# Patient Record
Sex: Male | Born: 1955 | Race: White | Hispanic: No | Marital: Married | Smoking: Never smoker
Health system: Southern US, Academic
[De-identification: ages and names within clinical notes are randomized; demographics above are authoritative.]

## PROBLEM LIST (undated history)

## (undated) DIAGNOSIS — I10 Essential (primary) hypertension: Secondary | ICD-10-CM

## (undated) DIAGNOSIS — E785 Hyperlipidemia, unspecified: Secondary | ICD-10-CM

## (undated) DIAGNOSIS — Z8709 Personal history of other diseases of the respiratory system: Secondary | ICD-10-CM

## (undated) DIAGNOSIS — M1990 Unspecified osteoarthritis, unspecified site: Secondary | ICD-10-CM

## (undated) DIAGNOSIS — K5792 Diverticulitis of intestine, part unspecified, without perforation or abscess without bleeding: Secondary | ICD-10-CM

## (undated) DIAGNOSIS — J45909 Unspecified asthma, uncomplicated: Secondary | ICD-10-CM

## (undated) DIAGNOSIS — G4730 Sleep apnea, unspecified: Secondary | ICD-10-CM

## (undated) DIAGNOSIS — A630 Anogenital (venereal) warts: Secondary | ICD-10-CM

## (undated) DIAGNOSIS — T7840XA Allergy, unspecified, initial encounter: Secondary | ICD-10-CM

## (undated) DIAGNOSIS — Z8619 Personal history of other infectious and parasitic diseases: Secondary | ICD-10-CM

## (undated) DIAGNOSIS — C801 Malignant (primary) neoplasm, unspecified: Secondary | ICD-10-CM

## (undated) HISTORY — DX: Essential (primary) hypertension: I10

## (undated) HISTORY — DX: Hyperlipidemia, unspecified: E78.5

## (undated) HISTORY — DX: Unspecified osteoarthritis, unspecified site: M19.90

## (undated) HISTORY — DX: Diverticulitis of intestine, part unspecified, without perforation or abscess without bleeding: K57.92

## (undated) HISTORY — DX: Malignant (primary) neoplasm, unspecified: C80.1

## (undated) HISTORY — DX: Anogenital (venereal) warts: A63.0

## (undated) HISTORY — DX: Unspecified asthma, uncomplicated: J45.909

## (undated) HISTORY — DX: Personal history of other diseases of the respiratory system: Z87.09

## (undated) HISTORY — PX: CARPAL TUNNEL RELEASE: SHX101

## (undated) HISTORY — DX: Sleep apnea, unspecified: G47.30

## (undated) HISTORY — DX: Allergy, unspecified, initial encounter: T78.40XA

## (undated) HISTORY — DX: Personal history of other infectious and parasitic diseases: Z86.19

---

## 2000-12-25 ENCOUNTER — Ambulatory Visit (HOSPITAL_COMMUNITY): Payer: Self-pay

## 2012-09-08 ENCOUNTER — Other Ambulatory Visit (HOSPITAL_COMMUNITY): Payer: Self-pay | Admitting: FAMILY PRACTICE

## 2012-09-17 ENCOUNTER — Ambulatory Visit: Payer: BC Managed Care – PPO | Attending: FAMILY PRACTICE

## 2012-09-17 DIAGNOSIS — R0681 Apnea, not elsewhere classified: Secondary | ICD-10-CM | POA: Insufficient documentation

## 2012-09-17 DIAGNOSIS — G4710 Hypersomnia, unspecified: Secondary | ICD-10-CM | POA: Insufficient documentation

## 2012-09-17 DIAGNOSIS — R0609 Other forms of dyspnea: Secondary | ICD-10-CM | POA: Insufficient documentation

## 2012-09-18 NOTE — Progress Notes (Signed)
PSG Charges Filed

## 2012-09-26 NOTE — Procedures (Signed)
 [place]  [facility]                                         Sleep Laboratory                                           [address]                                    [place], [place] [address]                                          [phone]                                       PATIENT NAME:    Christopher Mcknight, Christopher SR.                   HOSPITAL NUMBER: [id number]  DATE OF BIRTH:   07/05/1956    DATE OF SERVICE: 09/17/2012      September 25, 2012    Alm Lease MD  [address]  [place], [place]  [address]    Dear Dr. Lease:    On the evening of September 17, 2012, Christopher Mcknight reported to the [facility] to undergo an all-night polysomnographic study.  He is a 56 year old, 67-inch, 218-pound male who is being referred in with a history of loud snoring and witnessed apneas.  He also reports symptoms of excessive daytime sleepiness and falling asleep at inappropriate times.  His symptoms have been going on for a number of years.  He has a past medical history of carpal tunnel syndrome, hypertension and allergies.  His medications at the time of the sleep study included lisinopril/HCTZ, etodolac, simvastatin, Claritin, aspirin, vitamin D, vitamin C and a multivitamin.  He does not smoke or drink alcoholic beverages.  Caffeine intake is 3-5 cups a day each of coffee and cola.  Typical bedtime for him is 10:00 p.m. and he usually falls asleep within 10 minutes.  He usually gets out of bed at 4:20 in the morning.  He did not indicate that he takes naps during the day.  He had a score of 12 on the Epworth scale which would indicate moderate problems with sleepiness.  On a routinely administered depression questionnaire, he did not endorse a significant degree of depressive-related symptoms.    Technique:  This polysomnography consisted of the following recorded channels:  Frontal, central and occipital EEG, chin EMG, ROC and  LOC, right and left anterior tibial, EKG, snoring microphone, nasal/oral airflow, thoracic effort, abdominal effort, oxygen saturation and body position.    Study Results:  On the evening of his sleep study, Christopher Mcknight was provided with a 373-minute sleep opportunity of which he actually slept 339 minutes.  Sleep efficiency was 90.9%.  Sleep latency was 21 minutes and REM latency was 50 minutes.  Once asleep, he spent 7.2% of the time in stage N1, 68% stage N2, 0%  stage N3 and 24.7% REM.    There were 22 apneas recorded, of which 19 were obstructive and 3 were central.  There were also 76 obstructive hypopneas recorded.  His apnea-hypopnea index for the evening was moderately elevated at 17.32 events per hour of sleep.  During REM sleep, the index was severely elevated at 51.43 events per hour.  His respiratory events were associated with oxygen desaturations that went down to 78%.  He spent 7.87 minutes with oxygen desaturations that were below 90%.    There were 49 myoclonic events observed that had a periodic quality to their occurrence.  He had a periodic limb movement index of 8.7 events per hour of sleep, which is within normal limits.  In the EKG tracing, sinus rhythm was noted throughout.  Maximum heart rate while asleep was 84 beats per minute and minimum heart rate was 55 beats per minute.  There were no significant EEG abnormalities noted.  No parasomnias or abnormal behaviors were observed.    Clinical Interpretation:  The results of [name] all-night polysomnographic study confirms he does have a significant problem with sleep disordered breathing.  He has a moderately-elevated apnea-hypopnea index for the evening as a whole at 17.32 events per hour of sleep and with his respiratory events, he had significant oxygen desaturations down to 78%.  His REM index was severely elevated at 51.43 events per hour.  On the basis of these findings and in conjunction with his clinical picture, I am  going to suggest that Mr. Mckay return for a trial of positive airway pressure therapy.    Thank you for referring Christopher Mcknight to the [facility] and I will be notifying you of the results of his followup sleep study when it is completed.    Sincerely,        Norleen Salt, MD  Director, Sleep Laboratory  [facility] Department of Neurology and Psychiatry    [id number]; D: 09/25/2012 12:32:02; T: 09/26/2012 05:27:26

## 2012-10-12 ENCOUNTER — Telehealth (HOSPITAL_BASED_OUTPATIENT_CLINIC_OR_DEPARTMENT_OTHER): Payer: Self-pay | Admitting: Neurology

## 2012-10-13 ENCOUNTER — Other Ambulatory Visit (HOSPITAL_COMMUNITY): Payer: Self-pay | Admitting: FAMILY PRACTICE

## 2012-11-06 ENCOUNTER — Ambulatory Visit: Payer: BC Managed Care – PPO | Attending: FAMILY PRACTICE

## 2012-11-06 DIAGNOSIS — G4733 Obstructive sleep apnea (adult) (pediatric): Secondary | ICD-10-CM | POA: Insufficient documentation

## 2012-11-22 NOTE — Procedures (Signed)
[facility]                                         Sleep Laboratory                                           [address]                                    [place], [place] [address]                                          [id number]                                       PATIENT NAME:    Christopher Mcknight, Christopher SR.                   HOSPITAL NUMBER: [id number]  DATE OF BIRTH:   1955/12/01    DATE OF SERVICE: 11/06/2012      November 22, 2012    Blain Pais MD  [address]  [place], [place]  [address]    Dear Dr. Hessie Diener    On the evening of November 06, 2012, Melburn Hake returned to the [facility] to undergo his followup sleep study.  We originally studied him on September 17, 2012, and the results of that sleep study showed an apnea-hypopnea index of 7.32 events per hour of sleep for the evening with a more severe elevation in REM sleep at 51.43 events per hour.  Respiratory events were associated with oxygen desaturations down to 78%.  He is now returning for a trial of positive airway pressure therapy    Technique: This polysomnography consisted of the following recorded channels: Frontal, central and occipital EEG, chin EMG, ROC and LOC, right and left anterior tibial, EKG, snoring microphone, nasal/oral airflow, thoracic effort, abdominal effort, body position, and oxygen saturation.  This study was performed using the 4% oxygen desaturation criterion for the scoring of hypopneas.    STUDY RESULTS:  On the evening of his followup sleep study, Mr. Weiss was provided with a 381-minute sleep opportunity of which he actually slept 332 minutes.  Sleep efficiency was 89.4%.  Sleep latency was 4 minutes and REM latency was 51 minutes.  For the evening, he spent 6.3% of the time in stage N1, 65.7% stage N2, 0% stage N3 and 19.1% REM; 8.9% of time was spent awake.     For the evening, there was a total of 1 central apnea recorded and 6 obstructive hypopneas recorded.  The apnea-hypopnea index for the evening was within normal limits at 1.27 events per hour of sleep.  REM AHI was also normal at 3.45 events per hour.  Lowest oxygen saturation with a respiratory event was 88%.  He was started on CPAP and ended up with a final CPAP pressure setting of 8 cm.  We studied him for 143 minutes on CPAP at 8 cm;  34.2% of the time spent while on CPAP at 8 cm was spent in REM sleep.  The apnea-hypopnea index for the period of time he was on CPAP at 8 cm was 0.47 events per hour of sleep.  No significant oxygen desaturations were noted while on CPAP at 8 cm.    There were no myoclonic events observed that had a periodic quality to their occurrence.  He had a periodic limb movement index of 0.  In the EKG tracing, maximum heart rate while asleep was 79 beats per minute and minimum heart rate was 54 beats per minute.  There were no significant EEG abnormalities noted.  No parasomnias or abnormal behaviors were observed.    CLINICAL INTERPRETATION:  The results of Braedin Dungan's followup sleep study indicate that CPAP at a pressure setting of 8 cm will be effective in normalizing his apnea-hypopnea index.  I am going to prescribe him a CPAP machine with a pressure setting of 8 cm and suggest he have heated humidity and that he use a large Quattro full-face mask.  A followup appointment will be arranged for him in the sleep disorders clinic roughly 1-2 months after he has received his machine so that we can check on his progress.    Thank you for referring Tennis Mckinnon to the [facility] and please do not hesitate to contact me if you have any questions or comments regarding his evaluation.    Sincerely,        Leonard Downing, MD  Director, Sleep Laboratory  [facility] Department of Neurology and Psychiatry     [id number]; D: 11/22/2012 14:53:53; T: 11/22/2012 20:48:16

## 2012-11-23 ENCOUNTER — Encounter (HOSPITAL_BASED_OUTPATIENT_CLINIC_OR_DEPARTMENT_OTHER): Payer: Self-pay | Admitting: Neurology

## 2012-11-24 NOTE — Progress Notes (Signed)
Called pt and read the results of the study done on 11/06/12.  He understood the results and has no questions at this time,  He has no DME of choice but would like to the cpap to use at home.  L Caitland Porchia, RPSGT

## 2012-11-30 ENCOUNTER — Encounter (HOSPITAL_COMMUNITY): Payer: Self-pay | Admitting: FAMILY PRACTICE

## 2012-11-30 NOTE — Progress Notes (Signed)
Pt's CPAP machine was ordered on 11/30/2012 from:        [facility]

## 2013-02-16 ENCOUNTER — Ambulatory Visit (HOSPITAL_BASED_OUTPATIENT_CLINIC_OR_DEPARTMENT_OTHER): Payer: BC Managed Care – PPO | Admitting: Neurology

## 2013-02-16 ENCOUNTER — Encounter (HOSPITAL_BASED_OUTPATIENT_CLINIC_OR_DEPARTMENT_OTHER): Payer: Self-pay | Admitting: Neurology

## 2013-02-16 VITALS — BP 133/91 | HR 64 | Temp 98.3°F | Wt 205.9 lb

## 2013-03-22 ENCOUNTER — Encounter (HOSPITAL_BASED_OUTPATIENT_CLINIC_OR_DEPARTMENT_OTHER): Payer: Self-pay | Admitting: Neurology

## 2013-03-22 NOTE — Progress Notes (Signed)
Christopher Mcknight called [place] today to say that he likes the [employer] FFM  in medium size that he was given to try and wants [place] to order it from his DME [facility]  Shawn Stall

## 2013-03-23 ENCOUNTER — Encounter (HOSPITAL_BASED_OUTPATIENT_CLINIC_OR_DEPARTMENT_OTHER): Payer: BC Managed Care – PPO | Admitting: Neurology

## 2013-03-30 ENCOUNTER — Encounter (HOSPITAL_BASED_OUTPATIENT_CLINIC_OR_DEPARTMENT_OTHER): Payer: BC Managed Care – PPO | Admitting: Neurology

## 2013-04-30 ENCOUNTER — Encounter (HOSPITAL_BASED_OUTPATIENT_CLINIC_OR_DEPARTMENT_OTHER): Payer: BC Managed Care – PPO | Admitting: Neurology

## 2013-05-25 ENCOUNTER — Encounter (HOSPITAL_BASED_OUTPATIENT_CLINIC_OR_DEPARTMENT_OTHER): Payer: Self-pay

## 2013-08-17 ENCOUNTER — Ambulatory Visit (HOSPITAL_BASED_OUTPATIENT_CLINIC_OR_DEPARTMENT_OTHER): Payer: No Typology Code available for payment source | Admitting: Internal Medicine

## 2013-08-17 VITALS — BP 140/90 | HR 83 | Temp 98.2°F | Ht 69.0 in | Wt 210.0 lb

## 2013-08-17 NOTE — Progress Notes (Signed)
[facility]                              DEPARTMENT OF MEDICINE                                [place], [place] [address]                                PATIENT NAME: Christopher Mcknight, Christopher Mcknight.  HOSPITAL UEAVWU:[id number]  DATE OF SERVICE:08/17/2013  DATE OF BIRTH: June 20, 1956    PROGRESS NOTE    August 17, 2013    Blain Pais MD  [address]  [place], [place] [address]    Dear Dr. Hessie Diener:    Mr. Stclair was seen again in the Sleep Disorders Clinic at [facility].  As you recall, he had a sleep study about 18 months ago, which showed an all-night apnea-hypopnea index of 17 events per hour of sleep and the use of CPAP at 8 cm was effective in normalizing his apnea-hypopnea index.  He is seen in followup for this disorder and doing well.      On exam, his blood pressure is 140/90; his height is 5 feet 9 inches tall.  He weighs 210 pounds.  His BMI is 31.  He has no inspiratory crackles or expiratory wheeze.  His cardiac exam is without murmurs, gallop or rub and he has no lower extremity edema.      He works in Event organiser at [employer] and the use of CPAP at 8 cm has improved his nighttime snoring and daytime sleepiness.  We will continue this therapy and see him back in clinic in about a year.      I hope this information is valuable in his management and care.      Sincerely,      Drexel Iha, MD  Professor & Chief, Section of Pulmonary and Critical Care  [place] Department of Medicine    [id number]; D: 08/17/2013 09:35:38; T: 08/17/2013 13:07:00    cc: [facility]       [address] [place], [place] [address]            Blain Pais MD      [address]       [place], [place] [address]

## 2013-08-17 NOTE — Progress Notes (Signed)
 See dictated note.      osa      ICD-9-CM   1. OSA on CPAP 327.23         BP 140/90  Pulse 83  Temp(Src) 36.8 C (98.2 F)  Ht 1.753 m (5' 9)  Wt 95.255 kg (210 lb)  BMI 31 kg/m2  SpO2 98%  Chest clear cor reg        ICD-9-CM   1. OSA on CPAP 327.23       rtc one year    Juelz Claar

## 2013-08-18 ENCOUNTER — Encounter (HOSPITAL_BASED_OUTPATIENT_CLINIC_OR_DEPARTMENT_OTHER): Payer: BC Managed Care – PPO | Admitting: Internal Medicine

## 2013-08-19 ENCOUNTER — Encounter (HOSPITAL_BASED_OUTPATIENT_CLINIC_OR_DEPARTMENT_OTHER): Payer: BC Managed Care – PPO | Admitting: Neurology

## 2013-09-12 ENCOUNTER — Other Ambulatory Visit: Payer: Self-pay

## 2013-10-01 ENCOUNTER — Other Ambulatory Visit (HOSPITAL_COMMUNITY): Payer: Self-pay | Admitting: FAMILY PRACTICE

## 2013-10-20 ENCOUNTER — Other Ambulatory Visit (INDEPENDENT_AMBULATORY_CARE_PROVIDER_SITE_OTHER): Payer: Self-pay

## 2013-10-28 ENCOUNTER — Encounter (INDEPENDENT_AMBULATORY_CARE_PROVIDER_SITE_OTHER): Payer: Self-pay | Admitting: Anesthesiology

## 2013-10-28 ENCOUNTER — Ambulatory Visit: Payer: No Typology Code available for payment source | Attending: Anesthesiology | Admitting: Anesthesiology

## 2013-10-28 VITALS — BP 153/94 | HR 97 | Temp 97.9°F | Resp 20 | Ht 68.35 in | Wt 215.4 lb

## 2013-10-28 DIAGNOSIS — M542 Cervicalgia: Secondary | ICD-10-CM | POA: Insufficient documentation

## 2013-10-28 DIAGNOSIS — M79609 Pain in unspecified limb: Secondary | ICD-10-CM | POA: Insufficient documentation

## 2013-10-28 DIAGNOSIS — M5412 Radiculopathy, cervical region: Secondary | ICD-10-CM | POA: Insufficient documentation

## 2013-10-28 NOTE — Progress Notes (Addendum)
Subjective:     Patient ID:  Christopher Mess Sr. is an 57 y.o. male   Chief Complaint:    Chief Complaint   Patient presents with    Neck Pain     6-7 weeks- no incident- no surgery    Left Arm Pain     numbness and tingling     Patient reporting radicular pain consistent with C6-C7 nerve symptoms. Today he reports the pain is 2/10. 1 week ago he completed an oral prednisone taper and reports significant improvement since that time. Intra-muscular steroids did not work in the past. He reports that he has mild tingling in his 1st 3 fingers at this time, and the aching pain in his arm is almost completely gone.      Patient is a 57 y.o. male presenting with neck pain. The history is provided by the patient.   Neck Pain   This is a new problem. The current episode started more than 1 month ago. The problem occurs constantly. The problem has been gradually improving. The pain is associated with lifting a heavy object. The pain is present in the left side. The quality of the pain is described as aching. The pain is at a severity of 2/10. The pain is mild. Nothing aggravates the symptoms. The pain is same all the time. Associated symptoms include numbness and tingling. He has tried acetaminophen, NSAIDs, chiropractic manipulation and home exercises (intramuscular and oral steroids) for the symptoms. The treatment provided significant relief.       Review of Systems   Musculoskeletal: Positive for neck pain.   Neurological: Positive for tingling and numbness.   All other systems reviewed and are negative.      BP 153/94   Pulse 97   Temp(Src) 36.6 C (97.9 F)   Resp 20   Ht 1.736 m (5' 8.35")   Wt 97.7 kg (215 lb 6.2 oz)   BMI 32.42 kg/m2   SpO2 100%  Past Medical History  Current Outpatient Prescriptions   Medication Sig    ascorbic acid (VITAMIN C) 500 mg Oral Tablet Take 500 mg by mouth Once a day    aspirin (ECOTRIN) 81 mg Oral Tablet, Delayed Release (E.C.) Take 81 mg by mouth Once a day    cholecalciferol,  vitamin D3, 1,000 unit Oral Tablet Take 1,000 Units by mouth Once a day    gabapentin (NEURONTIN) 300 mg Oral Capsule Take 300 mg by mouth    Lisinopril-Hydrochlorothiazide (ZESTORETIC) 10-12.5 mg Oral Tablet Take 1 Tab by mouth Once a day    loratadine (CLARITIN) 10 mg Oral Tablet Take 10 mg by mouth Once a day    multivitamin Oral Tablet Take 1 Tab by mouth Once a day    niacin (NIASPAN) 500 mg Oral Tablet Sustained Release Take 1,000 mg by mouth Once a day    Omega-3 Fatty Acids-Vitamin E (FISH OIL) 1,000 mg Oral Capsule Take 1,000 mg by mouth Twice daily     Allergies   Allergen Reactions    Penicillins      Past Medical History   Diagnosis Date    HTN (hypertension)     Hyperlipidemia      Social History  Works at Research scientist (medical) for [employer]      Objective:   Physical Exam   Constitutional: He is oriented to person, place, and time. He appears well-developed and well-nourished.   HENT:   Head: Normocephalic and atraumatic.   Eyes: Pupils are equal, round,  and reactive to light.   Neck: Normal range of motion.   Pulm:  Effort normal.    Ortho/Musculoskeletal:           Back Exam   Tenderness   The patient is experiencing tenderness in the pain in left arm with palpation of cervical spine area(s).  Range of Motion   Flexion:                Normal  Extension:                Normal  Lateral Bend Left:     Lateral Bend Right:   Rotation Right:          Rotation Left:            Reflexes   Triceps:  4/4  Comments:  Grip strength and DTR's in Bilat UE normal. Paresthesia in left arm produced with palpation of cervical spine at level of C6. No point tenderness  Neurological: He is alert and oriented to person, place, and time.   Skin: Skin is warm and dry.   Psychiatric: He has a normal mood and affect.     .    Assessment & Plan:       ICD-9-CM   1. Neck pain 723.1   2. Cervical radiculopathy at C6 723.4       Pleasant 57yo male with left sided cervical radiculopthy    MRI c-spine reviewed and discussed with  patient  Patient showed significant improvement of radiculopathy with oral steroid taper   He does not need a cervical epidural steroid injection at this particular time  Risks and benefits of continued oral steroid therapy reviewed  Instructions given to patient that if left arm pain should return, he should call Dr. Joylene John clinic directly to be scheduled for cervical epidural steroid injection  Given instructions that he may return to work with no restrictions    Maylon Peppers, MD 10/28/2013, 9:39 AM    See resident/midlevel note for details. I reviewed care and evaluated the patient's management and agree with the resident's/midlevel's findings and plan as written except as noted.      Christopher Comp MD 10/28/2013, 9:39 AM

## 2014-08-23 ENCOUNTER — Encounter (HOSPITAL_BASED_OUTPATIENT_CLINIC_OR_DEPARTMENT_OTHER): Payer: No Typology Code available for payment source | Admitting: Internal Medicine

## 2014-08-24 ENCOUNTER — Encounter (HOSPITAL_BASED_OUTPATIENT_CLINIC_OR_DEPARTMENT_OTHER): Payer: No Typology Code available for payment source | Admitting: Internal Medicine

## 2014-08-25 ENCOUNTER — Other Ambulatory Visit: Payer: Self-pay

## 2014-08-29 ENCOUNTER — Encounter (HOSPITAL_BASED_OUTPATIENT_CLINIC_OR_DEPARTMENT_OTHER): Payer: No Typology Code available for payment source | Admitting: DERMATOLOGY

## 2014-11-08 ENCOUNTER — Encounter (HOSPITAL_BASED_OUTPATIENT_CLINIC_OR_DEPARTMENT_OTHER): Payer: Self-pay | Admitting: DERMATOLOGY

## 2014-11-08 ENCOUNTER — Ambulatory Visit: Payer: No Typology Code available for payment source | Attending: DERMATOLOGY | Admitting: DERMATOLOGY

## 2014-11-08 VITALS — BP 121/71 | Ht 67.68 in | Wt 217.6 lb

## 2014-11-08 DIAGNOSIS — D1800 Hemangioma unspecified site: Secondary | ICD-10-CM | POA: Insufficient documentation

## 2014-11-08 DIAGNOSIS — L570 Actinic keratosis: Secondary | ICD-10-CM | POA: Insufficient documentation

## 2014-11-08 DIAGNOSIS — D229 Melanocytic nevi, unspecified: Secondary | ICD-10-CM | POA: Insufficient documentation

## 2014-11-08 DIAGNOSIS — Z7982 Long term (current) use of aspirin: Secondary | ICD-10-CM | POA: Insufficient documentation

## 2014-11-08 DIAGNOSIS — I10 Essential (primary) hypertension: Secondary | ICD-10-CM | POA: Insufficient documentation

## 2014-11-08 NOTE — Procedures (Signed)
See progress note.   Christopher Bost [name], MD

## 2014-11-08 NOTE — Progress Notes (Signed)
Subjective:       Patient ID: Christopher Jarred Sr. is a 58 y.o. male     Chief Complaint:     Chief Complaint   Patient presents with    Skin Check     spots on face        HPI  Here to have lesions on face checked.  No bleeding or painful lesions.  No personal or family hx skin cancer.  Careful in the sun    Current Outpatient Prescriptions   Medication Sig    ascorbic acid (VITAMIN C) 500 mg Oral Tablet Take 500 mg by mouth Once a day    aspirin (ECOTRIN) 81 mg Oral Tablet, Delayed Release (E.C.) Take 81 mg by mouth Once a day    atorvastatin (LIPITOR) 20 mg Oral Tablet Take 20 mg by mouth Once a day    cholecalciferol, vitamin D3, 1,000 unit Oral Tablet Take 1,000 Units by mouth Once a day    gabapentin (NEURONTIN) 300 mg Oral Capsule Take 300 mg by mouth    Lisinopril-Hydrochlorothiazide (ZESTORETIC) 10-12.5 mg Oral Tablet Take 1 Tab by mouth Once a day    Lisinopril-Hydrochlorothiazide (ZESTORETIC) 20-12.5 mg Oral Tablet Take 1 Tab by mouth Twice daily    loratadine (CLARITIN) 10 mg Oral Tablet Take 10 mg by mouth Once a day    multivitamin Oral Tablet Take 1 Tab by mouth Once a day    niacin (NIASPAN) 500 mg Oral Tablet Sustained Release Take 1,000 mg by mouth Once a day    Omega-3 Fatty Acids-Vitamin E (FISH OIL) 1,000 mg Oral Capsule Take 1,000 mg by mouth Twice daily       Review of Systems   Constitutional: Negative for fever.   Cardiovascular: Negative for chest pain.   Gastrointestinal: Negative for diarrhea.       Objective:   Marland Kitchen BP 121/71 mmHg   Ht 1.719 m (5' 7.68")   Wt 98.7 kg (217 lb 9.5 oz)   BMI 33.40 kg/m2    Physical Exam   Constitutional: He is oriented to person, place, and time. He appears well-developed and well-nourished. No distress.   HENT:   Head: Normocephalic and atraumatic.   Neurological: He is alert and oriented to person, place, and time.   Skin: Skin is warm and dry. He is not diaphoretic.        Psychiatric: He has a normal mood and affect.   General skin exam was  performed including head, neck, anterior/posterior trunk, bilateral upper & lower extremities and revealed no areas of concern other than those documented.      Assessment & Plan:       ICD-10-CM    1. Actinic keratosis L57.0 DESTRUCTION OF LESIONS (CRYO/EXC) PRE-MALIG (AMB ONLY)   2. Angioma D18.00    3. Nevus D22.9    4. Essential hypertension I10            Plan  Procedure:  Freeze thaw freeze AK right cheekLesion(s) treated with freeze/thaw/refreeze with liquid nitrogen.  Patient was informed of the risks of the procedure including pain, irritation, blister, hypopigmentation, hyperpigmentation, and recurrence. Patient understood the risks and agrees with the procedure.    Follow  The patient was educated on the importance of avoiding excessive sun exposure and wearing sunscreen daily.  Advised patient to re-apply sunscreen every 2-3 hours.  Advised the patient to avoid going to the tanning beds.  Advised to check skin routinely for any changes, especially any new moles  or changes in existing moles.  Mauri Reading, MD

## 2015-02-01 ENCOUNTER — Ambulatory Visit: Payer: No Typology Code available for payment source | Attending: DERMATOLOGY | Admitting: DERMATOLOGY

## 2015-02-01 ENCOUNTER — Encounter (HOSPITAL_BASED_OUTPATIENT_CLINIC_OR_DEPARTMENT_OTHER): Payer: Self-pay | Admitting: DERMATOLOGY

## 2015-02-01 VITALS — BP 128/78 | Ht 68.15 in | Wt 215.2 lb

## 2015-02-01 DIAGNOSIS — Z7982 Long term (current) use of aspirin: Secondary | ICD-10-CM | POA: Insufficient documentation

## 2015-02-01 DIAGNOSIS — L570 Actinic keratosis: Secondary | ICD-10-CM | POA: Insufficient documentation

## 2015-02-01 DIAGNOSIS — A630 Anogenital (venereal) warts: Secondary | ICD-10-CM | POA: Insufficient documentation

## 2015-02-01 DIAGNOSIS — D229 Melanocytic nevi, unspecified: Secondary | ICD-10-CM | POA: Insufficient documentation

## 2015-02-01 DIAGNOSIS — Z6832 Body mass index (BMI) 32.0-32.9, adult: Secondary | ICD-10-CM | POA: Insufficient documentation

## 2015-02-01 MED ORDER — IMIQUIMOD 5 % TOPICAL CREAM PACKET
1.00 | TOPICAL_CREAM | CUTANEOUS | Status: AC
Start: 2015-02-01 — End: ?

## 2015-02-01 NOTE — Procedures (Signed)
See progress note.   Christopher Mcknight L [name], MD

## 2015-02-01 NOTE — Progress Notes (Signed)
Subjective:       Patient ID: Christopher Jarred Sr. is a 59 y.o. male     Chief Complaint:     Chief Complaint   Patient presents with    Skin Check     genital warts        HPI    Here to have genital warts checked.  First appeared about 5 months ago.  PCP has frozen and treated with podophylin.  Now on imiquamod for about 2 months.  Some have resolved.  No new bleeding or painful lesions.Hx AKs.  No personal or family hx skin cancer.  Careful in the sun    Current Outpatient Prescriptions   Medication Sig    ascorbic acid (VITAMIN C) 500 mg Oral Tablet Take 500 mg by mouth Once a day    aspirin (ECOTRIN) 81 mg Oral Tablet, Delayed Release (E.C.) Take 81 mg by mouth Once a day    atorvastatin (LIPITOR) 20 mg Oral Tablet Take 20 mg by mouth Once a day    cholecalciferol, vitamin D3, 1,000 unit Oral Tablet Take 1,000 Units by mouth Once a day    gabapentin (NEURONTIN) 300 mg Oral Capsule Take 300 mg by mouth    Imiquimod 5 % Cream in Packet Apply 1 Applicator topically Every Monday, Wednesday and Friday apply a thin layer as directed    Lisinopril-Hydrochlorothiazide (ZESTORETIC) 10-12.5 mg Oral Tablet Take 1 Tab by mouth Once a day    Lisinopril-Hydrochlorothiazide (ZESTORETIC) 20-12.5 mg Oral Tablet Take 1 Tab by mouth Twice daily    loratadine (CLARITIN) 10 mg Oral Tablet Take 10 mg by mouth Once a day    multivitamin Oral Tablet Take 1 Tab by mouth Once a day    niacin (NIASPAN) 500 mg Oral Tablet Sustained Release Take 1,000 mg by mouth Once a day    Omega-3 Fatty Acids-Vitamin E (FISH OIL) 1,000 mg Oral Capsule Take 1,000 mg by mouth Twice daily       Review of Systems   Constitutional: Negative for fever.   Cardiovascular: Negative for chest pain.   Gastrointestinal: Negative for diarrhea.       Objective:   Marland Kitchen   BP 128/78 mmHg   Ht 1.731 m (5' 8.15")   Wt 97.6 kg (215 lb 2.7 oz)   BMI 32.57 kg/m2    Physical Exam   Constitutional: He is oriented to person, place, and time. He appears  well-developed and well-nourished. No distress.   HENT:   Head: Normocephalic and atraumatic.   Neurological: He is alert and oriented to person, place, and time.   Skin: Skin is warm and dry. He is not diaphoretic.        Psychiatric: He has a normal mood and affect.   General skin exam was performed including head, neck, anterior/posterior trunk, bilateral upper & lower extremities and revealed no areas of concern other than those documented.      Assessment & Plan:       ICD-10-CM    1. Genital warts A63.0 GENITAL WARTS (CRYO) ANAL-SIMPLE (AMB ONLY)   2. Actinic keratosis L57.0    3. Nevus D22.9            Plan    Procedure:  Freeze thaw freeze 6 warts Lesion(s) treated with freeze/thaw/refreeze with liquid nitrogen.  Patient was informed of the risks of the procedure including pain, irritation, blister, hypopigmentation, hyperpigmentation, and recurrence. Patient understood the risks and agrees with the procedure.  Continue imiquamod  Follow  The patient was educated on the importance of avoiding excessive sun exposure and wearing sunscreen daily.  Advised patient to re-apply sunscreen every 2-3 hours.  Advised the patient to avoid going to the tanning beds.  Advised to check skin routinely for any changes, especially any new moles or changes in existing moles.  Mauri Reading, MD

## 2015-05-16 ENCOUNTER — Encounter (HOSPITAL_BASED_OUTPATIENT_CLINIC_OR_DEPARTMENT_OTHER): Payer: No Typology Code available for payment source | Admitting: DERMATOLOGY

## 2015-10-22 ENCOUNTER — Other Ambulatory Visit: Payer: Self-pay

## 2016-09-09 ENCOUNTER — Ambulatory Visit: Payer: Self-pay | Admitting: Family Medicine

## 2016-09-18 DIAGNOSIS — K5792 Diverticulitis of intestine, part unspecified, without perforation or abscess without bleeding: Secondary | ICD-10-CM

## 2016-09-18 HISTORY — DX: Diverticulitis of intestine, part unspecified, without perforation or abscess without bleeding: K57.92

## 2016-09-20 DIAGNOSIS — K5792 Diverticulitis of intestine, part unspecified, without perforation or abscess without bleeding: Secondary | ICD-10-CM | POA: Diagnosis not present

## 2016-09-20 DIAGNOSIS — R1032 Left lower quadrant pain: Secondary | ICD-10-CM | POA: Diagnosis not present

## 2016-09-26 ENCOUNTER — Telehealth: Payer: Self-pay | Admitting: *Deleted

## 2016-09-26 ENCOUNTER — Encounter: Payer: Self-pay | Admitting: *Deleted

## 2016-09-26 NOTE — Telephone Encounter (Signed)
Pre-Visit Call completed with patient and chart updated.   Pre-Visit Info documented in Specialty Comments under [employer].    

## 2016-09-27 ENCOUNTER — Encounter: Payer: Self-pay | Admitting: Family Medicine

## 2016-09-27 ENCOUNTER — Ambulatory Visit (INDEPENDENT_AMBULATORY_CARE_PROVIDER_SITE_OTHER): Payer: BLUE CROSS/BLUE SHIELD | Admitting: Family Medicine

## 2016-09-27 ENCOUNTER — Ambulatory Visit: Payer: Self-pay | Admitting: Family Medicine

## 2016-09-27 VITALS — BP 122/76 | HR 62 | Temp 98.1°F | Ht 68.0 in | Wt 221.4 lb

## 2016-09-27 DIAGNOSIS — Z1211 Encounter for screening for malignant neoplasm of colon: Secondary | ICD-10-CM | POA: Diagnosis not present

## 2016-09-27 DIAGNOSIS — K5732 Diverticulitis of large intestine without perforation or abscess without bleeding: Secondary | ICD-10-CM

## 2016-09-27 DIAGNOSIS — Z125 Encounter for screening for malignant neoplasm of prostate: Secondary | ICD-10-CM | POA: Diagnosis not present

## 2016-09-27 DIAGNOSIS — Z131 Encounter for screening for diabetes mellitus: Secondary | ICD-10-CM

## 2016-09-27 DIAGNOSIS — I10 Essential (primary) hypertension: Secondary | ICD-10-CM | POA: Diagnosis not present

## 2016-09-27 DIAGNOSIS — E785 Hyperlipidemia, unspecified: Secondary | ICD-10-CM

## 2016-09-27 HISTORY — DX: Hyperlipidemia, unspecified: E78.5

## 2016-09-27 LAB — LIPID PANEL
CHOLESTEROL: 151 mg/dL (ref 0–200)
HDL: 39.5 mg/dL (ref >39.00)
LDL Cholesterol: 88 mg/dL (ref 0–99)
NonHDL: 111.91
TRIGLYCERIDES: 122 mg/dL (ref 0.0–149.0)
Total CHOL/HDL Ratio: 4
VLDL: 24.4 mg/dL (ref 0.0–40.0)

## 2016-09-27 LAB — MICROALBUMIN / CREATININE URINE RATIO
Creatinine,U: 166.2 mg/dL
Microalb Creat Ratio: 0.4 mg/g (ref 0.0–30.0)

## 2016-09-27 LAB — COMPREHENSIVE METABOLIC PANEL
ALBUMIN: 4.3 g/dL (ref 3.5–5.2)
ALK PHOS: 37 U/L — AB (ref 39–117)
ALT: 31 U/L (ref 0–53)
AST: 31 U/L (ref 0–37)
BUN: 14 mg/dL (ref 6–23)
CALCIUM: 9.8 mg/dL (ref 8.4–10.5)
CO2: 31 mEq/L (ref 19–32)
Chloride: 104 mEq/L (ref 96–112)
Creatinine, Ser: 0.95 mg/dL (ref 0.40–1.50)
GFR: 86.11 mL/min (ref >60.00)
Glucose, Bld: 93 mg/dL (ref 70–99)
POTASSIUM: 4.2 meq/L (ref 3.5–5.1)
Sodium: 143 mEq/L (ref 135–145)
TOTAL PROTEIN: 6.7 g/dL (ref 6.0–8.3)
Total Bilirubin: 0.7 mg/dL (ref 0.2–1.2)

## 2016-09-27 LAB — HEMOGLOBIN A1C: Hgb A1c MFr Bld: 5.6 % (ref 4.6–6.5)

## 2016-09-27 LAB — PSA: PSA: 4.86 ng/mL — AB (ref 0.10–4.00)

## 2016-09-27 NOTE — Progress Notes (Signed)
Pre visit review using our clinic review tool, if applicable. No additional management support is needed unless otherwise documented below in the visit note. 

## 2016-09-27 NOTE — Progress Notes (Signed)
Chief Complaint  Patient presents with  . Establish Care    Pt states x1 week having discomfort in abdomen and went to urgent care and was diagnosed with diverticulitis        New Patient Visit SUBJECTIVE: HPI: Sean Bowers is an 60 y.o.male who is being seen for establishing care.  The patient was previously seen at an office in [place].   Seen at urgent care 1 week ago for LLQ abd pain. He was dx'd and treated for diverticulitis. His last day of abx, Flagyl and Cipro, is today. He is 80% better, but still has some LLQ discomfort. States his stool is a little darker than usual, he is not having any blood or tarry colored stools. He has never had a colonoscopy. No longer having fevers.  Hypertension Patient presents for hypertension follow up. He does not monitor home blood pressures. He is compliant with medications. On Prinzide 20-25 mg daily. Patient has these side effects of medication: none He specifically denies headache, visual changes, chest pain, palpitations, dyspnea, orthopnea, PND or peripheral edema. He is adhering to a low sodium and low fat diet.   Allergies  Allergen Reactions  . Penicillins Swelling    Swelling of lips    Past Medical History:  Diagnosis Date  . Arthritis   . Asthma   . Diverticulitis   . Genital warts   . History of chicken pox   . History of hay fever   . Hyperlipidemia   . Hyperlipidemia 09/27/2016  . Hypertension    Past Surgical History:  Procedure Laterality Date  . CARPAL TUNNEL RELEASE Bilateral 2006-2007   Social History   Social History  . Marital status: Married   Social History Main Topics  . Smoking status: Never Smoker  . Smokeless tobacco: Never Used  . Alcohol use No  . Drug use: No   Family History  Problem Relation Age of Onset  . Diabetes Mother   . Heart disease Mother   . Hypertension Mother   . Breast cancer Mother   . Arthritis Mother   . Cancer Mother     breast  . Hyperlipidemia Mother    . Heart disease Father   . Stroke Father   . Arthritis Father   . Cancer Father     prostate  . Hyperlipidemia Father   . Heart disease Paternal Grandmother   . Cancer Maternal Uncle     lung  . Cancer Paternal Aunt     lung  . Cancer Paternal Uncle     lung     Current Outpatient Prescriptions:  .  aspirin 81 MG tablet, Take 81 mg by mouth daily., Disp: , Rfl:  .  Chlorpheniramine Maleate (ALLERGY PO), Take by mouth as needed., Disp: , Rfl:  .  lisinopril-hydrochlorothiazide (PRINZIDE,ZESTORETIC) 20-25 MG tablet, Take 1 tablet by mouth daily., Disp: , Rfl:  .  Multiple Vitamins-Minerals (MULTIVITAMIN MEN PO), Take by mouth., Disp: , Rfl:  .  pravastatin (PRAVACHOL) 40 MG tablet, Take 40 mg by mouth daily., Disp: , Rfl:   ROS Cardiovascular: Denies chest pain  Respiratory: Denies dyspnea   OBJECTIVE: BP 122/76 (BP Location: Left Arm, Patient Position: Sitting, Cuff Size: Large)   Pulse 62   Temp 98.1 F (36.7 C) (Oral)   Ht 5\' 8"  (1.727 m)   Wt 221 lb 6.4 oz (100.4 kg)   SpO2 97%   BMI 33.66 kg/m   Constitutional: -  VS reviewed -  Well developed,  well nourished, appears stated age -  No apparent distress  Psychiatric: -  Oriented to person, place, and time -  Memory intact -  Affect and mood normal -  Fluent conversation, good eye contact -  Judgment and insight age appropriate  Eye: -  Conjunctivae clear, no discharge -  Pupils symmetric, round, reactive to light  ENMT: -  Oral mucosa without lesions, tongue and uvula midline    Tonsils not enlarged, no erythema, no exudate, trachea midline    Pharynx moist, no lesions, no erythema  Neck: -  No gross swelling, no palpable masses -  Thyroid midline, not enlarged, mobile, no palpable masses  Cardiovascular: -  RRR, no murmurs -  No LE edema  Respiratory: -  Normal respiratory effort, no accessory muscle use, no retraction -  Breath sounds equal, no wheezes, no ronchi, no crackles  Gastrointestinal: -  Bowel  sounds normal -  TTP in LLQ, no distention, no guarding, no masses  Musculoskeletal: -  No clubbing, no cyanosis -  Gait normal  Skin: -  No significant lesion on inspection -  Warm and dry to palpation   ASSESSMENT/PLAN: Essential hypertension - Plan: Comprehensive metabolic panel, Microalbumin / creatinine urine ratio  Hyperlipidemia, unspecified hyperlipidemia type - Plan: Comprehensive metabolic panel, Lipid panel  Diverticulitis of colon without hemorrhage  Special screening for malignant neoplasms, colon - Plan: Ambulatory referral to Gastroenterology  Screening for diabetes mellitus - Plan: Hemoglobin A1c  Screening for prostate cancer - Plan: PSA  Patient instructed to sign release of records form from his previous PCP. Orders as above. Progressing nicely on abx. Should see GI for CCS. Patient should return in 6 mo to recheck HTN. The patient voiced understanding and agreement to the plan.   [place], DO 09/27/16  8:27 AM

## 2016-10-02 ENCOUNTER — Telehealth: Payer: Self-pay | Admitting: Family Medicine

## 2016-10-02 ENCOUNTER — Encounter: Payer: Self-pay | Admitting: Gastroenterology

## 2016-10-02 NOTE — Telephone Encounter (Signed)
Caller name:Alyan Hanzlik Relationship to patient: Can be reached: Pharmacy:  Reason for call:Requesting lab results be released to [facility]

## 2016-10-17 NOTE — Telephone Encounter (Signed)
Outside records reviewed. Lab of note was a one time low reading of free testosterone with a low-normal total T 04/2014. Recheck on 09/2014 revealed T in normal range. Records do not show that he received treatment.

## 2016-11-03 ENCOUNTER — Other Ambulatory Visit: Payer: Self-pay

## 2016-11-26 ENCOUNTER — Ambulatory Visit (AMBULATORY_SURGERY_CENTER): Payer: Self-pay

## 2016-11-26 VITALS — Ht 68.0 in | Wt 222.6 lb

## 2016-11-26 DIAGNOSIS — Z1211 Encounter for screening for malignant neoplasm of colon: Secondary | ICD-10-CM

## 2016-11-26 MED ORDER — NA SULFATE-K SULFATE-MG SULF 17.5-3.13-1.6 GM/177ML PO SOLN: ORAL | 0 refills | Status: DC

## 2016-11-26 NOTE — Progress Notes (Signed)
Per pt, no allergies to soy or egg products.Pt not taking any weight loss meds or using  O2 at home. 

## 2016-12-02 ENCOUNTER — Encounter: Payer: Self-pay | Admitting: Gastroenterology

## 2016-12-10 ENCOUNTER — Ambulatory Visit (AMBULATORY_SURGERY_CENTER): Payer: BLUE CROSS/BLUE SHIELD | Admitting: Gastroenterology

## 2016-12-10 ENCOUNTER — Encounter: Payer: Self-pay | Admitting: Gastroenterology

## 2016-12-10 VITALS — BP 130/82 | HR 82 | Temp 98.6°F | Resp 16 | Ht 68.0 in | Wt 222.0 lb

## 2016-12-10 DIAGNOSIS — Z1212 Encounter for screening for malignant neoplasm of rectum: Secondary | ICD-10-CM | POA: Diagnosis not present

## 2016-12-10 DIAGNOSIS — K635 Polyp of colon: Secondary | ICD-10-CM

## 2016-12-10 DIAGNOSIS — Z1211 Encounter for screening for malignant neoplasm of colon: Secondary | ICD-10-CM

## 2016-12-10 DIAGNOSIS — D122 Benign neoplasm of ascending colon: Secondary | ICD-10-CM

## 2016-12-10 DIAGNOSIS — D120 Benign neoplasm of cecum: Secondary | ICD-10-CM

## 2016-12-10 MED ORDER — SODIUM CHLORIDE 0.9 % IV SOLN: 500.0000 mL | INTRAVENOUS | Status: DC

## 2016-12-10 NOTE — Progress Notes (Signed)
Called to room to assist during endoscopic procedure.  Patient ID and intended procedure confirmed with present staff. Received instructions for my participation in the procedure from the performing physician.  

## 2016-12-10 NOTE — Progress Notes (Signed)
Spontaneous respirations throughout. VSS. Resting comfortably. To PACU on room air. Report to  Celia RN. 

## 2016-12-10 NOTE — Patient Instructions (Signed)
Discharge instructions given. Handouts on polyps,diverticulosis and hemorrhoids. No ibuprofen,naproxen,or other non-steroidal anti-inflammatory drugs for 2 weeks. Resume previous medications. YOU HAD AN ENDOSCOPIC PROCEDURE TODAY AT THE [facility]:   Refer to the procedure report that was given to you for any specific questions about what was found during the examination.  If the procedure report does not answer your questions, please call your gastroenterologist to clarify.  If you requested that your care partner not be given the details of your procedure findings, then the procedure report has been included in a sealed envelope for you to review at your convenience later.  YOU SHOULD EXPECT: Some feelings of bloating in the abdomen. Passage of more gas than usual.  Walking can help get rid of the air that was put into your GI tract during the procedure and reduce the bloating. If you had a lower endoscopy (such as a colonoscopy or flexible sigmoidoscopy) you may notice spotting of blood in your stool or on the toilet paper. If you underwent a bowel prep for your procedure, you may not have a normal bowel movement for a few days.  Please Note:  You might notice some irritation and congestion in your nose or some drainage.  This is from the oxygen used during your procedure.  There is no need for concern and it should clear up in a day or so.  SYMPTOMS TO REPORT IMMEDIATELY:   Following lower endoscopy (colonoscopy or flexible sigmoidoscopy):  Excessive amounts of blood in the stool  Significant tenderness or worsening of abdominal pains  Swelling of the abdomen that is new, acute  Fever of 100F or higher   For urgent or emergent issues, a gastroenterologist can be reached at any hour by calling [phone].   DIET:  We do recommend a small meal at first, but then you may proceed to your regular diet.  Drink plenty of fluids but you should avoid alcoholic beverages for 24  hours.  ACTIVITY:  You should plan to take it easy for the rest of today and you should NOT DRIVE or use heavy machinery until tomorrow (because of the sedation medicines used during the test).    FOLLOW UP: Our staff will call the number listed on your records the next business day following your procedure to check on you and address any questions or concerns that you may have regarding the information given to you following your procedure. If we do not reach you, we will leave a message.  However, if you are feeling well and you are not experiencing any problems, there is no need to return our call.  We will assume that you have returned to your regular daily activities without incident.  If any biopsies were taken you will be contacted by phone or by letter within the next 1-3 weeks.  Please call us at [phone] if you have not heard about the biopsies in 3 weeks.    SIGNATURES/CONFIDENTIALITY: You and/or your care partner have signed paperwork which will be entered into your electronic medical record.  These signatures attest to the fact that that the information above on your After Visit Summary has been reviewed and is understood.  Full responsibility of the confidentiality of this discharge information lies with you and/or your care-partner. 

## 2016-12-10 NOTE — Op Note (Signed)
[facility] Patient Name: Sean Bowers Procedure Date: 12/10/2016 7:48 AM MRN: [id number] Endoscopist: Remo Lipps P. Elainna Eshleman MD, MD Age: 61 Referring MD:  Date of Birth: 1956/09/16 Gender: Male Account #: [id number] Procedure:                Colonoscopy Indications:              Screening for colorectal malignant neoplasm, This                            is the patient's first colonoscopy Medicines:                Monitored Anesthesia Care Procedure:                Pre-Anesthesia Assessment:                           - Prior to the procedure, a History and Physical                            was performed, and patient medications and                            allergies were reviewed. The patient's tolerance of                            previous anesthesia was also reviewed. The risks                            and benefits of the procedure and the sedation                            options and risks were discussed with the patient.                            All questions were answered, and informed consent                            was obtained. Prior Anticoagulants: The patient has                            taken aspirin, last dose was 1 day prior to                            procedure. ASA Grade Assessment: II - A patient                            with mild systemic disease. After reviewing the                            risks and benefits, the patient was deemed in                            satisfactory condition to undergo the procedure.  After obtaining informed consent, the colonoscope                            was passed under direct vision. Throughout the                            procedure, the patient's blood pressure, pulse, and                            oxygen saturations were monitored continuously. The                            CF-[id number] was introduced through the anus and                            advanced to the the  cecum, identified by                            appendiceal orifice and ileocecal valve. The                            colonoscopy was performed without difficulty. The                            patient tolerated the procedure well. The quality                            of the bowel preparation was good. The ileocecal                            valve, appendiceal orifice, and rectum were                            photographed. Scope In: 8:05:13 AM Scope Out: 8:23:38 AM Scope Withdrawal Time: 0 hours 16 minutes 29 seconds  Total Procedure Duration: 0 hours 18 minutes 25 seconds  Findings:                 The perianal and digital rectal examinations were                            normal.                           A 5 mm polyp was found in the ileocecal valve. The                            polyp was sessile. The polyp was removed with a                            cold snare. Resection and retrieval were complete.                           A 4 mm polyp was found in the distal ascending  colon. The polyp was sessile. The polyp was removed                            with a cold snare. Resection and retrieval were                            complete.                           Multiple medium-mouthed diverticula were found in                            the entire colon.                           Internal hemorrhoids were found during retroflexion.                           The exam was otherwise without abnormality. Complications:            No immediate complications. Estimated blood loss:                            Minimal. Estimated Blood Loss:     Estimated blood loss was minimal. Impression:               - One 5 mm polyp at the ileocecal valve, removed                            with a cold snare. Resected and retrieved.                           - One 4 mm polyp in the distal ascending colon,                            removed with a cold snare. Resected  and retrieved.                           - Diverticulosis in the entire examined colon.                           - Internal hemorrhoids.                           - The examination was otherwise normal. Recommendation:           - Patient has a contact number available for                            emergencies. The signs and symptoms of potential                            delayed complications were discussed with the                            patient. Return to normal activities tomorrow.  Written discharge instructions were provided to the                            patient.                           - Resume previous diet.                           - Continue present medications.                           - No ibuprofen, naproxen, or other non-steroidal                            anti-inflammatory drugs for 2 weeks after polyp                            removal.                           - Await pathology results.                           - Repeat colonoscopy is recommended for                            surveillance. The colonoscopy date will be                            determined after pathology results from today's                            exam become available for review. Remo Lipps P. Cassidey Barrales MD, MD 12/10/2016 8:27:48 AM This report has been signed electronically.

## 2016-12-11 ENCOUNTER — Telehealth: Payer: Self-pay | Admitting: *Deleted

## 2016-12-11 ENCOUNTER — Telehealth: Payer: Self-pay

## 2016-12-11 NOTE — Telephone Encounter (Signed)
  Follow up Call-  Call back number 12/10/2016  Post procedure Call Back phone  # [phone]  Permission to leave phone message Yes    Patient was called for follow up after his procedure on 12/10/2016. No answer at the number given for follow up phone call. A message was left on the answering machine.

## 2016-12-11 NOTE — Telephone Encounter (Signed)
Message left

## 2016-12-16 ENCOUNTER — Encounter: Payer: Self-pay | Admitting: Gastroenterology

## 2017-03-28 ENCOUNTER — Ambulatory Visit (INDEPENDENT_AMBULATORY_CARE_PROVIDER_SITE_OTHER): Payer: BLUE CROSS/BLUE SHIELD | Admitting: Family Medicine

## 2017-03-28 ENCOUNTER — Encounter: Payer: Self-pay | Admitting: Family Medicine

## 2017-03-28 VITALS — BP 120/68 | HR 65 | Temp 98.2°F | Ht 68.0 in | Wt 220.0 lb

## 2017-03-28 DIAGNOSIS — Z0000 Encounter for general adult medical examination without abnormal findings: Secondary | ICD-10-CM

## 2017-03-28 DIAGNOSIS — Z1159 Encounter for screening for other viral diseases: Secondary | ICD-10-CM | POA: Diagnosis not present

## 2017-03-28 DIAGNOSIS — Z114 Encounter for screening for human immunodeficiency virus [HIV]: Secondary | ICD-10-CM | POA: Diagnosis not present

## 2017-03-28 DIAGNOSIS — R972 Elevated prostate specific antigen [PSA]: Secondary | ICD-10-CM | POA: Diagnosis not present

## 2017-03-28 DIAGNOSIS — L989 Disorder of the skin and subcutaneous tissue, unspecified: Secondary | ICD-10-CM

## 2017-03-28 LAB — CBC
HEMATOCRIT: 43.8 % (ref 39.0–52.0)
HEMOGLOBIN: 15.2 g/dL (ref 13.0–17.0)
MCHC: 34.8 g/dL (ref 30.0–36.0)
MCV: 86.4 fl (ref 78.0–100.0)
Platelets: 184 10*3/uL (ref 150.0–400.0)
RBC: 5.06 Mil/uL (ref 4.22–5.81)
RDW: 13.1 % (ref 11.5–15.5)
WBC: 5.6 10*3/uL (ref 4.0–10.5)

## 2017-03-28 LAB — COMPREHENSIVE METABOLIC PANEL
ALBUMIN: 4.5 g/dL (ref 3.5–5.2)
ALT: 24 U/L (ref 0–53)
AST: 21 U/L (ref 0–37)
Alkaline Phosphatase: 36 U/L — ABNORMAL LOW (ref 39–117)
BUN: 16 mg/dL (ref 6–23)
CHLORIDE: 105 meq/L (ref 96–112)
CO2: 27 mEq/L (ref 19–32)
CREATININE: 0.94 mg/dL (ref 0.40–1.50)
Calcium: 9.7 mg/dL (ref 8.4–10.5)
GFR: 87.02 mL/min (ref >60.00)
GLUCOSE: 87 mg/dL (ref 70–99)
Potassium: 3.9 mEq/L (ref 3.5–5.1)
SODIUM: 141 meq/L (ref 135–145)
TOTAL PROTEIN: 6.8 g/dL (ref 6.0–8.3)
Total Bilirubin: 0.9 mg/dL (ref 0.2–1.2)

## 2017-03-28 LAB — LIPID PANEL
CHOLESTEROL: 228 mg/dL — AB (ref 0–200)
HDL: 44.5 mg/dL (ref >39.00)
LDL Cholesterol: 156 mg/dL — ABNORMAL HIGH (ref 0–99)
NONHDL: 183.29
Total CHOL/HDL Ratio: 5
Triglycerides: 135 mg/dL (ref 0.0–149.0)
VLDL: 27 mg/dL (ref 0.0–40.0)

## 2017-03-28 LAB — PSA: PSA: 3.58 ng/mL (ref 0.10–4.00)

## 2017-03-28 NOTE — Progress Notes (Signed)
Chief Complaint  Patient presents with  . Annual Exam    non-fasting    Well Male Sean Bowers is here for a complete physical.   His last physical was >1 year ago.  Current diet: in general, a "healthy" diet  Current exercise: Elliptical, some weight lifting; typically gets 4 days/week in Weight trend: steadily losing weight Does pt snore? No.  Daytime fatigue? No. Seat belt? Yes.    Health maintenance Shingrix- No  Colonoscopy- Yes Tetanus- Yes HIV- No Hep C- No Prostate cancer screening- Yes   Past Medical History:  Diagnosis Date  . Allergy   . Arthritis   . Asthma    no inhalers  . Diverticulitis 09/2016   seen in Urgent Care  . Genital warts   . History of chicken pox   . History of hay fever   . Hyperlipidemia   . Hyperlipidemia 09/27/2016  . Hypertension     Past Surgical History:  Procedure Laterality Date  . CARPAL TUNNEL RELEASE Bilateral 2006-2007   Medications  Current Outpatient Prescriptions on File Prior to Visit  Medication Sig Dispense Refill  . aspirin 81 MG tablet Take 81 mg by mouth daily.    . Chlorpheniramine Maleate (ALLERGY PO) Take by mouth as needed.    Marland Kitchen lisinopril-hydrochlorothiazide (PRINZIDE,ZESTORETIC) 20-25 MG tablet Take 1 tablet by mouth daily.    . Melatonin 10 MG TABS Take by mouth daily.    . Multiple Vitamins-Minerals (MULTIVITAMIN MEN PO) Take by mouth.    . pravastatin (PRAVACHOL) 40 MG tablet Take 40 mg by mouth daily.     Allergies Allergies  Allergen Reactions  . Penicillins Swelling    Swelling of lips   Family History Family History  Problem Relation Age of Onset  . Diabetes Mother   . Heart disease Mother   . Hypertension Mother   . Breast cancer Mother   . Arthritis Mother   . Cancer Mother        breast  . Hyperlipidemia Mother   . Heart disease Father   . Stroke Father   . Arthritis Father   . Cancer Father        prostate  . Hyperlipidemia Father   . Heart disease Paternal Grandmother    . Cancer Maternal Uncle        lung  . Cancer Paternal Aunt        lung  . Cancer Paternal Uncle        lung    Review of Systems: Constitutional:  no unexpected change in weight, no fevers or chills Eye:  no recent significant change in vision Ear/Nose/Mouth/Throat:  Ears:  no tinnitus or hearing loss Nose/Mouth/Throat:  no complaints of nasal congestion or bleeding, no sore throat and oral sores Cardiovascular:  no chest pain, no palpitations Respiratory:  no cough and no shortness of breath Gastrointestinal:  no abdominal pain, no change in bowel habits, no nausea, vomiting, diarrhea, or constipation and no black or bloody stool GU:  Male: negative for dysuria, frequency, and incontinence and negative for prostate symptoms Musculoskeletal/Extremities:  no pain, redness, or swelling of the joints Integumentary (Skin/Breast):  no abnormal skin lesions reported Neurologic:  no headaches, no numbness, tingling Endocrine: No weight changes, masses in the neck, heat/cold intolerance, bowel or skin changes, or cardiovascular system symptoms Hematologic/Lymphatic:  no abnormal bleeding, no HIV risk factors, no night sweats, no swollen nodes, no weight loss  Exam BP 120/68 (BP Location: Left Arm, Patient Position: Sitting, Cuff  Size: Normal)   Pulse 65   Temp 98.2 F (36.8 C) (Oral)   Ht 5\' 8"  (1.727 m)   Wt 220 lb (99.8 kg)   SpO2 98%   BMI 33.45 kg/m  General:  well developed, well nourished, in no apparent distress Skin:  1 cm x 0.8 cm hyperpigmented patch on R trap area, There is also a circular and freely moveable nodule on the R lateral wrist, no TTP, fluctuance, or erythema; otherwise no significant moles, warts, or growths Head:  no masses, lesions, or tenderness Eyes:  pupils equal and round, sclera anicteric without injection Ears:  canals without lesions, TMs shiny without retraction, no obvious effusion, no erythema Nose:  nares patent, septum midline, mucosa  normal Throat/Pharynx:  lips and gingiva without lesion; tongue and uvula midline; non-inflamed pharynx; no exudates or postnasal drainage Neck: neck supple without adenopathy, thyromegaly, or masses Lungs:  clear to auscultation, breath sounds equal bilaterally, no respiratory distress Cardio:  regular rate and rhythm, no bruits, no LE edema Abdomen:  abdomen soft, nontender; bowel sounds normal; no masses or organomegaly Genital (male): circumcised penis, no lesions or discharge; testes present bilaterally without masses or tenderness Rectal: Deferred Musculoskeletal:  symmetrical muscle groups noted without atrophy or deformity Extremities:  no clubbing, cyanosis, or edema, no deformities, no skin discoloration Neuro:  gait normal; deep tendon reflexes normal and symmetric Psych: well oriented with normal range of affect and appropriate judgment/insight  Assessment and Plan  Well adult exam - Plan: Comprehensive metabolic panel, Lipid panel, CBC  Skin lesion  Elevated PSA - Plan: PSA  Screening for HIV (human immunodeficiency virus) - Plan: HIV antibody  Need for hepatitis C screening test - Plan: Hepatitis C antibody   Well 61 y.o. male. Will keep an eye on hyperpigmented patch. Pt states it has been there for years and it is not changing, he just wants someone to keep an eye on it.  Counseled on diet and exercise. Doing well. Healthy diet handout given. Immunizations, labs, and further orders as above. If PSA is >4 again, will refer to urology as I have not received his records. Will also calculate 10 yr CVD and let him know if he needs to be on a statin. Follow up in 6 mo pending above. The patient voiced understanding and agreement to the plan.  [place], DO 03/28/17 8:19 AM

## 2017-03-28 NOTE — Patient Instructions (Addendum)
Healthy Eating Plan Many factors influence your heart health, including eating and exercise habits. Heart (coronary) risk increases with abnormal blood fat (lipid) levels. Heart-healthy meal planning includes limiting unhealthy fats, increasing healthy fats, and making other small dietary changes. This includes maintaining a healthy body weight to help keep lipid levels within a normal range.  WHAT IS MY PLAN?  Your health care provider recommends that you:  Drink a glass of water before meals to help with satiety.  Eat slowly.  An alternative to the water is to add Metamucil. This will help with satiety as well. It does contain calories, unlike water.  WHAT TYPES OF FAT SHOULD I CHOOSE?  Choose healthy fats more often. Choose monounsaturated and polyunsaturated fats, such as olive oil and canola oil, flaxseeds, walnuts, almonds, and seeds.  Eat more omega-3 fats. Good choices include salmon, mackerel, sardines, tuna, flaxseed oil, and ground flaxseeds. Aim to eat fish at least two times each week.  Avoid foods with partially hydrogenated oils in them. These contain trans fats. Examples of foods that contain trans fats are stick margarine, some tub margarines, cookies, crackers, and other baked goods. If you are going to avoid a fat, this is the one to avoid!  WHAT GENERAL GUIDELINES DO I NEED TO FOLLOW?  Check food labels carefully to identify foods with trans fats. Avoid these types of options when possible.  Fill one half of your plate with vegetables and green salads. Eat 4-5 servings of vegetables per day. A serving of vegetables equals 1 cup of raw leafy vegetables,  cup of raw or cooked cut-up vegetables, or  cup of vegetable juice.  Fill one fourth of your plate with whole grains. Look for the word "whole" as the first word in the ingredient list.  Fill one fourth of your plate with lean protein foods.  Eat 4-5 servings of fruit per day. A serving of fruit equals one medium  whole fruit,  cup of dried fruit,  cup of fresh, frozen, or canned fruit. Try to avoid fruits in cups/syrups as the sugar content can be high.  Eat more foods that contain soluble fiber. Examples of foods that contain this type of fiber are apples, broccoli, carrots, beans, peas, and barley. Aim to get 20-30 g of fiber per day.  Eat more home-cooked food and less restaurant, buffet, and fast food.  Limit or avoid alcohol.  Limit foods that are high in starch and sugar.  Avoid fried foods when able.  Cook foods by using methods other than frying. Baking, boiling, grilling, and broiling are all great options. Other fat-reducing suggestions include: ? Removing the skin from poultry. ? Removing all visible fats from meats. ? Skimming the fat off of stews, soups, and gravies before serving them. ? Steaming vegetables in water or broth.  Lose weight if you are overweight. Losing just 5-10% of your initial body weight can help your overall health and prevent diseases such as diabetes and heart disease.  Increase your consumption of nuts, legumes, and seeds to 4-5 servings per week. One serving of dried beans or legumes equals  cup after being cooked, one serving of nuts equals 1 ounces, and one serving of seeds equals  ounce or 1 tablespoon.  WHAT ARE GOOD FOODS CAN I EAT? Grains Grainy breads (try to find bread that is 3 g of fiber per slice or greater), oatmeal, light popcorn. Whole-grain cereals. Rice and pasta, including brown rice and those that are made with whole wheat.   Edamame pasta is a great alternative to grain pasta. It has a higher protein content. Try to avoid significant consumption of white bread, sugary cereals, or pastries/baked goods.  Vegetables All vegetables. Cooked white potatoes do not count as vegetables.  Fruits All fruits, but limit pineapple and bananas as these fruits have a higher sugar content.  Meats and Other Protein Sources Lean, well-trimmed beef,  veal, pork, and lamb. Chicken and [place] without skin. All fish and shellfish. Wild duck, rabbit, pheasant, and venison. Egg whites or low-cholesterol egg substitutes. Dried beans, peas, lentils, and tofu.Seeds and most nuts.  Dairy Low-fat or nonfat cheeses, including ricotta, string, and mozzarella. Skim or 1% milk that is liquid, powdered, or evaporated. Buttermilk that is made with low-fat milk. Nonfat or low-fat yogurt. Soy/Almond milk are good alternatives if you cannot handle dairy.  Beverages Water is the best for you. Sports drinks with less sugar are more desirable unless you are a highly active athlete.  Sweets and Desserts Sherbets and fruit ices. Honey, jam, marmalade, jelly, and syrups. Dark chocolate.  Eat all sweets and desserts in moderation.  Fats and Oils Nonhydrogenated (trans-free) margarines. Vegetable oils, including soybean, sesame, sunflower, olive, peanut, safflower, corn, canola, and cottonseed. Salad dressings or mayonnaise that are made with a vegetable oil. Limit added fats and oils that you use for cooking, baking, salads, and as spreads.  Other Cocoa powder. Coffee and tea. Most condiments.  The items listed above may not be a complete list of recommended foods or beverages. Contact your dietitian for more options.  If your PSA is elevated, we will be referring you to a Urologist for further evaluation.  Give [place] 2-3 business days to get the results of your labs back.

## 2017-03-29 LAB — HEPATITIS C ANTIBODY: HCV Ab: NEGATIVE

## 2017-03-29 LAB — HIV ANTIBODY (ROUTINE TESTING W REFLEX): HIV 1&2 Ab, 4th Generation: NONREACTIVE

## 2017-03-31 ENCOUNTER — Encounter: Payer: Self-pay | Admitting: Family Medicine

## 2017-04-10 ENCOUNTER — Encounter: Payer: Self-pay | Admitting: Medical

## 2017-04-10 ENCOUNTER — Ambulatory Visit: Payer: Self-pay | Admitting: Medical

## 2017-04-10 VITALS — BP 116/82 | HR 70 | Temp 98.5°F | Resp 16

## 2017-04-10 DIAGNOSIS — L03011 Cellulitis of right finger: Secondary | ICD-10-CM

## 2017-04-10 DIAGNOSIS — L089 Local infection of the skin and subcutaneous tissue, unspecified: Secondary | ICD-10-CM

## 2017-04-10 MED ORDER — SULFAMETHOXAZOLE-TRIMETHOPRIM 800-160 MG PO TABS: 1.0000 | ORAL_TABLET | Freq: Two times a day (BID) | ORAL | 0 refills | Status: DC

## 2017-04-10 NOTE — Progress Notes (Signed)
Patient complains of index finger on right hand with red, hot, swollen area around the nail.  Pocket of infection, but he is unsure of the cause.  Has been bothering him for 3 days.  Subjective:    Patient ID: Sean Bowers, male    DOB: [birth year], 61 y.o.   MRN: [id number]  HPI    Review of Systems Continued on another note page    Objective:   Physical Exam  Continued on another note page      Assessment & Plan:  Continued on another note page.

## 2017-04-10 NOTE — Patient Instructions (Addendum)
Clean twice daily with dilute soap and water, neosporin to the site and a bandage.  If pocket of yellow appears over weekend seek out medical care at an urgent care clinic .  Return to the clinic if not improving in 3-5 days.  A Tdap was given today.

## 2017-04-10 NOTE — Progress Notes (Signed)
   Subjective:    Patient ID: Sean Bowers, male    DOB: [birth year], 61 y.o.   MRN: [id number]  HPI   61 yo male comes in for finger infections right index for 3 days. 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 Unsure of last Tetanus vaccination, has a new grandchild  40 month old. Note Probation officer in epic disconnected, had to start a new note, could not delete the old note.   Review of Systems  Constitutional: Negative.   HENT: Negative.   Eyes: Negative.   Respiratory: Negative.   Cardiovascular: Negative.   Gastrointestinal: Negative.   Endocrine: Negative.   Genitourinary: Negative.   Musculoskeletal: Negative.   Allergic/Immunologic: Negative.   Neurological: Negative.   Hematological: Negative.   Psychiatric/Behavioral: Negative.        Objective:   Physical Exam  Constitutional: He is oriented to person, place, and time. He appears well-developed and well-nourished.  HENT:  Head: Normocephalic and atraumatic.  Eyes: EOM are normal. Pupils are equal, round, and reactive to light.  Neck: Normal range of motion.  Musculoskeletal: Normal range of motion.  Neurological: He is alert and oriented to person, place, and time.  Skin: Skin is warm and dry. There is erythema.  Psychiatric: He has a normal mood and affect. His behavior is normal.  Nursing note and vitals reviewed.   Paronychia right index finger on the medial side of the nail surronding area with erythema.  Tdap updated today.    Assessment & Plan:  Skin infection, Paronychia. E-prescribed Bactrim DS one by mouth twice daily x 7 days.  Betadine cleaned and #11 blade used to cut open, little amount of pus came out. Triple antibiotic ointment to the site and a bandage applied.Reviewed wound care with patient. Reviewed with patient when to seek medical care over the weekend if a pus pocket occurs or worsening infection. Given  Ibuprofen 800 mg by mouth in clinic. Lot [id number] ep 5/20 Return to the clinic as needed.

## 2017-08-18 ENCOUNTER — Encounter: Payer: Self-pay | Admitting: Family Medicine

## 2017-08-19 MED ORDER — LISINOPRIL-HYDROCHLOROTHIAZIDE 20-25 MG PO TABS: 1.0000 | ORAL_TABLET | Freq: Every day | ORAL | 0 refills | Status: DC

## 2017-09-24 ENCOUNTER — Encounter: Payer: Self-pay | Admitting: Family Medicine

## 2017-09-24 ENCOUNTER — Ambulatory Visit: Payer: BLUE CROSS/BLUE SHIELD | Admitting: Family Medicine

## 2017-09-24 VITALS — BP 120/80 | HR 75 | Temp 98.3°F | Ht 68.0 in | Wt 215.0 lb

## 2017-09-24 DIAGNOSIS — M7711 Lateral epicondylitis, right elbow: Secondary | ICD-10-CM

## 2017-09-24 DIAGNOSIS — I10 Essential (primary) hypertension: Secondary | ICD-10-CM

## 2017-09-24 DIAGNOSIS — E7800 Pure hypercholesterolemia, unspecified: Secondary | ICD-10-CM

## 2017-09-24 MED ORDER — METHYLPREDNISOLONE ACETATE 40 MG/ML IJ SUSP
20.0000 mg | Freq: Once | INTRAMUSCULAR | Status: AC
  Administered 2017-09-24: 20 mg

## 2017-09-24 NOTE — Progress Notes (Signed)
Pre visit review using our clinic review tool, if applicable. No additional management support is needed unless otherwise documented below in the visit note. 

## 2017-09-24 NOTE — Patient Instructions (Addendum)
Ice/cold pack over area for 10-15 min every 2-3 hours while awake.  The Band-It is a strap that goes on the forearm that can be helpful with recovery for tennis elbow.   Ibuprofen 400-600 mg (2-3 over the counter strength tabs) every 6 hours as needed for pain.  OK to take Tylenol 1000 mg (2 extra strength tabs) or 975 mg (3 regular strength tabs) every 6 hours as needed.  Keep up the great work with your weight loss.  Let us know if you need anything.

## 2017-09-24 NOTE — Progress Notes (Signed)
Musculoskeletal Exam  Patient: Sean Bowers DOB: 1956/05/14  DOS: 09/24/2017  SUBJECTIVE:  Chief Complaint:   Chief Complaint  Patient presents with  . Follow-up    right elbow pain    Foxx Klarich is a 61 y.o.  male for evaluation and treatment of R elbow pain.   Onset:  3 weeks ago, got worse over 1 week ago. Location: out elbow Character:  sharp  Progression of issue:  has worsened slightly Associated symptoms: none Treatment: to date has been forearm strap.   Neurovascular symptoms: no  Hypertension Patient presents for hypertension follow up. He does monitor home blood pressures. Blood pressures ranging on average from 120's/70's. He is compliant with medications. Prinzide 20-25 mg daily Patient has these side effects of medication: none He is adhering to a healthy diet overall. Exercise: walking He has lost 5 lbs  Hyperlipidemia Patient presents for hyperlipidemia follow up. Compliance with treatment thus far has been good on pravastatin 40 mg daily. He denies myalgias. He is adhering to a low sodium and low fat diet. The patient is not known to have coexisting coronary artery disease.  ROS: Musculoskeletal/Extremities: +elbow pain Neurologic: no numbness, tingling no weakness   Past Medical History:  Diagnosis Date  . Allergy   . Arthritis   . Asthma    no inhalers  . Diverticulitis 09/2016   seen in Urgent Care  . Genital warts   . History of chicken pox   . History of hay fever   . Hyperlipidemia 09/27/2016  . Hypertension    Past Surgical History:  Procedure Laterality Date  . CARPAL TUNNEL RELEASE Bilateral 2006-2007   Family History  Problem Relation Age of Onset  . Diabetes Mother   . Heart disease Mother   . Hypertension Mother   . Breast cancer Mother   . Arthritis Mother   . Cancer Mother        breast  . Hyperlipidemia Mother   . Heart disease Father   . Stroke Father   . Arthritis Father   . Cancer Father    prostate  . Hyperlipidemia Father   . Heart disease Paternal Grandmother   . Cancer Maternal Uncle        lung  . Cancer Paternal Aunt        lung  . Cancer Paternal Uncle        lung   Current Outpatient Medications  Medication Sig Dispense Refill  . aspirin 81 MG tablet Take 81 mg by mouth daily.    . Chlorpheniramine Maleate (ALLERGY PO) Take by mouth as needed.    Marland Kitchen lisinopril-hydrochlorothiazide (PRINZIDE,ZESTORETIC) 20-25 MG tablet Take 1 tablet by mouth daily. 90 tablet 0  . Multiple Vitamins-Minerals (MULTIVITAMIN MEN PO) Take by mouth.    . sulfamethoxazole-trimethoprim (BACTRIM DS,SEPTRA DS) 800-160 MG tablet Take 1 tablet by mouth 2 (two) times daily. 14 tablet 0  . pravastatin (PRAVACHOL) 40 MG tablet Take 40 mg by mouth daily.     Allergies  Allergen Reactions  . Penicillins Swelling    Swelling of lips   Social History   Socioeconomic History  . Marital status: Married  Tobacco Use  . Smoking status: Never Smoker  . Smokeless tobacco: Never Used  Substance and Sexual Activity  . Alcohol use: No  . Drug use: No   Objective: VITAL SIGNS: BP 120/80 (BP Location: Left Arm, Patient Position: Sitting, Cuff Size: Normal)   Pulse 75   Temp 98.3 F (36.8 C) (Oral)  Ht 5\' 8"  (1.727 m)   Wt 215 lb (97.5 kg)   SpO2 96%   BMI 32.69 kg/m  Constitutional: Well formed, well developed. No acute distress. Cardiovascular: Brisk cap refill Thorax & Lungs: No accessory muscle use Extremities: No clubbing. No cyanosis. No edema.  Skin: Warm. Dry. No erythema. No rash.  Musculoskeletal: R elbow.   Normal active range of motion: yes.   Normal passive range of motion: yes Tenderness to palpation: yes, over lat epicondyle Deformity: no Ecchymosis: no +TTP w resisted wrist extension Neurologic: Normal sensory function.  Psychiatric: Normal mood. Age appropriate judgment and insight. Alert & oriented x 3.    Procedure Note; Lat epicondyle injection Verbal consent  obtained. R lat epicondyle palpated and marked.  Cleaned w alcohol and freeze spray used.  A 27-gauge needle was used to enter and inject 20 mg of Depomedrol with 1 mL of 1% lidocaine w.o epi was injected. A bandaid was placed.  The patient tolerated the procedure well. There were no complications noted.   Assessment:  Lateral epicondylitis of right elbow - Plan: PR INJECT TENDON SHEATH/LIGAMENT  Essential hypertension  Pure hypercholesterolemia - Plan: Lipid panel  Plan: Orders as above. Band-IT brace. Ice NSAIDs. Tylenol.  Counseled on diet and exercise. F/u in 6 mo for CPE. Prn for elbow issue. The patient voiced understanding and agreement to the plan.   [place], DO 09/24/17  3:06 PM

## 2017-09-24 NOTE — Addendum Note (Signed)
Addended by: Sharon Seller B on: 09/24/2017 03:22 PM   Modules accepted: Orders

## 2017-09-29 ENCOUNTER — Other Ambulatory Visit: Payer: Self-pay

## 2017-09-29 DIAGNOSIS — E7800 Pure hypercholesterolemia, unspecified: Secondary | ICD-10-CM

## 2017-09-30 LAB — LIPID PANEL
CHOLESTEROL TOTAL: 206 mg/dL — AB (ref 100–199)
Chol/HDL Ratio: 4.5 ratio (ref 0.0–5.0)
HDL: 46 mg/dL (ref >39)
LDL CALC: 131 mg/dL — AB (ref 0–99)
Triglycerides: 143 mg/dL (ref 0–149)
VLDL Cholesterol Cal: 29 mg/dL (ref 5–40)

## 2017-10-01 ENCOUNTER — Ambulatory Visit: Payer: BLUE CROSS/BLUE SHIELD | Admitting: Family Medicine

## 2017-10-08 ENCOUNTER — Encounter: Payer: Self-pay | Admitting: Family Medicine

## 2017-10-08 ENCOUNTER — Ambulatory Visit: Payer: BLUE CROSS/BLUE SHIELD | Admitting: Family Medicine

## 2017-10-08 VITALS — BP 122/86 | HR 69 | Temp 98.2°F | Ht 68.0 in | Wt 214.1 lb

## 2017-10-08 DIAGNOSIS — M545 Low back pain, unspecified: Secondary | ICD-10-CM

## 2017-10-08 MED ORDER — CYCLOBENZAPRINE HCL 10 MG PO TABS: 10.0000 mg | ORAL_TABLET | Freq: Three times a day (TID) | ORAL | 0 refills | Status: DC | PRN

## 2017-10-08 MED ORDER — NAPROXEN 500 MG PO TABS: 500.0000 mg | ORAL_TABLET | Freq: Two times a day (BID) | ORAL | 0 refills | Status: DC

## 2017-10-08 NOTE — Progress Notes (Signed)
Pre visit review using our clinic review tool, if applicable. No additional management support is needed unless otherwise documented below in the visit note. 

## 2017-10-08 NOTE — Patient Instructions (Addendum)
OK to take Tylenol 1000 mg (2 extra strength tabs) or 975 mg (3 regular strength tabs) every 6 hours as needed.  Heat (pad or rice pillow in microwave) over affected area, 10-15 minutes every 2-3 hours while awake.   Take Flexeril (cyclobenzaprine) 1-2 hours before planned bedtime. If it makes you drowsy, do not take during the day. You can try half a tab the following night.  EXERCISES  RANGE OF MOTION (ROM) AND STRETCHING EXERCISES - Low Back Pain Most people with lower back pain will find that their symptoms get worse with excessive bending forward (flexion) or arching at the lower back (extension). The exercises that will help resolve your symptoms will focus on the opposite motion.  Your physician, physical therapist or athletic trainer will help you determine which exercises will be most helpful to resolve your lower back pain. Do not complete any exercises without first consulting with your caregiver. Discontinue any exercises which make your symptoms worse, until you speak to your caregiver. If you have pain, numbness or tingling which travels down into your buttocks, leg or foot, the goal of the therapy is for these symptoms to move closer to your back and eventually resolve. Sometimes, these leg symptoms will get better, but your lower back pain may worsen. This is often an indication of progress in your rehabilitation. Be very alert to any changes in your symptoms and the activities in which you participated in the 24 hours prior to the change. Sharing this information with your caregiver will allow him or her to most efficiently treat your condition. These exercises may help you when beginning to rehabilitate your injury. Your symptoms may resolve with or without further involvement from your physician, physical therapist or athletic trainer. While completing these exercises, remember:   Restoring tissue flexibility helps normal motion to return to the joints. This allows healthier, less  painful movement and activity.  An effective stretch should be held for at least 30 seconds.  A stretch should never be painful. You should only feel a gentle lengthening or release in the stretched tissue. FLEXION RANGE OF MOTION AND STRETCHING EXERCISES:  STRETCH - Flexion, Single Knee to Chest   Lie on a firm bed or floor with both legs extended in front of you.  Keeping one leg in contact with the floor, bring your opposite knee to your chest. Hold your leg in place by either grabbing behind your thigh or at your knee.  Pull until you feel a gentle stretch in your low back. Hold 15-20 seconds.  Slowly release your grasp and repeat the exercise with the opposite side. Repeat 2 times. Complete this exercise 1-2 times per day.   STRETCH - Flexion, Double Knee to Chest  Lie on a firm bed or floor with both legs extended in front of you.  Keeping one leg in contact with the floor, bring your opposite knee to your chest.  Tense your stomach muscles to support your back and then lift your other knee to your chest. Hold your legs in place by either grabbing behind your thighs or at your knees.  Pull both knees toward your chest until you feel a gentle stretch in your low back. Hold 15-20 seconds.  Tense your stomach muscles and slowly return one leg at a time to the floor. Repeat 2 times. Complete this exercise 1-2 times per day.   STRETCH - Low Trunk Rotation  Lie on a firm bed or floor. Keeping your legs in front of  you, bend your knees so they are both pointed toward the ceiling and your feet are flat on the floor.  Extend your arms out to the side. This will stabilize your upper body by keeping your shoulders in contact with the floor.  Gently and slowly drop both knees together to one side until you feel a gentle stretch in your low back. Hold for 15-20 seconds.  Tense your stomach muscles to support your lower back as you bring your knees back to the starting position. Repeat  the exercise to the other side. Repeat 2 times. Complete this exercise 1-2 times per day  EXTENSION RANGE OF MOTION AND FLEXIBILITY EXERCISES:  STRETCH - Extension, Prone on Elbows   Lie on your stomach on the floor, a bed will be too soft. Place your palms about shoulder width apart and at the height of your head.  Place your elbows under your shoulders. If this is too painful, stack pillows under your chest.  Allow your body to relax so that your hips drop lower and make contact more completely with the floor.  Hold this position for 15-20 seconds.  Slowly return to lying flat on the floor. Repeat 2 times. Complete this exercise 1-2 times per day.   RANGE OF MOTION - Extension, Prone Press Ups  Lie on your stomach on the floor, a bed will be too soft. Place your palms about shoulder width apart and at the height of your head.  Keeping your back as relaxed as possible, slowly straighten your elbows while keeping your hips on the floor. You may adjust the placement of your hands to maximize your comfort. As you gain motion, your hands will come more underneath your shoulders.  Hold this position 15-20 seconds.  Slowly return to lying flat on the floor. Repeat 2 times. Complete this exercise 1-2 times per day.   RANGE OF MOTION- Quadruped, Neutral Spine   Assume a hands and knees position on a firm surface. Keep your hands under your shoulders and your knees under your hips. You may place padding under your knees for comfort.  Drop your head and point your tailbone toward the ground below you. This will round out your lower back like an angry cat. Hold this position for 15-20 seconds.  Slowly lift your head and release your tail bone so that your back sags into a large arch, like an old horse.  Hold this position for 15-20 seconds.  Repeat this until you feel limber in your low back.  Now, find your "sweet spot." This will be the most comfortable position somewhere between the  two previous positions. This is your neutral spine. Once you have found this position, tense your stomach muscles to support your low back.  Hold this position for 15-20 seconds. Repeat 2 times. Complete this exercise 1-2 times per day.  STRENGTHENING EXERCISES - Low Back Sprain These exercises may help you when beginning to rehabilitate your injury. These exercises should be done near your "sweet spot." This is the neutral, low-back arch, somewhere between fully rounded and fully arched, that is your least painful position. When performed in this safe range of motion, these exercises can be used for people who have either a flexion or extension based injury. These exercises may resolve your symptoms with or without further involvement from your physician, physical therapist or athletic trainer. While completing these exercises, remember:   Muscles can gain both the endurance and the strength needed for everyday activities through controlled exercises.  Complete these exercises as instructed by your physician, physical therapist or athletic trainer. Increase the resistance and repetitions only as guided.  You may experience muscle soreness or fatigue, but the pain or discomfort you are trying to eliminate should never worsen during these exercises. If this pain does worsen, stop and make certain you are following the directions exactly. If the pain is still present after adjustments, discontinue the exercise until you can discuss the trouble with your caregiver.  STRENGTHENING - Deep Abdominals, Pelvic Tilt   Lie on a firm bed or floor. Keeping your legs in front of you, bend your knees so they are both pointed toward the ceiling and your feet are flat on the floor.  Tense your lower abdominal muscles to press your low back into the floor. This motion will rotate your pelvis so that your tail bone is scooping upwards rather than pointing at your feet or into the floor. With a gentle tension and  even breathing, hold this position for 10-15 seconds. Repeat 2 times. Complete this exercise 1 time per day.   STRENGTHENING - Abdominals, Crunches   Lie on a firm bed or floor. Keeping your legs in front of you, bend your knees so they are both pointed toward the ceiling and your feet are flat on the floor. Cross your arms over your chest.  Slightly tip your chin down without bending your neck.  Tense your abdominals and slowly lift your trunk high enough to just clear your shoulder blades. Lifting higher can put excessive stress on the lower back and does not further strengthen your abdominal muscles.  Control your return to the starting position. Repeat 2 times. Complete this exercise once every 1-2 days.   STRENGTHENING - Quadruped, Opposite UE/LE Lift   Assume a hands and knees position on a firm surface. Keep your hands under your shoulders and your knees under your hips. You may place padding under your knees for comfort.  Find your neutral spine and gently tense your abdominal muscles so that you can maintain this position. Your shoulders and hips should form a rectangle that is parallel with the floor and is not twisted.  Keeping your trunk steady, lift your right hand no higher than your shoulder and then your left leg no higher than your hip. Make sure you are not holding your breath. Hold this position for 15-20 seconds.  Continuing to keep your abdominal muscles tense and your back steady, slowly return to your starting position. Repeat with the opposite arm and leg. Repeat 2 times. Complete this exercise once every 1-2 days.   STRENGTHENING - Abdominals and Quadriceps, Straight Leg Raise   Lie on a firm bed or floor with both legs extended in front of you.  Keeping one leg in contact with the floor, bend the other knee so that your foot can rest flat on the floor.  Find your neutral spine, and tense your abdominal muscles to maintain your spinal position throughout the  exercise.  Slowly lift your straight leg off the floor about 6 inches for a count of 15, making sure to not hold your breath.  Still keeping your neutral spine, slowly lower your leg all the way to the floor. Repeat this exercise with each leg 2 times. Complete this exercise once every 1-2 days. POSTURE AND BODY MECHANICS CONSIDERATIONS - Low Back Sprain Keeping correct posture when sitting, standing or completing your activities will reduce the stress put on different body tissues, allowing injured tissues a  chance to heal and limiting painful experiences. The following are general guidelines for improved posture. Your physician or physical therapist will provide you with any instructions specific to your needs. While reading these guidelines, remember:  The exercises prescribed by your provider will help you have the flexibility and strength to maintain correct postures.  The correct posture provides the best environment for your joints to work. All of your joints have less wear and tear when properly supported by a spine with good posture. This means you will experience a healthier, less painful body.  Correct posture must be practiced with all of your activities, especially prolonged sitting and standing. Correct posture is as important when doing repetitive low-stress activities (typing) as it is when doing a single heavy-load activity (lifting).  RESTING POSITIONS Consider which positions are most painful for you when choosing a resting position. If you have pain with flexion-based activities (sitting, bending, stooping, squatting), choose a position that allows you to rest in a less flexed posture. You would want to avoid curling into a fetal position on your side. If your pain worsens with extension-based activities (prolonged standing, working overhead), avoid resting in an extended position such as sleeping on your stomach. Most people will find more comfort when they rest with their spine  in a more neutral position, neither too rounded nor too arched. Lying on a non-sagging bed on your side with a pillow between your knees, or on your back with a pillow under your knees will often provide some relief. Keep in mind, being in any one position for a prolonged period of time, no matter how correct your posture, can still lead to stiffness. PROPER SITTING POSTURE In order to minimize stress and discomfort on your spine, you must sit with correct posture. Sitting with good posture should be effortless for a healthy body. Returning to good posture is a gradual process. Many people can work toward this most comfortably by using various supports until they have the flexibility and strength to maintain this posture on their own. When sitting with proper posture, your ears will fall over your shoulders and your shoulders will fall over your hips. You should use the back of the chair to support your upper back. Your lower back will be in a neutral position, just slightly arched. You may place a small pillow or folded towel at the base of your lower back for  support.  When working at a desk, create an environment that supports good, upright posture. Without extra support, muscles tire, which leads to excessive strain on joints and other tissues. Keep these recommendations in mind:  CHAIR:  A chair should be able to slide under your desk when your back makes contact with the back of the chair. This allows you to work closely.  The chair's height should allow your eyes to be level with the upper part of your monitor and your hands to be slightly lower than your elbows.  BODY POSITION  Your feet should make contact with the floor. If this is not possible, use a foot rest.  Keep your ears over your shoulders. This will reduce stress on your neck and low back.  INCORRECT SITTING POSTURES  If you are feeling tired and unable to assume a healthy sitting posture, do not slouch or slump. This puts  excessive strain on your back tissues, causing more damage and pain. Healthier options include:  Using more support, like a lumbar pillow.  Switching tasks to something that requires you  to be upright or walking.  Talking a brief walk.  Lying down to rest in a neutral-spine position.  PROLONGED STANDING WHILE SLIGHTLY LEANING FORWARD  When completing a task that requires you to lean forward while standing in one place for a long time, place either foot up on a stationary 2-4 inch high object to help maintain the best posture. When both feet are on the ground, the lower back tends to lose its slight inward curve. If this curve flattens (or becomes too large), then the back and your other joints will experience too much stress, tire more quickly, and can cause pain.  CORRECT STANDING POSTURES Proper standing posture should be assumed with all daily activities, even if they only take a few moments, like when brushing your teeth. As in sitting, your ears should fall over your shoulders and your shoulders should fall over your hips. You should keep a slight tension in your abdominal muscles to brace your spine. Your tailbone should point down to the ground, not behind your body, resulting in an over-extended swayback posture.   INCORRECT STANDING POSTURES  Common incorrect standing postures include a forward head, locked knees and/or an excessive swayback. WALKING Walk with an upright posture. Your ears, shoulders and hips should all line-up.  PROLONGED ACTIVITY IN A FLEXED POSITION When completing a task that requires you to bend forward at your waist or lean over a low surface, try to find a way to stabilize 3 out of 4 of your limbs. You can place a hand or elbow on your thigh or rest a knee on the surface you are reaching across. This will provide you more stability, so that your muscles do not tire as quickly. By keeping your knees relaxed, or slightly bent, you will also reduce stress across  your lower back. CORRECT LIFTING TECHNIQUES  DO :  Assume a wide stance. This will provide you more stability and the opportunity to get as close as possible to the object which you are lifting.  Tense your abdominals to brace your spine. Bend at the knees and hips. Keeping your back locked in a neutral-spine position, lift using your leg muscles. Lift with your legs, keeping your back straight.  Test the weight of unknown objects before attempting to lift them.  Try to keep your elbows locked down at your sides in order get the best strength from your shoulders when carrying an object.     Always ask for help when lifting heavy or awkward objects. INCORRECT LIFTING TECHNIQUES DO NOT:   Lock your knees when lifting, even if it is a small object.  Bend and twist. Pivot at your feet or move your feet when needing to change directions.  Assume that you can safely pick up even a paperclip without proper posture.

## 2017-10-08 NOTE — Progress Notes (Signed)
Musculoskeletal Exam  Patient: Sean Bowers DOB: 1956/01/06  DOS: 10/08/2017  SUBJECTIVE:  Chief Complaint:   Chief Complaint  Patient presents with  . Back Pain    for 10 days    Sean Bowers is a 61 y.o.  male for evaluation and treatment of his back pain.   Onset:  10 days ago.  Sudden, no injury or change in activity.  Location: R lower Character:  dull, 3/10 Progression of issue:  is unchanged Associated symptoms: None Denies bowel/bladder incontinence or weakness Treatment: to date has been Ibuprofen and some sort of muscle relaxant.   Neurovascular symptoms: no  ROS: Gastrointestinal: no bowel incontinence Genitourinary: No bladder incontinence Musculoskeletal/Extremities: +back pain Neurologic: no numbness, tingling no weakness   Past Medical History:  Diagnosis Date  . Allergy   . Arthritis   . Asthma    no inhalers  . Diverticulitis 09/2016   seen in Urgent Care  . Genital warts   . History of chicken pox   . History of hay fever   . Hyperlipidemia 09/27/2016  . Hypertension    Past Surgical History:  Procedure Laterality Date  . CARPAL TUNNEL RELEASE Bilateral 2006-2007    Current Outpatient Medications  Medication Sig Dispense Refill  . aspirin 81 MG tablet Take 81 mg by mouth daily.    . Chlorpheniramine Maleate (ALLERGY PO) Take by mouth as needed.    Marland Kitchen glucosamine-chondroitin 500-400 MG tablet Take 1 tablet by mouth 2 (two) times daily.    Marland Kitchen lisinopril-hydrochlorothiazide (PRINZIDE,ZESTORETIC) 20-25 MG tablet Take 1 tablet by mouth daily. 90 tablet 0  . Multiple Vitamins-Minerals (MULTIVITAMIN MEN PO) Take by mouth.    . pravastatin (PRAVACHOL) 40 MG tablet Take 40 mg by mouth daily.     Allergies  Allergen Reactions  . Penicillins Swelling    Swelling of lips   Social History   Socioeconomic History  . Marital status: Married    Objective:  VITAL SIGNS: BP 122/86 (BP Location: Left Arm, Patient Position: Sitting, Cuff  Size: Normal)   Pulse 69   Temp 98.2 F (36.8 C) (Oral)   Ht 5\' 8"  (1.727 m)   Wt 214 lb 2 oz (97.1 kg)   SpO2 99%   BMI 32.56 kg/m  Constitutional: Well formed, well developed. No acute distress. Extremities: No clubbing. No cyanosis. No edema.  Skin: Warm. Dry. No erythema. No rash.  Musculoskeletal: low back.   Decreased ROM of hamstrings b/l Tenderness to palpation: yes- over R  Deformity: no Ecchymosis: no Straight leg test: negative for  5/5 strength throughout in LE's Neurologic: Normal sensory function. No focal deficits noted. DTR's equal and symmetry in LE's. No clonus. Psychiatric: Normal mood. Age appropriate judgment and insight. Alert & oriented x 3.    Assessment:  Acute right-sided low back pain without sciatica  Plan: NSAID and muscle relaxant, tylenol, heat, stretches/exercises. F/u prn. The patient voiced understanding and agreement to the plan.   [place], DO 10/08/17  7:44 AM

## 2017-11-03 ENCOUNTER — Emergency Department (HOSPITAL_BASED_OUTPATIENT_CLINIC_OR_DEPARTMENT_OTHER): Payer: BLUE CROSS/BLUE SHIELD

## 2017-11-03 ENCOUNTER — Encounter: Payer: Self-pay | Admitting: Medical

## 2017-11-03 ENCOUNTER — Encounter (HOSPITAL_BASED_OUTPATIENT_CLINIC_OR_DEPARTMENT_OTHER): Payer: Self-pay | Admitting: Radiology

## 2017-11-03 ENCOUNTER — Inpatient Hospital Stay (HOSPITAL_BASED_OUTPATIENT_CLINIC_OR_DEPARTMENT_OTHER)
Admission: EM | Admit: 2017-11-03 | Discharge: 2017-11-05 | DRG: 313 | Disposition: A | Discharge status: Home / Self Care | Payer: BLUE CROSS/BLUE SHIELD | Attending: Family Medicine | Admitting: Family Medicine

## 2017-11-03 ENCOUNTER — Encounter (HOSPITAL_BASED_OUTPATIENT_CLINIC_OR_DEPARTMENT_OTHER): Payer: Self-pay | Admitting: Emergency Medicine

## 2017-11-03 ENCOUNTER — Ambulatory Visit: Payer: Self-pay

## 2017-11-03 ENCOUNTER — Other Ambulatory Visit: Payer: Self-pay

## 2017-11-03 ENCOUNTER — Ambulatory Visit: Payer: BLUE CROSS/BLUE SHIELD | Admitting: Medical

## 2017-11-03 VITALS — BP 131/81 | HR 77 | Temp 97.9°F | Resp 16 | Ht 68.0 in | Wt 216.8 lb

## 2017-11-03 DIAGNOSIS — E876 Hypokalemia: Secondary | ICD-10-CM | POA: Diagnosis present

## 2017-11-03 DIAGNOSIS — I10 Essential (primary) hypertension: Secondary | ICD-10-CM | POA: Diagnosis present

## 2017-11-03 DIAGNOSIS — I25110 Atherosclerotic heart disease of native coronary artery with unstable angina pectoris: Secondary | ICD-10-CM | POA: Diagnosis not present

## 2017-11-03 DIAGNOSIS — R0600 Dyspnea, unspecified: Secondary | ICD-10-CM

## 2017-11-03 DIAGNOSIS — R079 Chest pain, unspecified: Secondary | ICD-10-CM | POA: Diagnosis not present

## 2017-11-03 DIAGNOSIS — Z7982 Long term (current) use of aspirin: Secondary | ICD-10-CM

## 2017-11-03 DIAGNOSIS — Z791 Long term (current) use of non-steroidal anti-inflammatories (NSAID): Secondary | ICD-10-CM | POA: Diagnosis not present

## 2017-11-03 DIAGNOSIS — E785 Hyperlipidemia, unspecified: Secondary | ICD-10-CM | POA: Diagnosis not present

## 2017-11-03 DIAGNOSIS — K769 Liver disease, unspecified: Secondary | ICD-10-CM | POA: Diagnosis present

## 2017-11-03 DIAGNOSIS — R0789 Other chest pain: Secondary | ICD-10-CM

## 2017-11-03 DIAGNOSIS — Z8249 Family history of ischemic heart disease and other diseases of the circulatory system: Secondary | ICD-10-CM

## 2017-11-03 DIAGNOSIS — I361 Nonrheumatic tricuspid (valve) insufficiency: Secondary | ICD-10-CM | POA: Diagnosis not present

## 2017-11-03 DIAGNOSIS — I200 Unstable angina: Secondary | ICD-10-CM | POA: Diagnosis not present

## 2017-11-03 DIAGNOSIS — Z880 Allergy status to penicillin: Secondary | ICD-10-CM

## 2017-11-03 DIAGNOSIS — R072 Precordial pain: Secondary | ICD-10-CM | POA: Diagnosis not present

## 2017-11-03 DIAGNOSIS — Z79899 Other long term (current) drug therapy: Secondary | ICD-10-CM

## 2017-11-03 DIAGNOSIS — R7989 Other specified abnormal findings of blood chemistry: Secondary | ICD-10-CM | POA: Diagnosis not present

## 2017-11-03 LAB — COMPREHENSIVE METABOLIC PANEL
ALK PHOS: 38 U/L (ref 38–126)
ALT: 29 U/L (ref 17–63)
ANION GAP: 8 (ref 5–15)
AST: 26 U/L (ref 15–41)
Albumin: 4.3 g/dL (ref 3.5–5.0)
BUN: 13 mg/dL (ref 6–20)
CALCIUM: 9.5 mg/dL (ref 8.9–10.3)
CO2: 27 mmol/L (ref 22–32)
Chloride: 103 mmol/L (ref 101–111)
Creatinine, Ser: 0.83 mg/dL (ref 0.61–1.24)
GFR calc non Af Amer: >60 mL/min (ref >60)
Glucose, Bld: 82 mg/dL (ref 65–99)
POTASSIUM: 3.3 mmol/L — AB (ref 3.5–5.1)
SODIUM: 138 mmol/L (ref 135–145)
TOTAL PROTEIN: 7.4 g/dL (ref 6.5–8.1)
Total Bilirubin: 1.2 mg/dL (ref 0.3–1.2)

## 2017-11-03 LAB — CBC WITH DIFFERENTIAL/PLATELET
BASOS PCT: 0 %
Basophils Absolute: 0 10*3/uL (ref 0.0–0.1)
EOS ABS: 0.1 10*3/uL (ref 0.0–0.7)
EOS PCT: 1 %
HCT: 43.4 % (ref 39.0–52.0)
HEMOGLOBIN: 15.7 g/dL (ref 13.0–17.0)
LYMPHS ABS: 3.3 10*3/uL (ref 0.7–4.0)
Lymphocytes Relative: 37 %
MCH: 30.1 pg (ref 26.0–34.0)
MCHC: 36.2 g/dL — AB (ref 30.0–36.0)
MCV: 83.3 fL (ref 78.0–100.0)
MONO ABS: 0.8 10*3/uL (ref 0.1–1.0)
MONOS PCT: 9 %
NEUTROS PCT: 53 %
Neutro Abs: 4.8 10*3/uL (ref 1.7–7.7)
Platelets: 180 10*3/uL (ref 150–400)
RBC: 5.21 MIL/uL (ref 4.22–5.81)
RDW: 12.6 % (ref 11.5–15.5)
WBC: 9.1 10*3/uL (ref 4.0–10.5)

## 2017-11-03 LAB — BRAIN NATRIURETIC PEPTIDE: B NATRIURETIC PEPTIDE 5: 8.2 pg/mL (ref 0.0–100.0)

## 2017-11-03 LAB — D-DIMER, QUANTITATIVE (NOT AT ARMC): D DIMER QUANT: 0.83 ug{FEU}/mL — AB (ref 0.00–0.50)

## 2017-11-03 LAB — TROPONIN I: Troponin I: <0.03 ng/mL (ref <0.03)

## 2017-11-03 MED ORDER — IOPAMIDOL (ISOVUE-370) INJECTION 76%
100.0000 mL | Freq: Once | INTRAVENOUS | Status: AC | PRN
  Administered 2017-11-03: 83 mL via INTRAVENOUS

## 2017-11-03 MED ORDER — ASPIRIN 81 MG PO CHEW
243.0000 mg | CHEWABLE_TABLET | Freq: Once | ORAL | Status: DC
Start: 2017-11-03 — End: 2017-11-03
  Filled 2017-11-03: qty 3

## 2017-11-03 MED ORDER — MORPHINE SULFATE (PF) 4 MG/ML IV SOLN
4.0000 mg | Freq: Once | INTRAVENOUS | Status: AC
  Administered 2017-11-03: 4 mg via INTRAVENOUS
  Filled 2017-11-03: qty 1

## 2017-11-03 MED ORDER — ASPIRIN 81 MG PO CHEW
324.0000 mg | CHEWABLE_TABLET | Freq: Once | ORAL | Status: AC
  Administered 2017-11-03: 324 mg via ORAL

## 2017-11-03 MED ORDER — ASPIRIN 325 MG PO TABS: 325.0000 mg | ORAL_TABLET | Freq: Once | ORAL | Status: DC

## 2017-11-03 MED ORDER — NITROGLYCERIN 0.4 MG SL SUBL
0.4000 mg | SUBLINGUAL_TABLET | SUBLINGUAL | Status: DC | PRN
  Administered 2017-11-03 (×2): 0.4 mg via SUBLINGUAL
  Filled 2017-11-03: qty 1

## 2017-11-03 NOTE — Telephone Encounter (Signed)
   Reason for Disposition . [1] MODERATE longstanding difficulty breathing (e.g., speaks in phrases, SOB even at rest, pulse 100-120) AND [2] SAME as normal  Answer Assessment - Initial Assessment Questions 1. RESPIRATORY STATUS: "Describe your breathing?" (e.g., wheezing, shortness of breath, unable to speak, severe coughing)      Shortness of breath 2. ONSET: "When did this breathing problem begin?"      1 week ago 3. PATTERN "Does the difficult breathing come and go, or has it been constant since it started?"      Constant 4. SEVERITY: "How bad is your breathing?" (e.g., mild, moderate, severe)    - MILD: No SOB at rest, mild SOB with walking, speaks normally in sentences, can lay down, no retractions, pulse < 100.    - MODERATE: SOB at rest, SOB with minimal exertion and prefers to sit, cannot lie down flat, speaks in phrases, mild retractions, audible wheezing, pulse 100-120.    - SEVERE: Very SOB at rest, speaks in single words, struggling to breathe, sitting hunched forward, retractions, pulse > 120      Mild 5. RECURRENT SYMPTOM: "Have you had difficulty breathing before?" If so, ask: "When was the last time?" and "What happened that time?"      No 6. CARDIAC HISTORY: "Do you have any history of heart disease?" (e.g., heart attack, angina, bypass surgery, angioplasty)      No 7. LUNG HISTORY: "Do you have any history of lung disease?"  (e.g., pulmonary embolus, asthma, emphysema)     No 8. CAUSE: "What do you think is causing the breathing problem?"      Unsure 9. OTHER SYMPTOMS: "Do you have any other symptoms? (e.g., dizziness, runny nose, cough, chest pain, fever)     No 10. PREGNANCY: "Is there any chance you are pregnant?" "When was your last menstrual period?"       NoNo 11. TRAVEL: "Have you traveled out of the country in the last month?" (e.g., travel history, exposures)       No  Protocols used: BREATHING DIFFICULTY-A-AH  Pt. He sleeps well at night, does not bother  him, can lay flat in bed. He noticed this after shoveling snow last week. Continues to work and take the stairs. Appointment made for today.

## 2017-11-03 NOTE — ED Notes (Signed)
Pt states had 1 ntg pain was a 3  Came down to 0-1  And now has gone back to 2 and maybe increasing  Talked to his primary rn and she stated to give 2nd ntg

## 2017-11-03 NOTE — Patient Instructions (Addendum)
With your 1 week dyspnea,  2 days of chest pressure, history of hypertension, hyperlipidemia, and family history of MI I do think it is best to be evaluated in the emergency department.  EKG in office showed normal sinus rhythm.    Under the above scenario, you would need to have workup that would likely include chest x-ray, serial troponins studies, d-dimer and possible BNP.  The extent of workup would be determined by the emergency department.  We are unable to do the appropriate workup as outpatient.  I have called downstairs and talked with his charge nurse( since both MD in rooms) and let her know you are coming down.  Follow-up date to be determined after ED evaluation.

## 2017-11-03 NOTE — Progress Notes (Signed)
Subjective:    Patient ID: Sean Bowers, male    DOB: 01-Dec-1955, 61 y.o.   MRN: [id number]  HPI  Pt in for some shortness of breath for about for about one week(he mentioned first noticed shoveling snow). Pt not hearing any wheezing. He states short of breath all the time even at rest.   Pt states yesterday and today some heaviness to chest yesterday that lasted for a couple of hours then went away . No pain in his shoulder. No jaw pain.   Then heaviness in chest came back this afternoon about 2 hours ago.   Pt does have some high cholesterol. Pt mom and dad both had MI. Mom had MI early 98's. She had open heart surgery. Dad had MI in his 51s.   No hx of smoking.  Remote hx of asthma as child.  Some family stress.    Review of Systems  Constitutional: Positive for fatigue. Negative for chills and fever.  Respiratory: Positive for shortness of breath. Negative for cough, chest tightness and wheezing.   Cardiovascular: Positive for chest pain.       Heaviness sensation.  Gastrointestinal: Negative for abdominal distention, abdominal pain, anal bleeding, blood in stool, diarrhea, nausea and vomiting.  Musculoskeletal: Negative for back pain, gait problem, myalgias and neck stiffness.  Skin: Negative for rash.  Neurological: Negative for dizziness, syncope, speech difficulty, weakness and headaches.  Hematological: Negative for adenopathy. Does not bruise/bleed easily.  Psychiatric/Behavioral: Negative for behavioral problems, confusion, sleep disturbance and suicidal ideas. The patient is nervous/anxious.        Some recent family stress regarding her daughter with some medical problems.   Past Medical History:  Diagnosis Date  . Allergy   . Arthritis   . Asthma    no inhalers  . Diverticulitis 09/2016   seen in Urgent Care  . Genital warts   . History of chicken pox   . History of hay fever   . Hyperlipidemia 09/27/2016  . Hypertension      Social History    Socioeconomic History  . Marital status: Married    Spouse name: Not on file  . Number of children: Not on file  . Years of education: Not on file  . Highest education level: Not on file  Social Needs  . Financial resource strain: Not on file  . Food insecurity - worry: Not on file  . Food insecurity - inability: Not on file  . Transportation needs - medical: Not on file  . Transportation needs - non-medical: Not on file  Occupational History  . Not on file  Tobacco Use  . Smoking status: Never Smoker  . Smokeless tobacco: Never Used  Substance and Sexual Activity  . Alcohol use: No  . Drug use: No  . Sexual activity: Not on file  Other Topics Concern  . Not on file  Social History Narrative  . Not on file    Past Surgical History:  Procedure Laterality Date  . CARPAL TUNNEL RELEASE Bilateral 2006-2007    Family History  Problem Relation Age of Onset  . Diabetes Mother   . Heart disease Mother   . Hypertension Mother   . Breast cancer Mother   . Arthritis Mother   . Cancer Mother        breast  . Hyperlipidemia Mother   . Heart disease Father   . Stroke Father   . Arthritis Father   . Cancer Father  prostate  . Hyperlipidemia Father   . Heart disease Paternal Grandmother   . Cancer Maternal Uncle        lung  . Cancer Paternal Aunt        lung  . Cancer Paternal Uncle        lung    Allergies  Allergen Reactions  . Penicillins Swelling    Swelling of lips    Current Outpatient Medications on File Prior to Visit  Medication Sig Dispense Refill  . aspirin 81 MG tablet Take 81 mg by mouth daily.    . Chlorpheniramine Maleate (ALLERGY PO) Take by mouth as needed.    . cyclobenzaprine (FLEXERIL) 10 MG tablet Take 1 tablet (10 mg total) by mouth 3 (three) times daily as needed for muscle spasms. 20 tablet 0  . glucosamine-chondroitin 500-400 MG tablet Take 1 tablet by mouth 2 (two) times daily.    Marland Kitchen lisinopril-hydrochlorothiazide  (PRINZIDE,ZESTORETIC) 20-25 MG tablet Take 1 tablet by mouth daily. 90 tablet 0  . Multiple Vitamins-Minerals (MULTIVITAMIN MEN PO) Take by mouth.    . naproxen (NAPROSYN) 500 MG tablet Take 1 tablet (500 mg total) by mouth 2 (two) times daily with a meal. 30 tablet 0   Current Facility-Administered Medications on File Prior to Visit  Medication Dose Route Frequency Provider Last Rate Last Dose  . 0.9 %  sodium chloride infusion  500 mL Intravenous Continuous Armbruster, Carlota Raspberry, MD        BP 131/81   Pulse 77   Temp 97.9 F (36.6 C) (Oral)   Resp 16   Ht 5\' 8"  (1.727 m)   Wt 216 lb 12.8 oz (98.3 kg)   SpO2 97%   BMI 32.96 kg/m       Objective:   Physical Exam  General Mental Status- Alert. General Appearance- Not in acute distress.  Patient reports feeling short of breath even sitting.  Skin General: Color- Normal Color. Moisture- Normal Moisture.  Neck Carotid Arteries- Normal color. Moisture- Normal Moisture. No carotid bruits. No JVD.  Chest and Lung Exam Auscultation: Breath Sounds:-Normal and clear.  Cardiovascular Auscultation:Rythm- Regular. Murmurs & Other Heart Sounds:Auscultation of the heart reveals- No Murmurs.  Abdomen Inspection:-Inspeection Normal. Palpation/Percussion:Note:No mass. Palpation and Percussion of the abdomen reveal- Non Tender, Non Distended + BS, no rebound or guarding.   Neurologic Cranial Nerve exam:- CN III-XII intact(No nystagmus), symmetric smile. Strength:- 5/5 equal and symmetric strength both upper and lower extremities.  Lower extremity-1+ pedal edema obvious on the right side.  Left side edema not as obvious.  Both sides/lower extremities show negative Homans sign.     Assessment & Plan:  With your 1 week dyspnea,  2 days of chest pressure, history of hypertension, hyperlipidemia, and family history of MI I do think it is best to be evaluated in the emergency department.  EKG showed normal sinus rhythm.  Under the  above scenario, you would need to have workup that would likely include chest x-ray, serial troponins studies, d-dimer and possible BNP.  The extent of workup would be determined by the emergency department.  We are unable to do the appropriate workup as outpatient.  I have called downstairs and talked with his charge nurse( since both MD in rooms) and let her know you are coming down.  Follow-up date to be determined after ED evaluation.

## 2017-11-03 NOTE — ED Triage Notes (Signed)
Patient sent from PCP upstairs due to shortness of breath x 1 week, chest pain x 2 days.  Denies nausea, vomiting.

## 2017-11-03 NOTE — ED Provider Notes (Addendum)
[facility] EMERGENCY DEPARTMENT Provider Note   CSN: [id number] Arrival date & time: 11/03/17  1714     History   Chief Complaint Chief Complaint  Patient presents with  . Shortness of Breath  . Chest Pain    HPI Sean Bowers is a 61 y.o. male history of hypertension, hyperlipidemia, family history of MI who presented with chest pain, shortness of breath.  Patient states that he was shoveling snow about a week ago and since then has constant shortness of breath.  Shortness of breath is not worse with exertion or deep breathing.  For the last 2 days, patient has some substernal chest pain as well that is worse with exertion.  Denies any nausea or vomiting. Has chronic leg swelling that is not worsening. Both his parents had MI age 63s.   The history is provided by the patient.    Past Medical History:  Diagnosis Date  . Allergy   . Arthritis   . Asthma    no inhalers  . Diverticulitis 09/2016   seen in Urgent Care  . Genital warts   . History of chicken pox   . History of hay fever   . Hyperlipidemia 09/27/2016  . Hypertension     Patient Active Problem List   Diagnosis Date Noted  . Essential hypertension 09/27/2016  . Hyperlipidemia 09/27/2016    Past Surgical History:  Procedure Laterality Date  . CARPAL TUNNEL RELEASE Bilateral 2006-2007       Home Medications    Prior to Admission medications   Medication Sig Start Date End Date Taking? Authorizing Provider  aspirin 81 MG tablet Take 81 mg by mouth daily.    [provider]  Chlorpheniramine Maleate (ALLERGY PO) Take by mouth as needed.    [provider]  cyclobenzaprine (FLEXERIL) 10 MG tablet Take 1 tablet (10 mg total) by mouth 3 (three) times daily as needed for muscle spasms. 10/08/17   Shelda Pal, DO  glucosamine-chondroitin 500-400 MG tablet Take 1 tablet by mouth 2 (two) times daily.    [provider]  lisinopril-hydrochlorothiazide  (PRINZIDE,ZESTORETIC) 20-25 MG tablet Take 1 tablet by mouth daily. 08/19/17   Shelda Pal, DO  Multiple Vitamins-Minerals (MULTIVITAMIN MEN PO) Take by mouth.    [provider]  naproxen (NAPROSYN) 500 MG tablet Take 1 tablet (500 mg total) by mouth 2 (two) times daily with a meal. 10/08/17   Wendling, Crosby Oyster, DO    Family History Family History  Problem Relation Age of Onset  . Diabetes Mother   . Heart disease Mother   . Hypertension Mother   . Breast cancer Mother   . Arthritis Mother   . Cancer Mother        breast  . Hyperlipidemia Mother   . Heart disease Father   . Stroke Father   . Arthritis Father   . Cancer Father        prostate  . Hyperlipidemia Father   . Heart disease Paternal Grandmother   . Cancer Maternal Uncle        lung  . Cancer Paternal Aunt        lung  . Cancer Paternal Uncle        lung    Social History Social History   Tobacco Use  . Smoking status: Never Smoker  . Smokeless tobacco: Never Used  Substance Use Topics  . Alcohol use: No  . Drug use: No     Allergies  Penicillins   Review of Systems Review of Systems  Respiratory: Positive for shortness of breath.   Cardiovascular: Positive for chest pain.  All other systems reviewed and are negative.    Physical Exam Updated Vital Signs BP 121/81 (BP Location: Right Arm)   Pulse 64   Temp 97.7 F (36.5 C) (Oral)   Resp 18   Ht 5\' 8"  (1.727 m)   Wt 97.5 kg (215 lb)   SpO2 96%   BMI 32.69 kg/m   Physical Exam  Constitutional:  Slightly uncomfortable   HENT:  Head: Normocephalic.  Mouth/Throat: Oropharynx is clear and moist.  Eyes: Pupils are equal, round, and reactive to light.  Neck: Normal range of motion.  Cardiovascular: Normal rate.  Pulmonary/Chest: Effort normal and breath sounds normal.  Abdominal: Soft. Bowel sounds are normal.  Musculoskeletal: Normal range of motion.  1+ edema bilaterally. No calf tenderness   Neurological:  He is alert.  Skin: Capillary refill takes less than 2 seconds.  Psychiatric: He has a normal mood and affect.  Nursing note and vitals reviewed.    ED Treatments / Results  Labs (all labs ordered are listed, but only abnormal results are displayed) Labs Reviewed  CBC WITH DIFFERENTIAL/PLATELET - Abnormal; Notable for the following components:      Result Value   MCHC 36.2 (*)    All other components within normal limits  COMPREHENSIVE METABOLIC PANEL - Abnormal; Notable for the following components:   Potassium 3.3 (*)    All other components within normal limits  D-DIMER, QUANTITATIVE (NOT AT [facility]) - Abnormal; Notable for the following components:   D-Dimer, Quant 0.83 (*)    All other components within normal limits  TROPONIN I  BRAIN NATRIURETIC PEPTIDE    EKG  EKG Interpretation  Date/Time:  Monday November 03 2017 17:21:43 EST Ventricular Rate:  71 PR Interval:  170 QRS Duration: 100 QT Interval:  388 QTC Calculation: 421 R Axis:   43 Text Interpretation:  Normal sinus rhythm Normal ECG No previous ECGs available Confirmed by Wandra Arthurs [phone]) on 11/03/2017 6:17:09 PM       Radiology Dg Chest 2 View  Result Date: 11/03/2017 CLINICAL DATA:  Chest pain EXAM: CHEST  2 VIEW COMPARISON:  None. FINDINGS: The heart size and mediastinal contours are within normal limits. Both lungs are clear. The visualized skeletal structures are unremarkable. IMPRESSION: No active cardiopulmonary disease. Electronically Signed   By: Franchot Gallo M.D.   On: 11/03/2017 18:53   Ct Angio Chest Pe W And/or Wo Contrast  Result Date: 11/03/2017 CLINICAL DATA:  Shortness of breath for the past 5 days. Elevated D-dimer. Evaluate for pulmonary embolism. EXAM: CT ANGIOGRAPHY CHEST WITH CONTRAST TECHNIQUE: Multidetector CT imaging of the chest was performed using the standard protocol during bolus administration of intravenous contrast. Multiplanar CT image reconstructions and MIPs were  obtained to evaluate the vascular anatomy. CONTRAST:  8mL ISOVUE-370 IOPAMIDOL (ISOVUE-370) INJECTION 76% COMPARISON:  Chest x-ray from same day. FINDINGS: Cardiovascular: Satisfactory opacification of the pulmonary arteries to the segmental level. No evidence of pulmonary embolism. Normal heart size. No pericardial effusion. Normal caliber thoracic aorta. Mediastinum/Nodes: No enlarged mediastinal, hilar, or axillary lymph nodes. Thyroid gland, trachea, and esophagus demonstrate no significant findings. Lungs/Pleura: Lungs are clear. No pleural effusion or pneumothorax. Upper Abdomen: No acute abnormality. Dystrophic calcifications within the right liver are nonspecific. Musculoskeletal: Bilateral gynecomastia. No acute or significant osseous findings. Moderate degenerative changes of the lower thoracic spine. Review of the  MIP images confirms the above findings. IMPRESSION: 1. No evidence of pulmonary embolism. No acute intrathoracic process. 2. Dystrophic calcifications in the right liver, nonspecific, possibly related to prior granulomatous disease. Consider ultrasound for further evaluation to exclude underlying lesion. Electronically Signed   By: Titus Dubin M.D.   On: 11/03/2017 20:58    Procedures Procedures (including critical care time)  Medications Ordered in ED Medications  nitroGLYCERIN (NITROSTAT) SL tablet 0.4 mg (0.4 mg Sublingual Given 11/03/17 2039)  morphine 4 MG/ML injection 4 mg (not administered)  aspirin chewable tablet 324 mg (324 mg Oral Given 11/03/17 1830)  iopamidol (ISOVUE-370) 76 % injection 100 mL (83 mLs Intravenous Contrast Given 11/03/17 2021)     Initial Impression / Assessment and Plan / ED Course  I have reviewed the triage vital signs and the nursing notes.  Pertinent labs & imaging results that were available during my care of the patient were reviewed by me and considered in my medical decision making (see chart for details).     Sean Bowers is a  61 y.o. male here with chest pain, SOB. Consider ACS vs unstable angina, less likely PE. He has no personal hx of CAD but has family hx of CAD. Will get labs, trop, d-dimer.   10:04 PM D-dimer positive. CTA showed no PE. Still had 1/10 pain and pressure after 2 nitro, ASA. Concerned for unstable angina. Trop neg x 1. I called Dr. Therisa Doyne from cardiology, who reviewed case and states that patient doesn't need emergent cath tonight and can be admitted by hospitalist and he will consult. Will admit to [facility]. I talked to Dr. Shanon Brow, who request that patient be pain free and then she will put in bed request.   11:05 PM Pain free now. Dr. Shanon Brow to admit.   Final Clinical Impressions(s) / ED Diagnoses   Final diagnoses:  Unstable angina [facility])    ED Discharge Orders    None       Drenda Freeze, MD 11/03/17 2204    Drenda Freeze, MD 11/03/17 [phone]

## 2017-11-04 ENCOUNTER — Observation Stay (HOSPITAL_COMMUNITY): Payer: BLUE CROSS/BLUE SHIELD

## 2017-11-04 ENCOUNTER — Encounter (HOSPITAL_COMMUNITY): Payer: Self-pay | Admitting: Family Medicine

## 2017-11-04 DIAGNOSIS — I25110 Atherosclerotic heart disease of native coronary artery with unstable angina pectoris: Secondary | ICD-10-CM | POA: Diagnosis not present

## 2017-11-04 DIAGNOSIS — R072 Precordial pain: Secondary | ICD-10-CM | POA: Diagnosis not present

## 2017-11-04 DIAGNOSIS — E876 Hypokalemia: Secondary | ICD-10-CM | POA: Diagnosis present

## 2017-11-04 DIAGNOSIS — I10 Essential (primary) hypertension: Secondary | ICD-10-CM | POA: Diagnosis not present

## 2017-11-04 DIAGNOSIS — K769 Liver disease, unspecified: Secondary | ICD-10-CM | POA: Diagnosis present

## 2017-11-04 DIAGNOSIS — R079 Chest pain, unspecified: Principal | ICD-10-CM

## 2017-11-04 LAB — TROPONIN I

## 2017-11-04 LAB — MAGNESIUM: MAGNESIUM: 1.7 mg/dL (ref 1.7–2.4)

## 2017-11-04 MED ORDER — LISINOPRIL 20 MG PO TABS
20.0000 mg | ORAL_TABLET | Freq: Every day | ORAL | Status: DC
  Administered 2017-11-04 – 2017-11-05 (×2): 20 mg via ORAL
  Filled 2017-11-04 (×2): qty 1

## 2017-11-04 MED ORDER — NITROGLYCERIN 0.4 MG SL SUBL: 0.4000 mg | SUBLINGUAL_TABLET | SUBLINGUAL | Status: DC | PRN

## 2017-11-04 MED ORDER — POTASSIUM CHLORIDE CRYS ER 20 MEQ PO TBCR
40.0000 meq | EXTENDED_RELEASE_TABLET | Freq: Once | ORAL | Status: AC
  Administered 2017-11-04: 40 meq via ORAL
  Filled 2017-11-04: qty 2

## 2017-11-04 MED ORDER — ALPRAZOLAM 0.25 MG PO TABS: 0.2500 mg | ORAL_TABLET | Freq: Two times a day (BID) | ORAL | Status: DC | PRN

## 2017-11-04 MED ORDER — NITROGLYCERIN 0.4 MG SL SUBL
SUBLINGUAL_TABLET | SUBLINGUAL | Status: AC
  Filled 2017-11-04: qty 1

## 2017-11-04 MED ORDER — GLUCOSAMINE-CHONDROITIN 500-400 MG PO TABS: 1.0000 | ORAL_TABLET | Freq: Two times a day (BID) | ORAL | Status: DC

## 2017-11-04 MED ORDER — ASPIRIN 81 MG PO CHEW
81.0000 mg | CHEWABLE_TABLET | Freq: Every day | ORAL | Status: DC
  Administered 2017-11-04 – 2017-11-05 (×2): 81 mg via ORAL
  Filled 2017-11-04 (×2): qty 1

## 2017-11-04 MED ORDER — MORPHINE SULFATE (PF) 2 MG/ML IV SOLN: 1.0000 mg | INTRAVENOUS | Status: DC | PRN

## 2017-11-04 MED ORDER — ONDANSETRON HCL 4 MG/2ML IJ SOLN: 4.0000 mg | Freq: Four times a day (QID) | INTRAMUSCULAR | Status: DC | PRN

## 2017-11-04 MED ORDER — CYCLOBENZAPRINE HCL 10 MG PO TABS: 10.0000 mg | ORAL_TABLET | Freq: Three times a day (TID) | ORAL | Status: DC | PRN

## 2017-11-04 MED ORDER — HYDROCHLOROTHIAZIDE 25 MG PO TABS
25.0000 mg | ORAL_TABLET | Freq: Every day | ORAL | Status: DC
  Administered 2017-11-04 – 2017-11-05 (×2): 25 mg via ORAL
  Filled 2017-11-04 (×2): qty 1

## 2017-11-04 MED ORDER — IOPAMIDOL (ISOVUE-370) INJECTION 76%
INTRAVENOUS | Status: AC
  Administered 2017-11-04: 80 mL
  Filled 2017-11-04: qty 100

## 2017-11-04 MED ORDER — SODIUM CHLORIDE 0.9 % IV SOLN
INTRAVENOUS | Status: AC
Start: 2017-11-04 — End: 2017-11-05
  Administered 2017-11-04: 15:00:00 via INTRAVENOUS

## 2017-11-04 MED ORDER — ENOXAPARIN SODIUM 40 MG/0.4ML ~~LOC~~ SOLN
40.0000 mg | SUBCUTANEOUS | Status: DC
  Administered 2017-11-04 – 2017-11-05 (×2): 40 mg via SUBCUTANEOUS
  Filled 2017-11-04 (×2): qty 0.4

## 2017-11-04 MED ORDER — LISINOPRIL-HYDROCHLOROTHIAZIDE 20-25 MG PO TABS: 1.0000 | ORAL_TABLET | Freq: Every day | ORAL | Status: DC

## 2017-11-04 MED ORDER — NITROGLYCERIN 0.4 MG SL SUBL
0.4000 mg | SUBLINGUAL_TABLET | Freq: Once | SUBLINGUAL | Status: AC
  Administered 2017-11-04: 0.4 mg via SUBLINGUAL

## 2017-11-04 MED ORDER — ACETAMINOPHEN 325 MG PO TABS: 650.0000 mg | ORAL_TABLET | ORAL | Status: DC | PRN

## 2017-11-04 NOTE — Progress Notes (Signed)
Patient arrived to [facility] bed 19 from [facility].Assisted patient to bed by nursing staff.Oriented patient to nursing unit and call bell system.Patient denies chest pain or shortness of breath at present time.No acute distress noted at present time.Will continue to monitor patient.

## 2017-11-04 NOTE — H&P (Signed)
History and Physical    Sean Bowers CWC:[id number] DOB: 1956/07/12 DOA: 11/03/2017  PCP: Shelda Pal, DO   Patient coming from: Home, by way of [facility]   Chief Complaint: Chest pain, SOB  HPI: Sean Bowers is a 61 y.o. male with medical history significant for hypertension, now presenting to the emergency department for evaluation of shortness of breath and chest discomfort.  Patient reports that he developed dyspnea while shoveling snow approximately 1 week ago and this has persisted unchanged for the past week.  There is no cough and no fever associated with this.  He then developed an episode of "heaviness" in the central chest, mild in intensity, localized, and lasted approximately 2 hours prior to resolving spontaneously yesterday.  He had another episode of the chest discomfort today, prompting his presentation to the emergency department.  ED Course: Upon arrival to the ED, patient is found to be afebrile, saturating well on room air, and stable.  EKG features normal sinus rhythm and chest x-ray is negative for acute cardiopulmonary disease.  Chemistry panel is notable for potassium 3.3.  CBC is unremarkable.  D-dimer is elevated to 0.83.  Troponin is undetectable x2 5 hours apart and BNP is normal.  CTA chest was obtained and negative for PE and negative for acute cardiopulmonary disease.  Incidental notation is made of calcifications in the right liver on CTA.  The patient was treated with 324 mg of aspirin, nitroglycerin, and morphine in the ED.  Pain resolved.  He will be admitted to the telemetry unit for ongoing evaluation and management of chest discomfort with risk factors for CAD, but with initial evaluation reassuring.  Review of Systems:  All other systems reviewed and apart from HPI, are negative.  Past Medical History:  Diagnosis Date  . Allergy   . Arthritis   . Asthma    no inhalers  . Diverticulitis 09/2016   seen in Urgent Care  . Genital warts   .  History of chicken pox   . History of hay fever   . Hyperlipidemia 09/27/2016  . Hypertension     Past Surgical History:  Procedure Laterality Date  . CARPAL TUNNEL RELEASE Bilateral 2006-2007     reports that  has never smoked. he has never used smokeless tobacco. He reports that he does not drink alcohol or use drugs.  Allergies  Allergen Reactions  . Penicillins Swelling    Swelling of lips    Family History  Problem Relation Age of Onset  . Diabetes Mother   . Heart disease Mother   . Hypertension Mother   . Breast cancer Mother   . Arthritis Mother   . Cancer Mother        breast  . Hyperlipidemia Mother   . Heart disease Father   . Stroke Father   . Arthritis Father   . Cancer Father        prostate  . Hyperlipidemia Father   . Heart disease Paternal Grandmother   . Cancer Maternal Uncle        lung  . Cancer Paternal Aunt        lung  . Cancer Paternal Uncle        lung     Prior to Admission medications   Medication Sig Start Date End Date Taking? Authorizing Provider  aspirin 81 MG tablet Take 81 mg by mouth daily.    [provider]  Chlorpheniramine Maleate (ALLERGY PO) Take by mouth as needed.  [provider]  cyclobenzaprine (FLEXERIL) 10 MG tablet Take 1 tablet (10 mg total) by mouth 3 (three) times daily as needed for muscle spasms. 10/08/17   Shelda Pal, DO  glucosamine-chondroitin 500-400 MG tablet Take 1 tablet by mouth 2 (two) times daily.    [provider]  lisinopril-hydrochlorothiazide (PRINZIDE,ZESTORETIC) 20-25 MG tablet Take 1 tablet by mouth daily. 08/19/17   Shelda Pal, DO  Multiple Vitamins-Minerals (MULTIVITAMIN MEN PO) Take by mouth.    [provider]  naproxen (NAPROSYN) 500 MG tablet Take 1 tablet (500 mg total) by mouth 2 (two) times daily with a meal. 10/08/17   Shelda Pal, DO    Physical Exam: Vitals:   11/03/17 2330 11/03/17 2345 11/04/17 0045  11/04/17 0210  BP: 96/66 99/63 109/78 104/69  Pulse: 60 61 61 97  Resp: 17 16 15 18   Temp:    97.7 F (36.5 C)  TempSrc:    Oral  SpO2: 98% 98% 97% 93%  Weight:    96.8 kg (213 lb 6.5 oz)  Height:    5\' 8"  (1.727 m)      Constitutional: NAD, calm, comfortable Eyes: PERTLA, lids and conjunctivae normal ENMT: Mucous membranes are moist. Posterior pharynx clear of any exudate or lesions.   Neck: normal, supple, no masses, no thyromegaly Respiratory: clear to auscultation bilaterally, no wheezing, no crackles. Normal respiratory effort.   Cardiovascular: S1 & S2 heard, regular rate and rhythm. No extremity edema. No significant JVD. Abdomen: No distension, no tenderness, no masses palpated. Bowel sounds normal.  Musculoskeletal: no clubbing / cyanosis. No joint deformity upper and lower extremities.  Skin: no significant rashes, lesions, ulcers. Warm, dry, well-perfused. Neurologic: CN 2-12 grossly intact. Sensation intact. Strength 5/5 in all 4 limbs.  Psychiatric: Alert and oriented x 3. Calm, cooperative.     Labs on Admission: I have personally reviewed following labs and imaging studies  CBC: Recent Labs  Lab 11/03/17 1824  WBC 9.1  NEUTROABS 4.8  HGB 15.7  HCT 43.4  MCV 83.3  PLT 993   Basic Metabolic Panel: Recent Labs  Lab 11/03/17 1824  NA 138  K 3.3*  CL 103  CO2 27  GLUCOSE 82  BUN 13  CREATININE 0.83  CALCIUM 9.5   GFR: Estimated Creatinine Clearance: 106.8 mL/min (by C-G formula based on SCr of 0.83 mg/dL). Liver Function Tests: Recent Labs  Lab 11/03/17 1824  AST 26  ALT 29  ALKPHOS 38  BILITOT 1.2  PROT 7.4  ALBUMIN 4.3   No results for input(s): LIPASE, AMYLASE in the last 168 hours. No results for input(s): AMMONIA in the last 168 hours. Coagulation Profile: No results for input(s): INR, PROTIME in the last 168 hours. Cardiac Enzymes: Recent Labs  Lab 11/03/17 1825 11/03/17 2317  TROPONINI <0.03 <0.03   BNP (last 3  results) No results for input(s): PROBNP in the last 8760 hours. HbA1C: No results for input(s): HGBA1C in the last 72 hours. CBG: No results for input(s): GLUCAP in the last 168 hours. Lipid Profile: No results for input(s): CHOL, HDL, LDLCALC, TRIG, CHOLHDL, LDLDIRECT in the last 72 hours. Thyroid Function Tests: No results for input(s): TSH, T4TOTAL, FREET4, T3FREE, THYROIDAB in the last 72 hours. Anemia Panel: No results for input(s): VITAMINB12, FOLATE, FERRITIN, TIBC, IRON, RETICCTPCT in the last 72 hours. Urine analysis: No results found for: COLORURINE, APPEARANCEUR, LABSPEC, PHURINE, GLUCOSEU, HGBUR, BILIRUBINUR, KETONESUR, PROTEINUR, UROBILINOGEN, NITRITE, LEUKOCYTESUR Sepsis Labs: @LABRCNTIP (procalcitonin:4,lacticidven:4) )No results found for this  or any previous visit (from the past 240 hour(s)).   Radiological Exams on Admission: Dg Chest 2 View  Result Date: 11/03/2017 CLINICAL DATA:  Chest pain EXAM: CHEST  2 VIEW COMPARISON:  None. FINDINGS: The heart size and mediastinal contours are within normal limits. Both lungs are clear. The visualized skeletal structures are unremarkable. IMPRESSION: No active cardiopulmonary disease. Electronically Signed   By: Franchot Gallo M.D.   On: 11/03/2017 18:53   Ct Angio Chest Pe W And/or Wo Contrast  Result Date: 11/03/2017 CLINICAL DATA:  Shortness of breath for the past 5 days. Elevated D-dimer. Evaluate for pulmonary embolism. EXAM: CT ANGIOGRAPHY CHEST WITH CONTRAST TECHNIQUE: Multidetector CT imaging of the chest was performed using the standard protocol during bolus administration of intravenous contrast. Multiplanar CT image reconstructions and MIPs were obtained to evaluate the vascular anatomy. CONTRAST:  26mL ISOVUE-370 IOPAMIDOL (ISOVUE-370) INJECTION 76% COMPARISON:  Chest x-ray from same day. FINDINGS: Cardiovascular: Satisfactory opacification of the pulmonary arteries to the segmental level. No evidence of pulmonary  embolism. Normal heart size. No pericardial effusion. Normal caliber thoracic aorta. Mediastinum/Nodes: No enlarged mediastinal, hilar, or axillary lymph nodes. Thyroid gland, trachea, and esophagus demonstrate no significant findings. Lungs/Pleura: Lungs are clear. No pleural effusion or pneumothorax. Upper Abdomen: No acute abnormality. Dystrophic calcifications within the right liver are nonspecific. Musculoskeletal: Bilateral gynecomastia. No acute or significant osseous findings. Moderate degenerative changes of the lower thoracic spine. Review of the MIP images confirms the above findings. IMPRESSION: 1. No evidence of pulmonary embolism. No acute intrathoracic process. 2. Dystrophic calcifications in the right liver, nonspecific, possibly related to prior granulomatous disease. Consider ultrasound for further evaluation to exclude underlying lesion. Electronically Signed   By: Titus Dubin M.D.   On: 11/03/2017 20:58    EKG: Independently reviewed. Normal sinus rhythm.   Assessment/Plan  1. Chest pain  - Pt has PMHx of HTN and FHx of CAD, presenting with 1 wk of SOB and 2 days of intermittent chest discomfort  - Initial workup is reassuring with unremarkable EKG and CXR and undetectable troponin x2 5 hrs apart  - D-dimer was elevated and PE excluded with CTA chest  - He was treated with ASA 324 in ED  - Continue cardiac monitoring, obtain another troponin measurement, continue ACE-i and ASA, repeat EKG    2. Hypertension  - BP at goal  - Continue lisinopril and HCTZ    3. Hypokalemia  - Serum potassium is 3.3 on admission  - Treated with 40 mEq oral potassium  - Repeat chemistries in am    4. Liver lesion  - On CTA chest, incidental notation is made of dystrophic calcifications involving the right liver  - Non-emergent ultrasound recommended    DVT prophylaxis: Lovenox Code Status: Full  Family Communication: Discussed with patient Disposition Plan: Observe on  telemetry Consults called: Cardiology Admission status: Observation    Vianne Bulls, MD [facility] Pager [phone]  If 7PM-7AM, please contact night-coverage [email or web address] Password [email or web address]  11/04/2017, 2:57 AM

## 2017-11-04 NOTE — Progress Notes (Signed)
Pt educated on plan of care for the day wife at the bedside.

## 2017-11-04 NOTE — Progress Notes (Signed)
[facility]   Patient admitted after midnight see H&P for full details   61 y/o M with Hx of HTN, asthma, arthritis and diverticulitis, presented to the ED c/o SOB and chest tightness. Admitted with chest pain r/o ACS. EKG and troponin reassuring. Cardiology consulted recommending CT coronary. Patient currently asymptomatic, will continue plan per cardiology.   Sean Oman, MD

## 2017-11-04 NOTE — Progress Notes (Signed)
Dr.Opyd notified of patient arrival to unit. MD to come assess patient.

## 2017-11-04 NOTE — Consult Note (Signed)
Cardiology Consultation:   Patient ID: Elex Mainwaring; [id number]; 1955/12/08   Admit date: 11/03/2017 Date of Consult: 11/04/2017  Primary Care Provider: Shelda Pal, DO Primary Cardiologist: Dr. Stanford Breed Primary Electrophysiologist:     Patient Profile:   Zavion Sleight is a 61 y.o. male with a hx of HTN, HLD, diverticulitis, asthma, arthritis, and allergies who is being seen today for the evaluation of chest pain at the request of Dr. Patrecia Pour.  History of Present Illness:   Mr. Simonich presented to [facility] with a 1 week history of dyspnea after shoveling snow last week. Since then, he continued to have shortness of breath at rest and with exertion, stating it seems hard for him to catch his breath and to take a deep breaths.  Yesterday he developed substernal chest heaviness that was new for him. He was at rest when this occurred. Given that they are leaving town for the holidays, he wanted to be evaluated.  In the ED, he received nitro LS x 1 with resolution of his pain. His pain returned and he received a second nitro and morphine, which resolved his pain. Today, he has not had a recurrence of this chest pain. In the ED, EKG with poor R wave progression but NSR. Troponins x 3 negative. D-Dimer was elevated, CTA negative for PE.  Family history includes heart disease in both parents, both parents had CABG (Mom at 16 yo, Dad in his 17s), no siblings.  He is fairly active and climbs stairs daily at work. He has not had exertional chest pain leading up to this event.   He moved to [facility] from [facility] about 2 years ago. He states he had a treadmill stress test many years ago (10-15 years ago) that was normal. He has never had heart catheterization.    Past Medical History:  Diagnosis Date  . Allergy   . Arthritis   . Asthma    no inhalers  . Diverticulitis 09/2016   seen in Urgent Care  . Genital warts   . History of chicken pox   . History of hay fever   . Hyperlipidemia  09/27/2016  . Hypertension     Past Surgical History:  Procedure Laterality Date  . CARPAL TUNNEL RELEASE Bilateral 2006-2007     Home Medications:  Prior to Admission medications   Medication Sig Start Date End Date Taking? Authorizing Provider  aspirin 81 MG tablet Take 81 mg by mouth daily.    [provider]  Chlorpheniramine Maleate (ALLERGY PO) Take by mouth as needed.    [provider]  cyclobenzaprine (FLEXERIL) 10 MG tablet Take 1 tablet (10 mg total) by mouth 3 (three) times daily as needed for muscle spasms. 10/08/17   Shelda Pal, DO  glucosamine-chondroitin 500-400 MG tablet Take 1 tablet by mouth 2 (two) times daily.    [provider]  lisinopril-hydrochlorothiazide (PRINZIDE,ZESTORETIC) 20-25 MG tablet Take 1 tablet by mouth daily. 08/19/17   Shelda Pal, DO  Multiple Vitamins-Minerals (MULTIVITAMIN MEN PO) Take by mouth.    [provider]  naproxen (NAPROSYN) 500 MG tablet Take 1 tablet (500 mg total) by mouth 2 (two) times daily with a meal. 10/08/17   Wendling, Crosby Oyster, DO    Inpatient Medications: Scheduled Meds: . aspirin  81 mg Oral Daily  . enoxaparin (LOVENOX) injection  40 mg Subcutaneous Q24H  . lisinopril  20 mg Oral Daily   And  . hydrochlorothiazide  25 mg Oral Daily  Continuous Infusions:  PRN Meds: acetaminophen, ALPRAZolam, cyclobenzaprine, morphine injection, nitroGLYCERIN, ondansetron (ZOFRAN) IV  Allergies:    Allergies  Allergen Reactions  . Penicillins Swelling    Swelling of lips    Social History:   Social History   Socioeconomic History  . Marital status: Married    Spouse name: Not on file  . Number of children: Not on file  . Years of education: Not on file  . Highest education level: Not on file  Social Needs  . Financial resource strain: Not on file  . Food insecurity - worry: Not on file  . Food insecurity - inability: Not on file  . Transportation  needs - medical: Not on file  . Transportation needs - non-medical: Not on file  Occupational History  . Not on file  Tobacco Use  . Smoking status: Never Smoker  . Smokeless tobacco: Never Used  Substance and Sexual Activity  . Alcohol use: No  . Drug use: No  . Sexual activity: Not on file  Other Topics Concern  . Not on file  Social History Narrative  . Not on file    Family History:    Family History  Problem Relation Age of Onset  . Diabetes Mother   . Heart disease Mother   . Hypertension Mother   . Breast cancer Mother   . Arthritis Mother   . Cancer Mother        breast  . Hyperlipidemia Mother   . Heart disease Father   . Stroke Father   . Arthritis Father   . Cancer Father        prostate  . Hyperlipidemia Father   . Heart disease Paternal Grandmother   . Cancer Maternal Uncle        lung  . Cancer Paternal Aunt        lung  . Cancer Paternal Uncle        lung     ROS:  Please see the history of present illness.  ROS  All other ROS reviewed and negative.     Physical Exam/Data:   Vitals:   11/03/17 2345 11/04/17 0045 11/04/17 0210 11/04/17 0405  BP: 99/63 109/78 104/69 (!) 99/59  Pulse: 61 61 97 62  Resp: 16 15 18 18   Temp:   97.7 F (36.5 C) 98 F (36.7 C)  TempSrc:   Oral Oral  SpO2: 98% 97% 93% 95%  Weight:   213 lb 6.5 oz (96.8 kg)   Height:   5\' 8"  (1.727 m)     Intake/Output Summary (Last 24 hours) at 11/04/2017 1049 Last data filed at 11/04/2017 0700 Gross per 24 hour  Intake -  Output 350 ml  Net -350 ml   Filed Weights   11/03/17 1719 11/04/17 0210  Weight: 215 lb (97.5 kg) 213 lb 6.5 oz (96.8 kg)   Body mass index is 32.45 kg/m.  General:  Well nourished, well developed, in no acute distress HEENT: normal Neck: no JVD Vascular: No carotid bruits Cardiac:  normal S1, S2; RRR; no murmur Lungs:  clear to auscultation bilaterally, no wheezing, rhonchi or rales  Abd: soft, nontender, no hepatomegaly  Ext: no  edema Musculoskeletal:  No deformities, BUE and BLE strength normal and equal Skin: warm and dry  Neuro:  CNs 2-12 intact, no focal abnormalities noted Psych:  Normal affect   EKG:  The EKG was personally reviewed and demonstrates:  NSR Telemetry:  Telemetry was personally reviewed and demonstrates:  sinus  Relevant CV Studies:  none  Laboratory Data:  Chemistry Recent Labs  Lab 11/03/17 1824  NA 138  K 3.3*  CL 103  CO2 27  GLUCOSE 82  BUN 13  CREATININE 0.83  CALCIUM 9.5  GFRNONAA >60  GFRAA >60  ANIONGAP 8    Recent Labs  Lab 11/03/17 1824  PROT 7.4  ALBUMIN 4.3  AST 26  ALT 29  ALKPHOS 38  BILITOT 1.2   Hematology Recent Labs  Lab 11/03/17 1824  WBC 9.1  RBC 5.21  HGB 15.7  HCT 43.4  MCV 83.3  MCH 30.1  MCHC 36.2*  RDW 12.6  PLT 180   Cardiac Enzymes Recent Labs  Lab 11/03/17 1825 11/03/17 2317 11/04/17 0310 11/04/17 0811  TROPONINI <0.03 <0.03 <0.03 <0.03   No results for input(s): TROPIPOC in the last 168 hours.  BNP Recent Labs  Lab 11/03/17 1825  BNP 8.2    DDimer  Recent Labs  Lab 11/03/17 1825  DDIMER 0.83*    Radiology/Studies:  Dg Chest 2 View  Result Date: 11/03/2017 CLINICAL DATA:  Chest pain EXAM: CHEST  2 VIEW COMPARISON:  None. FINDINGS: The heart size and mediastinal contours are within normal limits. Both lungs are clear. The visualized skeletal structures are unremarkable. IMPRESSION: No active cardiopulmonary disease. Electronically Signed   By: Franchot Gallo M.D.   On: 11/03/2017 18:53   Ct Angio Chest Pe W And/or Wo Contrast  Result Date: 11/03/2017 CLINICAL DATA:  Shortness of breath for the past 5 days. Elevated D-dimer. Evaluate for pulmonary embolism. EXAM: CT ANGIOGRAPHY CHEST WITH CONTRAST TECHNIQUE: Multidetector CT imaging of the chest was performed using the standard protocol during bolus administration of intravenous contrast. Multiplanar CT image reconstructions and MIPs were obtained to evaluate  the vascular anatomy. CONTRAST:  35mL ISOVUE-370 IOPAMIDOL (ISOVUE-370) INJECTION 76% COMPARISON:  Chest x-ray from same day. FINDINGS: Cardiovascular: Satisfactory opacification of the pulmonary arteries to the segmental level. No evidence of pulmonary embolism. Normal heart size. No pericardial effusion. Normal caliber thoracic aorta. Mediastinum/Nodes: No enlarged mediastinal, hilar, or axillary lymph nodes. Thyroid gland, trachea, and esophagus demonstrate no significant findings. Lungs/Pleura: Lungs are clear. No pleural effusion or pneumothorax. Upper Abdomen: No acute abnormality. Dystrophic calcifications within the right liver are nonspecific. Musculoskeletal: Bilateral gynecomastia. No acute or significant osseous findings. Moderate degenerative changes of the lower thoracic spine. Review of the MIP images confirms the above findings. IMPRESSION: 1. No evidence of pulmonary embolism. No acute intrathoracic process. 2. Dystrophic calcifications in the right liver, nonspecific, possibly related to prior granulomatous disease. Consider ultrasound for further evaluation to exclude underlying lesion. Electronically Signed   By: Titus Dubin M.D.   On: 11/03/2017 20:58    Assessment and Plan:   1. Chest pain - troponin x 3 negative - EKG NSR with suspected lead reversal - Pt has risk factors for ACS, including HTN and HLD; he also has a family history of heart disease in both parents. This chest discomfort has typical and atypical features. Given his negative troponins with risk factors, will recommend coronary CT.  Pt is NPO and his HR is 59. Will start IV hydration given his CTA yesterday. Pt will need 18 gauge IV.   2. HTN - home meds include lisinopril-HCTZ   3. HLD - lipids 09/29/17: LDL 131, HDL 46, Triglycerides 143 - not currently on a statin, he is trying diet and exercise first   For questions or updates, please contact [place] Please consult  [email or web address] for contact  info under Cardiology/STEMI.   Signed, Tami Lin Duke, PA  11/04/2017 10:49 AM As above, patient seen and examined.  Briefly he is a 61 year old male with past medical history of hypertension hyperlipidemia, asthma, diverticulitis for evaluation of chest pain.  Patient was shoveling snow approximately 1 week ago and developed dyspnea.  He has been dyspneic both with exertion and at rest since.  Yesterday he developed chest heaviness that did not radiate.  No associated symptoms.  Not pleuritic or positional.  Improved with nitroglycerin.  Admitted and ruled out.  Cardiology asked to evaluate.  Electrocardiogram shows sinus rhythm with no ST changes.  Troponins normal.  We will arrange a cardiac CTA to exclude coronary disease.  If negative he can be discharged and follow-up with primary care.  Kirk Ruths, MD

## 2017-11-05 ENCOUNTER — Inpatient Hospital Stay (HOSPITAL_COMMUNITY): Payer: BLUE CROSS/BLUE SHIELD

## 2017-11-05 ENCOUNTER — Other Ambulatory Visit: Payer: Self-pay | Admitting: Physician Assistant

## 2017-11-05 DIAGNOSIS — K769 Liver disease, unspecified: Secondary | ICD-10-CM | POA: Diagnosis not present

## 2017-11-05 DIAGNOSIS — I361 Nonrheumatic tricuspid (valve) insufficiency: Secondary | ICD-10-CM | POA: Diagnosis not present

## 2017-11-05 DIAGNOSIS — I200 Unstable angina: Secondary | ICD-10-CM | POA: Diagnosis present

## 2017-11-05 DIAGNOSIS — E785 Hyperlipidemia, unspecified: Secondary | ICD-10-CM | POA: Diagnosis present

## 2017-11-05 DIAGNOSIS — E7800 Pure hypercholesterolemia, unspecified: Secondary | ICD-10-CM

## 2017-11-05 DIAGNOSIS — E876 Hypokalemia: Secondary | ICD-10-CM

## 2017-11-05 DIAGNOSIS — Z7982 Long term (current) use of aspirin: Secondary | ICD-10-CM | POA: Diagnosis not present

## 2017-11-05 DIAGNOSIS — Z79899 Other long term (current) drug therapy: Secondary | ICD-10-CM | POA: Diagnosis not present

## 2017-11-05 DIAGNOSIS — Z8249 Family history of ischemic heart disease and other diseases of the circulatory system: Secondary | ICD-10-CM | POA: Diagnosis not present

## 2017-11-05 DIAGNOSIS — Z791 Long term (current) use of non-steroidal anti-inflammatories (NSAID): Secondary | ICD-10-CM | POA: Diagnosis not present

## 2017-11-05 DIAGNOSIS — R072 Precordial pain: Secondary | ICD-10-CM | POA: Diagnosis not present

## 2017-11-05 DIAGNOSIS — I25110 Atherosclerotic heart disease of native coronary artery with unstable angina pectoris: Secondary | ICD-10-CM | POA: Diagnosis not present

## 2017-11-05 DIAGNOSIS — R0600 Dyspnea, unspecified: Secondary | ICD-10-CM

## 2017-11-05 DIAGNOSIS — R079 Chest pain, unspecified: Secondary | ICD-10-CM | POA: Diagnosis not present

## 2017-11-05 DIAGNOSIS — Z880 Allergy status to penicillin: Secondary | ICD-10-CM | POA: Diagnosis not present

## 2017-11-05 DIAGNOSIS — I10 Essential (primary) hypertension: Secondary | ICD-10-CM

## 2017-11-05 LAB — ECHOCARDIOGRAM COMPLETE
Height: 68 in
Weight: 3408 oz

## 2017-11-05 MED ORDER — NITROGLYCERIN 0.4 MG SL SUBL: 0.4000 mg | SUBLINGUAL_TABLET | SUBLINGUAL | 1 refills | Status: DC | PRN

## 2017-11-05 MED ORDER — ROSUVASTATIN CALCIUM 40 MG PO TABS: 40.0000 mg | ORAL_TABLET | Freq: Every day | ORAL | Status: DC

## 2017-11-05 MED ORDER — ROSUVASTATIN CALCIUM 40 MG PO TABS: 40.0000 mg | ORAL_TABLET | Freq: Every day | ORAL | 3 refills | Status: DC

## 2017-11-05 MED ORDER — ROSUVASTATIN CALCIUM 20 MG PO TABS: 20.0000 mg | ORAL_TABLET | Freq: Every day | ORAL | Status: DC

## 2017-11-05 MED ORDER — ALBUTEROL SULFATE (2.5 MG/3ML) 0.083% IN NEBU
2.5000 mg | INHALATION_SOLUTION | RESPIRATORY_TRACT | Status: DC | PRN
  Administered 2017-11-05: 2.5 mg via RESPIRATORY_TRACT
  Filled 2017-11-05: qty 3

## 2017-11-05 NOTE — Progress Notes (Signed)
Reviewed discharge instructions/medications with pt and pt's wife.  Answered their questions. Pt is stable and ready for discharge.

## 2017-11-05 NOTE — Discharge Summary (Signed)
Discharge Summary    Patient ID: Sean Bowers,  MRN: [id number], DOB/AGE: [birth year] 61 y.o.  Admit date: 11/03/2017 Discharge date: 11/05/2017   Primary Care Provider: Shelda Pal Primary Cardiologist: Dr. Stanford Breed  Discharge Diagnoses    Principal Problem:   Chest pain Active Problems:   Essential hypertension   Hypokalemia   Liver lesion, right lobe   Allergies Allergies  Allergen Reactions  . Penicillins Swelling    Swelling of lips Has patient had a PCN reaction causing immediate rash, facial/tongue/throat swelling, SOB or lightheadedness with hypotension: Yes Has patient had a PCN reaction causing severe rash involving mucus membranes or skin necrosis: Yes Has patient had a PCN reaction that required hospitalization: No Has patient had a PCN reaction occurring within the last 10 years: No If all of the above answers are "NO", then may proceed with Cephalosporin use.      History of Present Illness     Sean Bowers is a 61 y.o. male with a hx of HTN, HLD, diverticulitis, asthma, arthritis, and allergies who is being seen today for the evaluation of chest pain.  Mr. Menken presented to [facility] with a 1 week history of dyspnea after shoveling snow last week. Since then, he continued to have shortness of breath at rest and with exertion, stating it seems hard for him to catch his breath and to take a deep breaths.  Yesterday he developed substernal chest heaviness that was new for him. He was at rest when this occurred. Given that they are leaving town for the holidays, he wanted to be evaluated.  In the ED, he received nitro LS x 1 with resolution of his pain. His pain returned and he received a second nitro and morphine, which resolved his pain. Today, he has not had a recurrence of this chest pain. In the ED, EKG with poor R wave progression but NSR. Troponins x 3 negative. D-Dimer was elevated, CTA negative for PE.  Family history includes heart  disease in both parents, both parents had CABG (Mom at 20 yo, Dad in his 4s), no siblings.  He is fairly active and climbs stairs daily at work. He has not had exertional chest pain leading up to this event.   He moved to [facility] from [facility] about 2 years ago. He states he had a treadmill stress test many years ago (10-15 years ago) that was normal. He has never had heart catheterization.   Hospital Course     Consultants: none   Given the patients risk factors and features of typical and atypical chest pain, pt was sent for coronary CT with FFR. Coronary CT with Ca score of 119. Significant plaque was noted in the proximal to mid LAD, but suspect this is mild stenosis. FFR was read as negative. No further ischemic evaluation needed at this time.   Given these results, will start on high intensity statin. Will start crestor 20 mg. Continue ASA and lisinopril-HCTZ.   Pt continues to feel dyspneic. BNP was normal on admission. Echocardiogram obtained and showed normal LVEF, no wall motion abnormality and mild LVH. No further workup from a cardiac standpoint.   Patient seen and examined by Dr. Stanford Breed today and was stable for discharge. All follow up has been arranged.  _____________  Discharge Vitals Blood pressure 135/67, pulse 65, temperature 98 F (36.7 C), temperature source Oral, resp. rate 20, height 5\' 8"  (1.727 m), weight 213 lb (96.6 kg), SpO2 98 %.  Filed Weights  11/03/17 1719 11/04/17 0210 11/05/17 0630  Weight: 215 lb (97.5 kg) 213 lb 6.5 oz (96.8 kg) 213 lb (96.6 kg)    Labs & Radiologic Studies    CBC Recent Labs    11/03/17 1824  WBC 9.1  NEUTROABS 4.8  HGB 15.7  HCT 43.4  MCV 83.3  PLT 124   Basic Metabolic Panel Recent Labs    11/03/17 1824 11/04/17 0310  NA 138  --   K 3.3*  --   CL 103  --   CO2 27  --   GLUCOSE 82  --   BUN 13  --   CREATININE 0.83  --   CALCIUM 9.5  --   MG  --  1.7   Liver Function Tests Recent Labs    11/03/17 1824  AST  26  ALT 29  ALKPHOS 38  BILITOT 1.2  PROT 7.4  ALBUMIN 4.3   No results for input(s): LIPASE, AMYLASE in the last 72 hours. Cardiac Enzymes Recent Labs    11/03/17 2317 11/04/17 0310 11/04/17 0811  TROPONINI <0.03 <0.03 <0.03   BNP Invalid input(s): POCBNP D-Dimer Recent Labs    11/03/17 1825  DDIMER 0.83*   Hemoglobin A1C No results for input(s): HGBA1C in the last 72 hours. Fasting Lipid Panel No results for input(s): CHOL, HDL, LDLCALC, TRIG, CHOLHDL, LDLDIRECT in the last 72 hours. Thyroid Function Tests No results for input(s): TSH, T4TOTAL, T3FREE, THYROIDAB in the last 72 hours.  Invalid input(s): FREET3 _____________  Dg Chest 2 View  Result Date: 11/03/2017 CLINICAL DATA:  Chest pain EXAM: CHEST  2 VIEW COMPARISON:  None. FINDINGS: The heart size and mediastinal contours are within normal limits. Both lungs are clear. The visualized skeletal structures are unremarkable. IMPRESSION: No active cardiopulmonary disease. Electronically Signed   By: Franchot Gallo M.D.   On: 11/03/2017 18:53   Ct Angio Chest Pe W And/or Wo Contrast  Result Date: 11/03/2017 CLINICAL DATA:  Shortness of breath for the past 5 days. Elevated D-dimer. Evaluate for pulmonary embolism. EXAM: CT ANGIOGRAPHY CHEST WITH CONTRAST TECHNIQUE: Multidetector CT imaging of the chest was performed using the standard protocol during bolus administration of intravenous contrast. Multiplanar CT image reconstructions and MIPs were obtained to evaluate the vascular anatomy. CONTRAST:  38mL ISOVUE-370 IOPAMIDOL (ISOVUE-370) INJECTION 76% COMPARISON:  Chest x-ray from same day. FINDINGS: Cardiovascular: Satisfactory opacification of the pulmonary arteries to the segmental level. No evidence of pulmonary embolism. Normal heart size. No pericardial effusion. Normal caliber thoracic aorta. Mediastinum/Nodes: No enlarged mediastinal, hilar, or axillary lymph nodes. Thyroid gland, trachea, and esophagus demonstrate no  significant findings. Lungs/Pleura: Lungs are clear. No pleural effusion or pneumothorax. Upper Abdomen: No acute abnormality. Dystrophic calcifications within the right liver are nonspecific. Musculoskeletal: Bilateral gynecomastia. No acute or significant osseous findings. Moderate degenerative changes of the lower thoracic spine. Review of the MIP images confirms the above findings. IMPRESSION: 1. No evidence of pulmonary embolism. No acute intrathoracic process. 2. Dystrophic calcifications in the right liver, nonspecific, possibly related to prior granulomatous disease. Consider ultrasound for further evaluation to exclude underlying lesion. Electronically Signed   By: Titus Dubin M.D.   On: 11/03/2017 20:58   Ct Coronary Morph W/cta Cor W/score W/ca W/cm &/or Wo/cm  Addendum Date: 11/05/2017   ADDENDUM REPORT: 11/05/2017 10:06 EXAM: OVER-READ INTERPRETATION  CT CHEST The following report is an over-read performed by radiologist Dr. Alvino Blood [facility] on 11/05/2017. This over-read does not include interpretation of cardiac or  coronary anatomy or pathology. The coronary CTA interpretation by the cardiologist is attached. COMPARISON:  Chest CT 11/03/2017 FINDINGS: Limited view of the lung parenchyma demonstrates no suspicious nodularity. Airways are normal. Limited view of the mediastinum demonstrates no adenopathy. Esophagus normal. Limited view of the upper abdomen unremarkable. Limited view of the skeleton and chest wall is unremarkable. IMPRESSION: No significant extracardiac findings. Electronically Signed   By: Suzy Bouchard M.D.   On: 11/05/2017 10:06   Result Date: 11/05/2017 CLINICAL DATA:  Chest pain EXAM: Coronary CT angiogram TECHNIQUE: The patient was scanned on a Siemens 035 slice scanner. Gantry rotation speed was 240 msecs. Collimation was 0.4 mm. A 100 kV prospective scan was triggered in the ascending thoracic aorta at 111 35-75% of the R-R interval.  Average HR during the scan was 60 bpm. The 3D data set was interpreted on a dedicated work station using MPR, MIP and VRT modes. A total of 80cc of contrast was used. FINDINGS: Non-cardiac: See separate report from [facility]. Calcium Score:  119 Agatston units. Coronary Arteries: Right dominant with no anomalies LM:  No plaque or stenosis. LAD system: There is misregistration artifact in the mid LAD but do not think this affects ability to read vessel. Extensive mixed plaque in the proximal to mid LAD. I suspect mild stenosis. Circumflex system: Small ramus without significant disease. Mixed plaque in the proximal and distal LCx without significant stenosis. RCA system: Mixed plaque with mild stenosis in the proximal RCA. Mixed plaque with mild stenosis distal RCA. IMPRESSION: 1. Coronary artery calcium score 119 Agatston units places the patient in the 70th percentile for age and gender, this suggests intermediate risk for future cardiac events. 2. Extensive plaque in the proximal to mid LAD but suspect mild stenosis. Will send FFR to confirm. 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 On: 11/04/2017 17:22   Ct Coronary Fractional Flow Reserve Fluid Analysis  Result Date: 11/05/2017 CLINICAL DATA:  Chest pain EXAM: Cardiac CT FFR MEDICATIONS: None TECHNIQUE: See separate dictation for CT coronary report. FINDINGS: FFR 0.87 LAD FFR 0.94 LCx IMPRESSION: Coronary disease does not appear to be hemodynamically significant. Dalton Mclean Electronically Signed   By: Loralie Champagne M.D.   On: 11/05/2017 13:20     Diagnostic Studies/Procedures    Coronary CT 11/04/17: LM:  No plaque or stenosis.  LAD system: There is misregistration artifact in the mid LAD but do not think this affects ability to read vessel. Extensive mixed plaque in the proximal to mid LAD. I suspect mild stenosis.  Circumflex system: Small ramus without significant disease. Mixed plaque in the proximal  and distal LCx without significant stenosis.  RCA system: Mixed plaque with mild stenosis in the proximal RCA. Mixed plaque with mild stenosis distal RCA.  IMPRESSION: 1. Coronary artery calcium score 119 Agatston units places the patient in the 70th percentile for age and gender, this suggests intermediate risk for future cardiac events.  2. Extensive plaque in the proximal to mid LAD but suspect mild stenosis. FFR was negative.   Echo 11/05/17: Study Conclusions - Left ventricle: The cavity size was normal. Wall thickness was   increased in a pattern of mild LVH. Systolic function was normal.   The estimated ejection fraction was in the range of 55% to 60%.   Wall motion was normal; there were no regional wall motion   abnormalities. Left ventricular diastolic function parameters   were normal. - Aortic valve: Valve area (VTI): 3.8 cm^2. Valve area (  Vmax): 3.21   cm^2. Valve area (Vmean): 3.09 cm^2. - Atrial septum: A patent foramen ovale cannot be excluded.   Disposition   Pt is being discharged home today in good condition.  Follow-up Plans & Appointments   Staff message sent, office will call  Discharge Instructions    Diet - low sodium heart healthy   Complete by:  As directed    Increase activity slowly   Complete by:  As directed       Discharge Medications   Allergies as of 11/05/2017      Reactions   Penicillins Swelling   Swelling of lips Has patient had a PCN reaction causing immediate rash, facial/tongue/throat swelling, SOB or lightheadedness with hypotension: Yes Has patient had a PCN reaction causing severe rash involving mucus membranes or skin necrosis: Yes Has patient had a PCN reaction that required hospitalization: No Has patient had a PCN reaction occurring within the last 10 years: No If all of the above answers are "NO", then may proceed with Cephalosporin use.      Medication List    TAKE these medications   aspirin 81 MG  tablet Take 81 mg by mouth daily.   lisinopril-hydrochlorothiazide 20-25 MG tablet Commonly known as:  PRINZIDE,ZESTORETIC Take 1 tablet by mouth daily.   loratadine 10 MG tablet Commonly known as:  CLARITIN Take 10 mg by mouth daily.   MULTIVITAMIN MEN PO Take by mouth.   nitroGLYCERIN 0.4 MG SL tablet Commonly known as:  NITROSTAT Place 1 tablet (0.4 mg total) under the tongue every 5 (five) minutes x 3 doses as needed for chest pain.   rosuvastatin 40 MG tablet Commonly known as:  CRESTOR Take 1 tablet (40 mg total) by mouth daily at 6 PM.         Outstanding Labs/Studies   Crestor 40 mg started 11/05/17  Will need lipids and LFTs in 4 weeks - order in EPIC  Follow up with Dr. Stanford Breed in 6-8 weeks - staff message sent  Duration of Discharge Encounter   Greater than 30 minutes including physician time.  Signed, Tami Lin Duke PA-C 11/05/2017, 1:43 PM

## 2017-11-05 NOTE — Progress Notes (Signed)
  Echocardiogram 2D Echocardiogram has been performed.  Sean Bowers 11/05/2017, 12:29 PM

## 2017-11-05 NOTE — Discharge Summary (Addendum)
Physician Discharge Summary  Sean Bowers  VOH:[id number]  DOB: 1956/11/13  DOA: 11/03/2017 PCP: Shelda Pal, DO  Admit date: 11/03/2017 Discharge date: 11/05/2017  Admitted From: Home  Disposition:  Home   Recommendations for Outpatient Follow-up:  1. Follow up with PCP in 1-2 weeks 2. Please obtain BMP/CBC in one week to monitor Hgb and Cr  3. Recommend to obtain PFT 4. Obtain a Liver US to evaluate liver calcification   Discharge Condition: Stable  CODE STATUS: Full Code  Diet recommendation: Heart Healthy   Brief/Interim Summary: For full details see H&P/progress note but in brief, Sean Bowers is a 61 year old male with medical history significant for hypertension presented to the emergency department for evaluation of shortness of breath and chest discomfort.  Upon ED evaluation potassium was found to be 3.3 and an elevated d-dimer to 0.83.  CTA of the chest was negative for PE and active pulmonary disease.  Troponin was negative x3 and BMP was normal.  Patient was treated with aspirin, nitro and morphine by the ED and pain subsided.  Patient was admitted for cardiology evaluation.  Patient underwent cardiac CTA which revealed mild plaque of the LAD but cardiology recommended medical treatment, with no further interventions.  Etiology of dyspnea was unclear felt to be psychogenic and since patient vital signs and full workup was negative, patient was deemed stable to be discharged.  Patient advised to follow-up with PCP in 1 week for further evaluation.  Subjective: Patient seen and examined, he continues to have heavy sighing.  Denies chest pain and palpitation.  No dizziness or weakness  Discharge Diagnoses/Hospital Course:  Dyspnea - unclear etiology, No cardiac etiology - ACS ruled out, no PE or pneumonia. Patient with hx of asthma but nebulizer treatment does not help. Maybe psychogenic. On exam his lungs are clears and saturation are normal on RA  ECHO  cardiogram with EF 55-60% - unremarkable  Recommended pulmonary function test as outpatient  Follow up with PCP in 1 week   Chest pain - resolved  EKG nno ST segment elevation, negative troponin, Cardiac CTA mild plaque on LAD, but cardiology not planning for further cardiac evaluation   Coronary plaque  Treat with ASA 81 mg daily, Crestor 40 mg daily - monitor lipid panel in 4 weeks   HTN  BP stable  Continue home medications w/o changes   Dystrophic calcification in the R liver  Liver US as outpatient   All other chronic medical condition were stable during the hospitalization.  On the day of the discharge the patient's vitals were stable, and no other acute medical condition were reported by patient. the patient was felt safe to be discharge to home   Discharge Instructions  You were cared for by a hospitalist during your hospital stay. If you have any questions about your discharge medications or the care you received while you were in the hospital after you are discharged, you can call the unit and asked to speak with the hospitalist on call if the hospitalist that took care of you is not available. Once you are discharged, your primary care physician will handle any further medical issues. Please note that NO REFILLS for any discharge medications will be authorized once you are discharged, as it is imperative that you return to your primary care physician (or establish a relationship with a primary care physician if you do not have one) for your aftercare needs so that they can reassess your need for medications and monitor your  lab values.  Discharge Instructions    Diet - low sodium heart healthy   Complete by:  As directed    Increase activity slowly   Complete by:  As directed      Allergies as of 11/05/2017      Reactions   Penicillins Swelling   Swelling of lips Has patient had a PCN reaction causing immediate rash, facial/tongue/throat swelling, SOB or lightheadedness  with hypotension: Yes Has patient had a PCN reaction causing severe rash involving mucus membranes or skin necrosis: Yes Has patient had a PCN reaction that required hospitalization: No Has patient had a PCN reaction occurring within the last 10 years: No If all of the above answers are "NO", then may proceed with Cephalosporin use.      Medication List    TAKE these medications   aspirin 81 MG tablet Take 81 mg by mouth daily.   lisinopril-hydrochlorothiazide 20-25 MG tablet Commonly known as:  PRINZIDE,ZESTORETIC Take 1 tablet by mouth daily.   loratadine 10 MG tablet Commonly known as:  CLARITIN Take 10 mg by mouth daily.   MULTIVITAMIN MEN PO Take by mouth.   nitroGLYCERIN 0.4 MG SL tablet Commonly known as:  NITROSTAT Place 1 tablet (0.4 mg total) under the tongue every 5 (five) minutes x 3 doses as needed for chest pain.   rosuvastatin 40 MG tablet Commonly known as:  CRESTOR Take 1 tablet (40 mg total) by mouth daily at 6 PM.      Follow-up Information    Shelda Pal, DO. Go on 11/19/2017.   Specialty:  Family Medicine Why:  @11 :15am Contact information: [place] [address] [phone]          Allergies  Allergen Reactions  . Penicillins Swelling    Swelling of lips Has patient had a PCN reaction causing immediate rash, facial/tongue/throat swelling, SOB or lightheadedness with hypotension: Yes Has patient had a PCN reaction causing severe rash involving mucus membranes or skin necrosis: Yes Has patient had a PCN reaction that required hospitalization: No Has patient had a PCN reaction occurring within the last 10 years: No If all of the above answers are "NO", then may proceed with Cephalosporin use.     Consultations:  Cardiology   Procedures/Studies: Dg Chest 2 View  Result Date: 11/03/2017 CLINICAL DATA:  Chest pain EXAM: CHEST  2 VIEW COMPARISON:  None. FINDINGS: The heart size and mediastinal  contours are within normal limits. Both lungs are clear. The visualized skeletal structures are unremarkable. IMPRESSION: No active cardiopulmonary disease. Electronically Signed   By: Franchot Gallo M.D.   On: 11/03/2017 18:53   Ct Angio Chest Pe W And/or Wo Contrast  Result Date: 11/03/2017 CLINICAL DATA:  Shortness of breath for the past 5 days. Elevated D-dimer. Evaluate for pulmonary embolism. EXAM: CT ANGIOGRAPHY CHEST WITH CONTRAST TECHNIQUE: Multidetector CT imaging of the chest was performed using the standard protocol during bolus administration of intravenous contrast. Multiplanar CT image reconstructions and MIPs were obtained to evaluate the vascular anatomy. CONTRAST:  36mL ISOVUE-370 IOPAMIDOL (ISOVUE-370) INJECTION 76% COMPARISON:  Chest x-ray from same day. FINDINGS: Cardiovascular: Satisfactory opacification of the pulmonary arteries to the segmental level. No evidence of pulmonary embolism. Normal heart size. No pericardial effusion. Normal caliber thoracic aorta. Mediastinum/Nodes: No enlarged mediastinal, hilar, or axillary lymph nodes. Thyroid gland, trachea, and esophagus demonstrate no significant findings. Lungs/Pleura: Lungs are clear. No pleural effusion or pneumothorax. Upper Abdomen: No acute abnormality. Dystrophic  calcifications within the right liver are nonspecific. Musculoskeletal: Bilateral gynecomastia. No acute or significant osseous findings. Moderate degenerative changes of the lower thoracic spine. Review of the MIP images confirms the above findings. IMPRESSION: 1. No evidence of pulmonary embolism. No acute intrathoracic process. 2. Dystrophic calcifications in the right liver, nonspecific, possibly related to prior granulomatous disease. Consider ultrasound for further evaluation to exclude underlying lesion. Electronically Signed   By: Titus Dubin M.D.   On: 11/03/2017 20:58   Ct Coronary Morph W/cta Cor W/score W/ca W/cm &/or Wo/cm  Addendum Date: 11/05/2017    ADDENDUM REPORT: 11/05/2017 10:06 EXAM: OVER-READ INTERPRETATION  CT CHEST The following report is an over-read performed by radiologist Dr. Alvino Blood [facility] on 11/05/2017. This over-read does not include interpretation of cardiac or coronary anatomy or pathology. The coronary CTA interpretation by the cardiologist is attached. COMPARISON:  Chest CT 11/03/2017 FINDINGS: Limited view of the lung parenchyma demonstrates no suspicious nodularity. Airways are normal. Limited view of the mediastinum demonstrates no adenopathy. Esophagus normal. Limited view of the upper abdomen unremarkable. Limited view of the skeleton and chest wall is unremarkable. IMPRESSION: No significant extracardiac findings. Electronically Signed   By: Suzy Bouchard M.D.   On: 11/05/2017 10:06   Result Date: 11/05/2017 CLINICAL DATA:  Chest pain EXAM: Coronary CT angiogram TECHNIQUE: The patient was scanned on a Siemens 627 slice scanner. Gantry rotation speed was 240 msecs. Collimation was 0.4 mm. A 100 kV prospective scan was triggered in the ascending thoracic aorta at 111 35-75% of the R-R interval. Average HR during the scan was 60 bpm. The 3D data set was interpreted on a dedicated work station using MPR, MIP and VRT modes. A total of 80cc of contrast was used. FINDINGS: Non-cardiac: See separate report from [facility]. Calcium Score:  119 Agatston units. Coronary Arteries: Right dominant with no anomalies LM:  No plaque or stenosis. LAD system: There is misregistration artifact in the mid LAD but do not think this affects ability to read vessel. Extensive mixed plaque in the proximal to mid LAD. I suspect mild stenosis. Circumflex system: Small ramus without significant disease. Mixed plaque in the proximal and distal LCx without significant stenosis. RCA system: Mixed plaque with mild stenosis in the proximal RCA. Mixed plaque with mild stenosis distal RCA. IMPRESSION: 1. Coronary artery  calcium score 119 Agatston units places the patient in the 70th percentile for age and gender, this suggests intermediate risk for future cardiac events. 2. Extensive plaque in the proximal to mid LAD but suspect mild stenosis. Will send FFR to confirm. 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 On: 11/04/2017 17:22   Ct Coronary Fractional Flow Reserve Fluid Analysis  Result Date: 11/05/2017 CLINICAL DATA:  Chest pain EXAM: Cardiac CT FFR MEDICATIONS: None TECHNIQUE: See separate dictation for CT coronary report. FINDINGS: FFR 0.87 LAD FFR 0.94 LCx IMPRESSION: Coronary disease does not appear to be hemodynamically significant. [facility] Mclean Electronically Signed   By: Loralie Champagne M.D.   On: 11/05/2017 13:20    ECHO 11/04/18 ------------------------------------------------------------------- Study Conclusions  - Left ventricle: The cavity size was normal. Wall thickness was   increased in a pattern of mild LVH. Systolic function was normal.   The estimated ejection fraction was in the range of 55% to 60%.   Wall motion was normal; there were no regional wall motion   abnormalities. Left ventricular diastolic function parameters   were normal. - Aortic valve: Valve area (VTI): 3.8 cm^2.  Valve area (Vmax): 3.21   cm^2. Valve area (Vmean): 3.09 cm^2. - Atrial septum: A patent foramen ovale cannot be excluded.   Discharge Exam: Vitals:   11/05/17 1139 11/05/17 1151  BP:  135/67  Pulse:  65  Resp:  20  Temp:  98 F (36.7 C)  SpO2: 98% 98%   Vitals:   11/05/17 0630 11/05/17 0845 11/05/17 1139 11/05/17 1151  BP: 124/77 133/76  135/67  Pulse: 73 82  65  Resp: 18   20  Temp: 97.6 F (36.4 C)   98 F (36.7 C)  TempSrc: Oral   Oral  SpO2: 97% 98% 98% 98%  Weight: 96.6 kg (213 lb)     Height:        General: NAD  Cardiovascular: S1S2 RRR no mumurs  Respiratory: CTA bilaterally, no wheezing, no rhonchi Abdominal: Soft, NT, ND, bowel sounds + Extremities: no  edema  The results of significant diagnostics from this hospitalization (including imaging, microbiology, ancillary and laboratory) are listed below for reference.     Microbiology: No results found for this or any previous visit (from the past 240 hour(s)).   Labs: BNP (last 3 results) Recent Labs    11/03/17 1825  BNP 8.2   Basic Metabolic Panel: Recent Labs  Lab 11/03/17 1824 11/04/17 0310  NA 138  --   K 3.3*  --   CL 103  --   CO2 27  --   GLUCOSE 82  --   BUN 13  --   CREATININE 0.83  --   CALCIUM 9.5  --   MG  --  1.7   Liver Function Tests: Recent Labs  Lab 11/03/17 1824  AST 26  ALT 29  ALKPHOS 38  BILITOT 1.2  PROT 7.4  ALBUMIN 4.3   No results for input(s): LIPASE, AMYLASE in the last 168 hours. No results for input(s): AMMONIA in the last 168 hours. CBC: Recent Labs  Lab 11/03/17 1824  WBC 9.1  NEUTROABS 4.8  HGB 15.7  HCT 43.4  MCV 83.3  PLT 180   Cardiac Enzymes: Recent Labs  Lab 11/03/17 1825 11/03/17 2317 11/04/17 0310 11/04/17 0811  TROPONINI <0.03 <0.03 <0.03 <0.03   BNP: Invalid input(s): POCBNP CBG: No results for input(s): GLUCAP in the last 168 hours. D-Dimer Recent Labs    11/03/17 1825  DDIMER 0.83*   Hgb A1c No results for input(s): HGBA1C in the last 72 hours. Lipid Profile No results for input(s): CHOL, HDL, LDLCALC, TRIG, CHOLHDL, LDLDIRECT in the last 72 hours. Thyroid function studies No results for input(s): TSH, T4TOTAL, T3FREE, THYROIDAB in the last 72 hours.  Invalid input(s): FREET3 Anemia work up No results for input(s): VITAMINB12, FOLATE, FERRITIN, TIBC, IRON, RETICCTPCT in the last 72 hours. Urinalysis No results found for: COLORURINE, APPEARANCEUR, LABSPEC, [place], GLUCOSEU, HGBUR, BILIRUBINUR, KETONESUR, PROTEINUR, UROBILINOGEN, NITRITE, LEUKOCYTESUR Sepsis Labs Invalid input(s): PROCALCITONIN,  WBC,  LACTICIDVEN Microbiology No results found for this or any previous visit (from the past  240 hour(s)).   Time coordinating discharge: 32 minutes  SIGNED:  Chipper Oman, MD  [facility] 11/05/2017, 1:58 PM  Pager please text page via  [email or web address] Password [email or web address]

## 2017-11-05 NOTE — Progress Notes (Signed)
Progress Note  Patient Name: Sean Bowers Date of Encounter: 11/05/2017  Primary Cardiologist: Dr Stanford Breed  Subjective   Complains of dyspnea both at rest and with exertion; no chest pain  Inpatient Medications    Scheduled Meds: . aspirin  81 mg Oral Daily  . enoxaparin (LOVENOX) injection  40 mg Subcutaneous Q24H  . lisinopril  20 mg Oral Daily   And  . hydrochlorothiazide  25 mg Oral Daily  . rosuvastatin  20 mg Oral q1800   Continuous Infusions:  PRN Meds: acetaminophen, ALPRAZolam, cyclobenzaprine, morphine injection, nitroGLYCERIN, ondansetron (ZOFRAN) IV   Vital Signs    Vitals:   11/04/17 1153 11/04/17 1944 11/05/17 0630 11/05/17 0845  BP: 115/70 116/70 124/77 133/76  Pulse: (!) 59 76 73 82  Resp: 18 18 18    Temp: 97.7 F (36.5 C) 98.1 F (36.7 C) 97.6 F (36.4 C)   TempSrc: Oral Oral Oral   SpO2: 97% 97% 97% 98%  Weight:   213 lb (96.6 kg)   Height:        Intake/Output Summary (Last 24 hours) at 11/05/2017 1036 Last data filed at 11/05/2017 0859 Gross per 24 hour  Intake 1360 ml  Output 500 ml  Net 860 ml   Filed Weights   11/03/17 1719 11/04/17 0210 11/05/17 0630  Weight: 215 lb (97.5 kg) 213 lb 6.5 oz (96.8 kg) 213 lb (96.6 kg)    Telemetry    Sinus- Personally Reviewed   Physical Exam   GEN: No acute distress.   Neck: No JVD Cardiac: RRR, no murmurs, rubs, or gallops.  Respiratory: Clear to auscultation bilaterally. GI: Soft, nontender, non-distended  MS: No edema Neuro:  Nonfocal  Psych: Normal affect   Labs    Chemistry Recent Labs  Lab 11/03/17 1824  NA 138  K 3.3*  CL 103  CO2 27  GLUCOSE 82  BUN 13  CREATININE 0.83  CALCIUM 9.5  PROT 7.4  ALBUMIN 4.3  AST 26  ALT 29  ALKPHOS 38  BILITOT 1.2  GFRNONAA >60  GFRAA >60  ANIONGAP 8     Hematology Recent Labs  Lab 11/03/17 1824  WBC 9.1  RBC 5.21  HGB 15.7  HCT 43.4  MCV 83.3  MCH 30.1  MCHC 36.2*  RDW 12.6  PLT 180    Cardiac  Enzymes Recent Labs  Lab 11/03/17 1825 11/03/17 2317 11/04/17 0310 11/04/17 0811  TROPONINI <0.03 <0.03 <0.03 <0.03    BNP Recent Labs  Lab 11/03/17 1825  BNP 8.2     DDimer  Recent Labs  Lab 11/03/17 1825  DDIMER 0.83*     Radiology    Dg Chest 2 View  Result Date: 11/03/2017 CLINICAL DATA:  Chest pain EXAM: CHEST  2 VIEW COMPARISON:  None. FINDINGS: The heart size and mediastinal contours are within normal limits. Both lungs are clear. The visualized skeletal structures are unremarkable. IMPRESSION: No active cardiopulmonary disease. Electronically Signed   By: Franchot Gallo M.D.   On: 11/03/2017 18:53   Ct Angio Chest Pe W And/or Wo Contrast  Result Date: 11/03/2017 CLINICAL DATA:  Shortness of breath for the past 5 days. Elevated D-dimer. Evaluate for pulmonary embolism. EXAM: CT ANGIOGRAPHY CHEST WITH CONTRAST TECHNIQUE: Multidetector CT imaging of the chest was performed using the standard protocol during bolus administration of intravenous contrast. Multiplanar CT image reconstructions and MIPs were obtained to evaluate the vascular anatomy. CONTRAST:  51mL ISOVUE-370 IOPAMIDOL (ISOVUE-370) INJECTION 76% COMPARISON:  Chest x-ray from same  day. FINDINGS: Cardiovascular: Satisfactory opacification of the pulmonary arteries to the segmental level. No evidence of pulmonary embolism. Normal heart size. No pericardial effusion. Normal caliber thoracic aorta. Mediastinum/Nodes: No enlarged mediastinal, hilar, or axillary lymph nodes. Thyroid gland, trachea, and esophagus demonstrate no significant findings. Lungs/Pleura: Lungs are clear. No pleural effusion or pneumothorax. Upper Abdomen: No acute abnormality. Dystrophic calcifications within the right liver are nonspecific. Musculoskeletal: Bilateral gynecomastia. No acute or significant osseous findings. Moderate degenerative changes of the lower thoracic spine. Review of the MIP images confirms the above findings. IMPRESSION: 1.  No evidence of pulmonary embolism. No acute intrathoracic process. 2. Dystrophic calcifications in the right liver, nonspecific, possibly related to prior granulomatous disease. Consider ultrasound for further evaluation to exclude underlying lesion. Electronically Signed   By: Titus Dubin M.D.   On: 11/03/2017 20:58   Ct Coronary Morph W/cta Cor W/score W/ca W/cm &/or Wo/cm  Addendum Date: 11/05/2017   ADDENDUM REPORT: 11/05/2017 10:06 EXAM: OVER-READ INTERPRETATION  CT CHEST The following report is an over-read performed by radiologist Dr. Alvino Blood [facility] on 11/05/2017. This over-read does not include interpretation of cardiac or coronary anatomy or pathology. The coronary CTA interpretation by the cardiologist is attached. COMPARISON:  Chest CT 11/03/2017 FINDINGS: Limited view of the lung parenchyma demonstrates no suspicious nodularity. Airways are normal. Limited view of the mediastinum demonstrates no adenopathy. Esophagus normal. Limited view of the upper abdomen unremarkable. Limited view of the skeleton and chest wall is unremarkable. IMPRESSION: No significant extracardiac findings. Electronically Signed   By: Suzy Bouchard M.D.   On: 11/05/2017 10:06   Result Date: 11/05/2017 CLINICAL DATA:  Chest pain EXAM: Coronary CT angiogram TECHNIQUE: The patient was scanned on a Siemens 784 slice scanner. Gantry rotation speed was 240 msecs. Collimation was 0.4 mm. A 100 kV prospective scan was triggered in the ascending thoracic aorta at 111 35-75% of the R-R interval. Average HR during the scan was 60 bpm. The 3D data set was interpreted on a dedicated work station using MPR, MIP and VRT modes. A total of 80cc of contrast was used. FINDINGS: Non-cardiac: See separate report from [facility]. Calcium Score:  119 Agatston units. Coronary Arteries: Right dominant with no anomalies LM:  No plaque or stenosis. LAD system: There is misregistration artifact in the mid  LAD but do not think this affects ability to read vessel. Extensive mixed plaque in the proximal to mid LAD. I suspect mild stenosis. Circumflex system: Small ramus without significant disease. Mixed plaque in the proximal and distal LCx without significant stenosis. RCA system: Mixed plaque with mild stenosis in the proximal RCA. Mixed plaque with mild stenosis distal RCA. IMPRESSION: 1. Coronary artery calcium score 119 Agatston units places the patient in the 70th percentile for age and gender, this suggests intermediate risk for future cardiac events. 2. Extensive plaque in the proximal to mid LAD but suspect mild stenosis. Will send FFR to confirm. 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 On: 11/04/2017 17:22    Patient Profile     61 y.o. male with past medical history of hypertension hyperlipidemia, asthma, diverticulitis with chest pain.      Assessment & Plan    1 chest pain-no further symptoms.  Electrocardiogram showed no ST changes.  Enzymes negative.  CT showed no pulmonary embolus.  Cardiac CTA showed elevated calcium score but mild plaque and no obstructive disease.  No plans for further cardiac evaluation.  2 dyspnea-etiology unclear.  No pulmonary  embolus on CT.  Prior BNP normal.  Will check echocardiogram for LV function.  If negative no plans for further ischemia evaluation.  Patient states he is dyspneic at time of evaluation but does not appear to be in distress.  3 coronary plaque/calcification-plan to treat with aspirin 81 mg daily and add Crestor 40 mg daily.  Check lipids and liver in 4 weeks.  4 hypertension-blood pressure is controlled.  Continue preadmission medications.  5 hyperlipidemia-we will treat with Crestor 40 mg daily.  Lipids and liver in 4 weeks.  If LV function normal patient can be discharged from a cardiac standpoint.  He can follow-up with me for cardiac issues in 6-8 weeks.  For questions or updates, please contact [place] Please consult [email or web address] for contact info under Cardiology/STEMI.      Signed, Kirk Ruths, MD  11/05/2017, 10:36 AM

## 2017-11-06 ENCOUNTER — Other Ambulatory Visit: Payer: Self-pay | Admitting: Family Medicine

## 2017-11-06 ENCOUNTER — Telehealth: Payer: Self-pay

## 2017-11-06 DIAGNOSIS — E876 Hypokalemia: Secondary | ICD-10-CM

## 2017-11-06 NOTE — Telephone Encounter (Signed)
Patient informed of response/instructions. Scheduled lab appt in one week/put in order

## 2017-11-06 NOTE — Telephone Encounter (Signed)
OK to order CBC and BMP in around 1 week. He does not need to be fasting. We will be rechecking his potassium in these labs, but I did notice it was slightly low in the hospital. TY.

## 2017-11-06 NOTE — Telephone Encounter (Signed)
11/06/17  TCM Hospital Follow Up  Transition Care Management Follow-up Telephone Call  ADMISSION DATE: 11/03/17  DISCHARGE DATE: 11/05/17  NOTE: BMP and CBC ordered also states to Monitor Hgb and Creatinine   How have you been since you were released from the hospital?  Patient states he is feeling good since discharge   Do you understand why you were in the hospital? Yes   Do you understand the discharge instrcutions? Yes    Items Reviewed:  Medications reviewed:  Yes   Allergies reviewed: Yes, PCN   Dietary changes reviewed: Low Sodium Heart Healthy   Referrals reviewed: Appointment scheduled with Dr. Nani Ravens for Hospital Follow Up   Functional Questionnaire:   Activities of Daily Living (ADLs): Patient can perform all independently.  Any patient concerns?  Patient concerned that there may be a problem with his Potassium states he may need supliment. States he would like to have his BMP and CBC drawn at his job on the [address] prior to his H F/U appointment. States they usually draw his labs there. States that have access to his chart.   Confirmed importance and date/time of follow-up visits scheduled: Yes   Confirmed with patient if condition begins to worsen call PCP or go to the ER. Yes    Patient was given the office number and encouragred to call back with questions or concerns. Yes

## 2017-11-06 NOTE — Telephone Encounter (Signed)
Copied from [place] [id number]. Topic: General - Other >> Nov 06, 2017  2:26 PM Sean Bowers wrote: Reason for CRM: Patient needs lab work per hospital d/c, patient is scheduled for hospital follow up for 11/20/17. Can he have his lab work prior to appt? Please advise.   Also patient wants Dr Nani Ravens to review potassium levels from hospital

## 2017-11-13 ENCOUNTER — Other Ambulatory Visit: Payer: BLUE CROSS/BLUE SHIELD

## 2017-11-13 ENCOUNTER — Other Ambulatory Visit (INDEPENDENT_AMBULATORY_CARE_PROVIDER_SITE_OTHER): Payer: BLUE CROSS/BLUE SHIELD

## 2017-11-13 DIAGNOSIS — E876 Hypokalemia: Secondary | ICD-10-CM

## 2017-11-13 LAB — BASIC METABOLIC PANEL
BUN: 15 mg/dL (ref 6–23)
CO2: 30 mEq/L (ref 19–32)
CREATININE: 1 mg/dL (ref 0.40–1.50)
Calcium: 9 mg/dL (ref 8.4–10.5)
Chloride: 101 mEq/L (ref 96–112)
GFR: 80.85 mL/min (ref >60.00)
Glucose, Bld: 96 mg/dL (ref 70–99)
POTASSIUM: 3.8 meq/L (ref 3.5–5.1)
Sodium: 138 mEq/L (ref 135–145)

## 2017-11-13 LAB — CBC
HCT: 45.4 % (ref 39.0–52.0)
HEMOGLOBIN: 15.5 g/dL (ref 13.0–17.0)
MCHC: 34.2 g/dL (ref 30.0–36.0)
MCV: 88.8 fl (ref 78.0–100.0)
Platelets: 210 10*3/uL (ref 150.0–400.0)
RBC: 5.11 Mil/uL (ref 4.22–5.81)
RDW: 13.2 % (ref 11.5–15.5)
WBC: 7.7 10*3/uL (ref 4.0–10.5)

## 2017-11-19 ENCOUNTER — Inpatient Hospital Stay: Payer: BLUE CROSS/BLUE SHIELD | Admitting: Family Medicine

## 2017-11-20 ENCOUNTER — Encounter: Payer: Self-pay | Admitting: Family Medicine

## 2017-11-20 ENCOUNTER — Ambulatory Visit: Payer: BLUE CROSS/BLUE SHIELD | Admitting: Family Medicine

## 2017-11-20 VITALS — BP 110/70 | HR 80 | Temp 98.5°F | Ht 68.0 in | Wt 217.1 lb

## 2017-11-20 DIAGNOSIS — G4733 Obstructive sleep apnea (adult) (pediatric): Secondary | ICD-10-CM

## 2017-11-20 DIAGNOSIS — Z9989 Dependence on other enabling machines and devices: Secondary | ICD-10-CM | POA: Diagnosis not present

## 2017-11-20 DIAGNOSIS — R0600 Dyspnea, unspecified: Secondary | ICD-10-CM | POA: Diagnosis not present

## 2017-11-20 DIAGNOSIS — J4520 Mild intermittent asthma, uncomplicated: Secondary | ICD-10-CM | POA: Diagnosis not present

## 2017-11-20 MED ORDER — ALBUTEROL SULFATE 108 (90 BASE) MCG/ACT IN AEPB: 1.0000 | INHALATION_SPRAY | Freq: Four times a day (QID) | RESPIRATORY_TRACT | 1 refills | Status: DC | PRN

## 2017-11-20 NOTE — Progress Notes (Signed)
Pre visit review using our clinic review tool, if applicable. No additional management support is needed unless otherwise documented below in the visit note. 

## 2017-11-20 NOTE — Progress Notes (Addendum)
Chief Complaint  Patient presents with  . Hospitalization Follow-up    SOB    Subjective: Patient is a 62 y.o. male here for hospital f/u.  Admitted from 12/17-12/19 for dyspnea.  He was under the service of the cardiology team.  Troponins were negative and labs were unremarkable.  CTA was negative for PE or other pulmonary disease.  Mild plaque.  Since being discharged, he states he is around 90% better, however has not continued to improve.  He does have a history of asthma.  He also has a history of OSA and should be on CPAP, however has not been wearing it over the past 4 years.  He denies any chest pain, vision changes, jaw pain, arm pain, or swelling in his legs.  Exertion does not appear to make it worse.  ROS: Heart: Denies chest pain  Lungs: +SOB   Family History  Problem Relation Age of Onset  . Diabetes Mother   . Heart disease Mother   . Hypertension Mother   . Breast cancer Mother   . Arthritis Mother   . Cancer Mother        breast  . Hyperlipidemia Mother   . Heart disease Father   . Stroke Father   . Arthritis Father   . Cancer Father        prostate  . Hyperlipidemia Father   . Heart disease Paternal Grandmother   . Cancer Maternal Uncle        lung  . Cancer Paternal Aunt        lung  . Cancer Paternal Uncle        lung   Past Medical History:  Diagnosis Date  . Allergy   . Arthritis   . Asthma    no inhalers  . Diverticulitis 09/2016   seen in Urgent Care  . Genital warts   . History of chicken pox   . History of hay fever   . Hyperlipidemia 09/27/2016  . Hypertension    Allergies  Allergen Reactions  . Penicillins Swelling    Swelling of lips Has patient had a PCN reaction causing immediate rash, facial/tongue/throat swelling, SOB or lightheadedness with hypotension: Yes Has patient had a PCN reaction causing severe rash involving mucus membranes or skin necrosis: Yes Has patient had a PCN reaction that required hospitalization: No Has  patient had a PCN reaction occurring within the last 10 years: No If all of the above answers are "NO", then may proceed with Cephalosporin use.     Current Outpatient Medications:  .  aspirin 81 MG tablet, Take 81 mg by mouth daily., Disp: , Rfl:  .  lisinopril-hydrochlorothiazide (PRINZIDE,ZESTORETIC) 20-25 MG tablet, Take 1 tablet by mouth daily., Disp: 90 tablet, Rfl: 0 .  loratadine (CLARITIN) 10 MG tablet, Take 10 mg by mouth daily., Disp: , Rfl:  .  Multiple Vitamins-Minerals (MULTIVITAMIN MEN PO), Take by mouth., Disp: , Rfl:  .  nitroGLYCERIN (NITROSTAT) 0.4 MG SL tablet, Place 1 tablet (0.4 mg total) under the tongue every 5 (five) minutes x 3 doses as needed for chest pain., Disp: 25 tablet, Rfl: 1 .  rosuvastatin (CRESTOR) 40 MG tablet, Take 1 tablet (40 mg total) by mouth daily at 6 PM., Disp: 30 tablet, Rfl: 3  Objective: BP 110/70 (BP Location: Left Arm, Patient Position: Sitting, Cuff Size: Large)   Pulse 80   Temp 98.5 F (36.9 C) (Oral)   Ht 5\' 8"  (1.727 m)   Wt 217 lb 2  oz (98.5 kg)   SpO2 94%   BMI 33.01 kg/m  General: Awake, appears stated age HEENT: MMM, EOMi, ears neg, nares neg Heart: RRR, no bruits, trace LE edema (wearing compression hose) Lungs: CTAB, no rales, wheezes or rhonchi. No accessory muscle use Psych: Age appropriate judgment and insight, normal affect and mood  Assessment and Plan: Dyspnea, unspecified type - Plan: Albuterol Sulfate 108 (90 Base) MCG/ACT AEPB  Mild intermittent asthma without complication - Plan: Albuterol Sulfate 108 (90 Base) MCG/ACT AEPB  OSA on CPAP - Plan: Ambulatory referral to Pulmonology  Orders as above. He does have a remote history of asthma.  He recently had an extensive workup in the hospital and cardiac etiology was ruled out.  I will start him on a trial of a SABA.  This will hopefully be diagnostic and therapeutic.  If no improvement, we will order PFTs. He has a history of OSA on CPAP.  He has been  noncompliant with his CPAP over the past 3-4 years.  He is interested in using it again. Follow-up in 4 weeks. The patient voiced understanding and agreement to the plan.  [place], DO 11/20/17  12:08 PM

## 2017-11-20 NOTE — Patient Instructions (Addendum)
Use the inhaler as much as you wish. In general, use it every 4-6 hours as needed.  If you do not hear anything about your referral in the next 1-2 weeks, call our office and ask for an update.  Let us know if you need anything.

## 2017-12-01 ENCOUNTER — Ambulatory Visit: Payer: Self-pay | Admitting: Medical

## 2017-12-01 VITALS — BP 145/76 | HR 92 | Temp 98.6°F | Resp 18 | Wt 222.0 lb

## 2017-12-01 DIAGNOSIS — J069 Acute upper respiratory infection, unspecified: Secondary | ICD-10-CM

## 2017-12-01 DIAGNOSIS — R059 Cough, unspecified: Secondary | ICD-10-CM

## 2017-12-01 DIAGNOSIS — R05 Cough: Secondary | ICD-10-CM

## 2017-12-01 MED ORDER — AZITHROMYCIN 250 MG PO TABS
ORAL_TABLET | ORAL | 0 refills | Status: DC
Start: 2017-12-01 — End: 2018-01-12

## 2017-12-01 MED ORDER — BENZONATATE 100 MG PO CAPS: 200.0000 mg | ORAL_CAPSULE | Freq: Three times a day (TID) | ORAL | 0 refills | Status: DC | PRN

## 2017-12-01 NOTE — Progress Notes (Signed)
Subjective:    Patient ID: Sean Bowers, male    DOB: December 03, 1955, 62 y.o.   MRN: [id number]  HPI  62 yo male in non acuate distress with cough and   Runny nose and  nasal congestion since cough with yellow phelgm Saturday. Taking tessalon perles with some relief. Denies fever or chills. Denies shortness of breath and chest pain.   Review of Systems  Constitutional: Negative for chills, fatigue and fever.  HENT: Positive for congestion, postnasal drip, rhinorrhea, sinus pressure and voice change (little hoarse). Negative for ear pain, sinus pain and sore throat.   Eyes: Negative for discharge and itching.  Respiratory: Positive for cough, chest tightness and wheezing. Negative for shortness of breath.   Cardiovascular: Negative for chest pain, palpitations and leg swelling.  Gastrointestinal: Negative for abdominal pain.  Endocrine: Negative for polydipsia, polyphagia and polyuria.  Genitourinary: Negative for dysuria.  Musculoskeletal: Negative for myalgias.  Skin: Negative for rash.  Neurological: Negative for dizziness, speech difficulty and light-headedness (coughing spell last night , lasting  10 secs  stopped after coughing.).   Has inhaler one month ago hospitalized 2.5 days for shortness of breath, heart ( nuclear stress test and echo and all came back normal.  and chest came back negative.   Sporadic, follow up with in [facility] with his family doctorDr/ Sean Bowers,.Given inhaler (red inhaler). Doesn't feel it helps much. Used it Saturday , did not feel it helped much. Objective:   Physical Exam  Constitutional: He is oriented to person, place, and time. He appears well-developed and well-nourished.  HENT:  Head: Normocephalic and atraumatic.  Right Ear: Hearing, external ear and ear canal normal. A middle ear effusion is present.  Left Ear: Hearing, external ear and ear canal normal. A middle ear effusion is present.  Nose: Mucosal edema and rhinorrhea present.   Mouth/Throat: Oropharynx is clear and moist.  Eyes: Conjunctivae and EOM are normal. Pupils are equal, round, and reactive to light.  Neck: Normal range of motion. Neck supple.  Cardiovascular: Normal rate and regular rhythm. Exam reveals no gallop and no friction rub.  No murmur heard. Pulmonary/Chest: Effort normal and breath sounds normal.  Musculoskeletal: Normal range of motion.  Lymphadenopathy:    He has no cervical adenopathy.  Neurological: He is alert and oriented to person, place, and time.  Skin: Skin is warm and dry.  Psychiatric: He has a normal mood and affect. His behavior is normal. Judgment and thought content normal.  Nursing note and vitals reviewed.   Erythema inside nares bilaterally.      Assessment & Plan:  Upper Respiratory / cough  Stop psedudofed : Dayquil and Nyquil due to blood pressure medication. Use inhaler every 6 hours for cough , shortness of breath or wheezing. Meds ordered this encounter  Medications  . benzonatate (TESSALON PERLES) 100 MG capsule    Sig: Take 2 capsules (200 mg total) by mouth 3 (three) times daily as needed for cough.    Dispense:  30 capsule    Refill:  0  . azithromycin (ZITHROMAX) 250 MG tablet    Sig: Take 2 tablets today by mouth then one tablet days 2-5 with food.    Dispense:  6 tablet    Refill:  0  Patient shares with me his daughter has Leukemia ( T-Cell) just diagnoised last Wednesday. Patient tearful while talking. Daughter moved in with him and his wife,  She has a  3 and 55 yo childrem.  She cannot  climb the stairs and now the living rooms has now become her bedroom. Patient given Sean Bowers work life information to return to [facility] in 3-5 days if not improving or if any other concerns. Thankful for me listening and getting information for counseling. Patient verbalizes understanding and has no questions at discharge.reviewed with patient to take care of himself and the importance of his well being.

## 2017-12-01 NOTE — Patient Instructions (Signed)
Stop Dayquil and Nyquil due to Sudafed/ may take OTC plain Mucinex take as directed.       Cough, Adult A cough helps to clear your throat and lungs. A cough may last only 2-3 weeks (acute), or it may last longer than 8 weeks (chronic). Many different things can cause a cough. A cough may be a sign of an illness or another medical condition. Follow these instructions at home:  Pay attention to any changes in your cough.  Take medicines only as told by your doctor. ? If you were prescribed an antibiotic medicine, take it as told by your doctor. Do not stop taking it even if you start to feel better. ? Talk with your doctor before you try using a cough medicine.  Drink enough fluid to keep your pee (urine) clear or pale yellow.  If the air is dry, use a cold steam vaporizer or humidifier in your home.  Stay away from things that make you cough at work or at home.  If your cough is worse at night, try using extra pillows to raise your head up higher while you sleep.  Do not smoke, and try not to be around smoke. If you need help quitting, ask your doctor.  Do not have caffeine.  Do not drink alcohol.  Rest as needed. Contact a doctor if:  You have new problems (symptoms).  You cough up yellow fluid (pus).  Your cough does not get better after 2-3 weeks, or your cough gets worse.  Medicine does not help your cough and you are not sleeping well.  You have pain that gets worse or pain that is not helped with medicine.  You have a fever.  You are losing weight and you do not know why.  You have night sweats. Get help right away if:  You cough up blood.  You have trouble breathing.  Your heartbeat is very fast. This information is not intended to replace advice given to you by your health care provider. Make sure you discuss any questions you have with your health care provider. Document Released: 07/18/2011 Document Revised: 04/11/2016 Document Reviewed:  01/11/2015 Elsevier Interactive Patient Education  2018 [employer].  Upper Respiratory Infection, Adult Most upper respiratory infections (URIs) are caused by a virus. A URI affects the nose, throat, and upper air passages. The most common type of URI is often called "the common cold." Follow these instructions at home:  Take medicines only as told by your doctor.  Gargle warm saltwater or take cough drops to comfort your throat as told by your doctor.  Use a warm mist humidifier or inhale steam from a shower to increase air moisture. This may make it easier to breathe.  Drink enough fluid to keep your pee (urine) clear or pale yellow.  Eat soups and other clear broths.  Have a healthy diet.  Rest as needed.  Go back to work when your fever is gone or your doctor says it is okay. ? You may need to stay home longer to avoid giving your URI to others. ? You can also wear a face mask and wash your hands often to prevent spread of the virus.  Use your inhaler more if you have asthma.  Do not use any tobacco products, including cigarettes, chewing tobacco, or electronic cigarettes. If you need help quitting, ask your doctor. Contact a doctor if:  You are getting worse, not better.  Your symptoms are not helped by medicine.  You  have chills.  You are getting more short of breath.  You have brown or red mucus.  You have yellow or brown discharge from your nose.  You have pain in your face, especially when you bend forward.  You have a fever.  You have puffy (swollen) neck glands.  You have pain while swallowing.  You have white areas in the back of your throat. Get help right away if:  You have very bad or constant: ? Headache. ? Ear pain. ? Pain in your forehead, behind your eyes, and over your cheekbones (sinus pain). ? Chest pain.  You have long-lasting (chronic) lung disease and any of the following: ? Wheezing. ? Long-lasting cough. ? Coughing up  blood. ? A change in your usual mucus.  You have a stiff neck.  You have changes in your: ? Vision. ? Hearing. ? Thinking. ? Mood. This information is not intended to replace advice given to you by your health care provider. Make sure you discuss any questions you have with your health care provider. Document Released: 04/22/2008 Document Revised: 07/07/2016 Document Reviewed: 02/09/2014 Elsevier Interactive Patient Education  2018 [employer].

## 2017-12-16 ENCOUNTER — Other Ambulatory Visit: Payer: Self-pay | Admitting: Family Medicine

## 2017-12-18 ENCOUNTER — Ambulatory Visit: Payer: BLUE CROSS/BLUE SHIELD | Admitting: Family Medicine

## 2017-12-23 ENCOUNTER — Telehealth: Payer: Self-pay | Admitting: Cardiology

## 2018-01-12 ENCOUNTER — Ambulatory Visit: Payer: Self-pay | Admitting: Registered Nurse

## 2018-01-12 ENCOUNTER — Encounter: Payer: Self-pay | Admitting: Medical

## 2018-01-12 VITALS — BP 140/83 | HR 80 | Temp 98.3°F | Resp 18 | Ht 68.0 in | Wt 215.0 lb

## 2018-01-12 DIAGNOSIS — J4521 Mild intermittent asthma with (acute) exacerbation: Secondary | ICD-10-CM

## 2018-01-12 DIAGNOSIS — R69 Illness, unspecified: Secondary | ICD-10-CM

## 2018-01-12 DIAGNOSIS — J111 Influenza due to unidentified influenza virus with other respiratory manifestations: Secondary | ICD-10-CM

## 2018-01-12 DIAGNOSIS — J0101 Acute recurrent maxillary sinusitis: Secondary | ICD-10-CM

## 2018-01-12 MED ORDER — AZITHROMYCIN 250 MG PO TABS: ORAL_TABLET | ORAL | 0 refills | Status: DC

## 2018-01-12 MED ORDER — ACETAMINOPHEN 500 MG PO TABS: 1000.0000 mg | ORAL_TABLET | Freq: Four times a day (QID) | ORAL | 0 refills | Status: AC | PRN

## 2018-01-12 MED ORDER — BENZONATATE 200 MG PO CAPS: 200.0000 mg | ORAL_CAPSULE | Freq: Three times a day (TID) | ORAL | 0 refills | Status: AC | PRN

## 2018-01-12 MED ORDER — OSELTAMIVIR PHOSPHATE 75 MG PO CAPS: 75.0000 mg | ORAL_CAPSULE | Freq: Two times a day (BID) | ORAL | 0 refills | Status: AC

## 2018-01-12 NOTE — Progress Notes (Signed)
Subjective:    Patient ID: Sean Bowers, male    DOB: [birth year], 62 y.o.   MRN: [id number]  62y/o caucasian male established patient here for rhinitis, post nasal drip, cough, slightly loose stools/stomach upset this weekend and body/joint aches mild. Needs refill on tessalon pearles they do help with cough using albuterol inhaler twice a day  Daughter meeting with bone marrow transplant team  Soon as new diagnosis leukemia rare and currently undergoing chemotherapy.  Daughter with open leg sores and living with patient.  Patient has support through church and members cooking meals for family hasn't used E[employer] program but encouraged by PA Ratcliffe to do as needed.  His other daughter is a Marine scientist and helps with his sick daughter wound care.  + increased stress/worry  Works at [employer]  Constitutional: Negative for activity change, appetite change, chills, diaphoresis, fatigue, fever and unexpected weight change.  HENT: Positive for congestion, postnasal drip and rhinorrhea. Negative for dental problem, drooling, ear discharge, ear pain, facial swelling, hearing loss, mouth sores, nosebleeds, sinus pressure, sinus pain, sneezing, sore throat, tinnitus, trouble swallowing and voice change.   Eyes: Negative for photophobia, pain, discharge, redness, itching and visual disturbance.  Respiratory: Positive for cough, chest tightness and shortness of breath. Negative for choking, wheezing and stridor.   Cardiovascular: Negative for chest pain, palpitations and leg swelling.  Gastrointestinal: Positive for diarrhea. Negative for abdominal distention, abdominal pain, blood in stool, constipation, nausea and vomiting.  Endocrine: Negative for cold intolerance and heat intolerance.  Genitourinary: Negative for dysuria.  Musculoskeletal: Positive for myalgias. Negative for arthralgias, back pain, gait problem, joint swelling, neck pain and neck stiffness.  Skin:  Negative for color change, pallor, rash and wound.  Allergic/Immunologic: Positive for environmental allergies. Negative for food allergies and immunocompromised state.  Neurological: Negative for dizziness, tremors, seizures, syncope, facial asymmetry, speech difficulty, weakness, light-headedness, numbness and headaches.  Hematological: Negative for adenopathy. Does not bruise/bleed easily.  Psychiatric/Behavioral: Negative for agitation, behavioral problems, confusion and sleep disturbance.       Objective:   Physical Exam  Constitutional: He is oriented to person, place, and time. Vital signs are normal. He appears well-developed and well-nourished. He is active and cooperative.  Non-toxic appearance. He does not have a sickly appearance. He appears ill. No distress.  HENT:  Head: Normocephalic and atraumatic.  Right Ear: Hearing, external ear and ear canal normal. A middle ear effusion is present.  Left Ear: Hearing, external ear and ear canal normal. A middle ear effusion is present.  Nose: Mucosal edema and rhinorrhea present. No nose lacerations, sinus tenderness, nasal deformity, septal deviation or nasal septal hematoma. No epistaxis.  No foreign bodies. Right sinus exhibits maxillary sinus tenderness. Right sinus exhibits no frontal sinus tenderness. Left sinus exhibits maxillary sinus tenderness. Left sinus exhibits no frontal sinus tenderness.  Mouth/Throat: Uvula is midline and mucous membranes are normal. Mucous membranes are not pale, not dry and not cyanotic. He does not have dentures. No oral lesions. No trismus in the jaw. Normal dentition. No dental abscesses, uvula swelling, lacerations or dental caries. Posterior oropharyngeal edema and posterior oropharyngeal erythema present. No oropharyngeal exudate or tonsillar abscesses.  Cobblestoning posterior pharynx; bilateral nares wit scant yellow clear discharge; bilateral TMs air fluid level clear; bilateral allergic shiners   Eyes: Conjunctivae, EOM and lids are normal. Pupils are equal, round, and reactive to light. Right eye exhibits no chemosis, no discharge, no exudate and  no hordeolum. No foreign body present in the right eye. Left eye exhibits no chemosis, no discharge, no exudate and no hordeolum. No foreign body present in the left eye. Right conjunctiva is not injected. Right conjunctiva has no hemorrhage. Left conjunctiva is not injected. Left conjunctiva has no hemorrhage. No scleral icterus. Right eye exhibits normal extraocular motion and no nystagmus. Left eye exhibits normal extraocular motion and no nystagmus. Right pupil is round and reactive. Left pupil is round and reactive. Pupils are equal.  Neck: Trachea normal, normal range of motion and phonation normal. Neck supple. No tracheal tenderness and no muscular tenderness present. No neck rigidity. No tracheal deviation, no edema, no erythema and normal range of motion present. No thyroid mass and no thyromegaly present.  Cardiovascular: Normal rate, regular rhythm, S1 normal, S2 normal, normal heart sounds and intact distal pulses. PMI is not displaced. Exam reveals no gallop and no friction rub.  No murmur heard. Pulmonary/Chest: Effort normal and breath sounds normal. No accessory muscle usage or stridor. No respiratory distress. He has no decreased breath sounds. He has no wheezes. He has no rhonchi. He has no rales.  Rare nonproductive cough in exam room; spoke full sentences without difficulty  Abdominal: Soft. Normal appearance. He exhibits no distension, no fluid wave and no ascites. There is no rigidity and no guarding.  Musculoskeletal: Normal range of motion. He exhibits no edema or tenderness.       Right shoulder: Normal.       Left shoulder: Normal.       Right elbow: Normal.      Left elbow: Normal.       Right hip: Normal.       Left hip: Normal.       Right knee: Normal.       Left knee: Normal.       Cervical back: Normal.        Thoracic back: Normal.       Lumbar back: Normal.       Right hand: Normal.       Left hand: Normal.  Lymphadenopathy:       Head (right side): No submental, no submandibular, no tonsillar, no preauricular, no posterior auricular and no occipital adenopathy present.       Head (left side): No submental, no submandibular, no tonsillar, no preauricular, no posterior auricular and no occipital adenopathy present.    He has no cervical adenopathy.       Right cervical: No superficial cervical, no deep cervical and no posterior cervical adenopathy present.      Left cervical: No superficial cervical, no deep cervical and no posterior cervical adenopathy present.  Neurological: He is alert and oriented to person, place, and time. He has normal strength. He is not disoriented. He displays no atrophy and no tremor. No cranial nerve deficit or sensory deficit. He exhibits normal muscle tone. He displays no seizure activity. Coordination and gait normal. GCS eye subscore is 4. GCS verbal subscore is 5. GCS motor subscore is 6.  On/off exam table without difficulty; gait sure and steady in hallway  Skin: Skin is warm, dry and intact. No abrasion, no bruising, no burn, no ecchymosis, no laceration, no lesion, no petechiae and no rash noted. He is not diaphoretic. No cyanosis or erythema. No pallor. Nails show no clubbing.  Psychiatric: His speech is normal and behavior is normal. Judgment and thought content normal. His affect is labile. His affect is not angry, not blunt  and not inappropriate. Cognition and memory are normal. He does not exhibit a depressed mood.  Teary when talking about his daughter with leukemia and worrying about bringing illness home into the house  Nursing note and vitals reviewed.         Assessment & Plan:  A-influenza like illness, acute maxillary sinusitis, acute asthma exacerbation  P- Electronic Rx tamiflu 75mg  po BID x 5 days #10 RF0 and albuterol 82mcg 1-2 puffs po q4-6h  prn protracted cough, wheeze, chest tightness #1 RF0 Hydrate hydrate hydrate.if unable to maintain po intake or urine orange/brown/unable to void every 8 hours follow up with PCM/UCC/ER for re-evaluation.Tylenol 1000mg  po q6h prn pain/fever. cough lozenges po q2h prn cough.  Honey with lemon. Flu common in community immunosuppressed daughter with leukemia living with him. Use lysol/chlorox spray on counters/door handles/cabinet handles/faucets to prevent spread of viral illness.  Given 3 masks from clinic stock to use at home.  Frequent hand washing.    Suspect Viral illness: no evidence of invasive bacterial infection, non toxic and well hydrated.  This is most likely self limiting viral infection.  I do not see where any further testing or imaging is necessary at this time.   I will suggest supportive care, rest, good hygiene and encourage the patient to take adequate fluids.  Does not require work excuse. Patient to talk with supervisor discussed recommend staying home until afebrile 24 hours off tylenol. nasal saline 1-2 sprays each nostril prn q2h,.  Discussed honey with lemon and salt water gargles for comfort also.  The patient is to return to clinic or EMERGENCY ROOM if symptoms worsen or change significantly e.g. Dyspnea, dysphagia, vomiting, lethargy, SOB, wheezing. Patient verbalized agreement and understanding of treatment plan and had no further questions at this time.-  Restart flonase 1 spray each nostril BID #1 RF6, saline 2 sprays each nostril q2h wa prn congestion.  If no improvement with 48 hours of saline and flonase use start azithromycin 500mg  po day 1 then 250mg  po days 2-5 #6 RF0.  Tessalon pearles 200mg  po TID prn cough #60 RF0  Electronic Rx given.  Denied personal or family history of ENT cancer.  Shower BID especially prior to bed. No evidence of systemic bacterial infection, non toxic and well hydrated.  I do not see where any further testing or imaging is necessary at this time.   I  will suggest supportive care, rest, good hygiene and encourage the patient to take adequate fluids.  The patient is to return to clinic or EMERGENCY ROOM if symptoms worsen or change significantly.  Exitcare handout on sinusitis and sinus rinse given to patient.  Patient verbalized agreement and understanding of treatment plan and had no further questions at this time.   P2:  Hand washing and cover cough  Rx Azithromycin 500mg  po day 1 then 250mg  po days 2-5 #6 RF0 Electronic Rx.  Tessalon pearles 200mg  po TID prn cough #60 Rf0 Cough lozenges po q2h prn cough given 8 UD from clinic stock.  Refused Prednisone taper 10mg  (60/50/40/30/20/10mg ) po daily with breakfast.  Albuterol MDI 110mcg 1-2 puffs po q4-6h prn protracted cough/wheeze #1 RF0 side effect increased heart rate. Bronchitis simple, community acquired, may have started as viral (probably respiratory syncytial, parainfluenza, influenza, or adenovirus), but now evidence of acute purulent bronchitis with resultant bronchial edema and mucus formation.  Viruses are the most common cause of bronchial inflammation in otherwise healthy adults with acute bronchitis.  The appearance of sputum is not  predictive of whether a bacterial infection is present.  Purulent sputum is most often caused by viral infections.  There are a small portion of those caused by non-viral agents being Mycoplama pneumonia.  Microscopic examination or C&S of sputum in the healthy adult with acute bronchitis is generally not helpful (usually negative or normal respiratory flora) other considerations being cough from upper respiratory tract infections, sinusitis or allergic syndromes (mild asthma or viral pneumonia).  Differential Diagnoses:  reactive airway disease (asthma, allergic aspergillosis (eosinophilia), chronic bronchitis, respiratory infection (sinusitis, common cold, pneumonia), congestive heart failure, reflux esophagitis, bronchogenic tumor, aspiration syndromes and/or  exposure to pulmonary irritants/smoke.Without high fever, severe dyspnea, lack of physical findings or other risk factors, I will hold on a chest radiograph and CBC at this time.  I discussed that approximately 50% of patients with acute bronchitis have a cough that lasts up to three weeks, and 25% for over a month.  Tylenol 500mg  one to two tablets every four to six hours as needed for fever or myalgias.  No aspirin. Exitcare handout on how to prevent asthma exacerbations, bronchitis and inhaler use given to patient.  ER if hemopthysis, SOB, worst chest pain of life.   Patient instructed to follow up in if new or worsening symptoms despite plan of care  Patient verbalized agreement and understanding of treatment plan and had no further questions at this time  P2:  hand washing and cover cough

## 2018-01-12 NOTE — Patient Instructions (Addendum)
Influenza, Adult Influenza, more commonly known as "the flu," is a viral infection that primarily affects the respiratory tract. The respiratory tract includes organs that help you breathe, such as the lungs, nose, and throat. The flu causes many common cold symptoms, as well as a high fever and body aches. The flu spreads easily from person to person (is contagious). Getting a flu shot (influenza vaccination) every year is the best way to prevent influenza. What are the causes? Influenza is caused by a virus. You can catch the virus by:  Breathing in droplets from an infected person's cough or sneeze.  Touching something that was recently contaminated with the virus and then touching your mouth, nose, or eyes.  What increases the risk? The following factors may make you more likely to get the flu:  Not cleaning your hands frequently with soap and water or alcohol-based hand sanitizer.  Having close contact with many people during cold and flu season.  Touching your mouth, eyes, or nose without washing or sanitizing your hands first.  Not drinking enough fluids or not eating a healthy diet.  Not getting enough sleep or exercise.  Being under a high amount of stress.  Not getting a yearly (annual) flu shot.  You may be at a higher risk of complications from the flu, such as a severe lung infection (pneumonia), if you:  Are over the age of 65.  Are pregnant.  Have a weakened disease-fighting system (immune system). You may have a weakened immune system if you: ? Have HIV or AIDS. ? Are undergoing chemotherapy. ? Aretaking medicines that reduce the activity of (suppress) the immune system.  Have a long-term (chronic) illness, such as heart disease, kidney disease, diabetes, or lung disease.  Have a liver disorder.  Are obese.  Have anemia.  What are the signs or symptoms? Symptoms of this condition typically last 4-10 days and may  include:  Fever.  Chills.  Headache, body aches, or muscle aches.  Sore throat.  Cough.  Runny or congested nose.  Chest discomfort and cough.  Poor appetite.  Weakness or tiredness (fatigue).  Dizziness.  Nausea or vomiting.  How is this diagnosed? This condition may be diagnosed based on your medical history and a physical exam. Your health care provider may do a nose or throat swab test to confirm the diagnosis. How is this treated? If influenza is detected early, you can be treated with antiviral medicine that can reduce the length of your illness and the severity of your symptoms. This medicine may be given by mouth (orally) or through an IV tube that is inserted in one of your veins. The goal of treatment is to relieve symptoms by taking care of yourself at home. This may include taking over-the-counter medicines, drinking plenty of fluids, and adding humidity to the air in your home. In some cases, influenza goes away on its own. Severe influenza or complications from influenza may be treated in a hospital. Follow these instructions at home:  Take over-the-counter and prescription medicines only as told by your health care provider.  Use a cool mist humidifier to add humidity to the air in your home. This can make breathing easier.  Rest as needed.  Drink enough fluid to keep your urine clear or pale yellow.  Cover your mouth and nose when you cough or sneeze.  Wash your hands with soap and water often, especially after you cough or sneeze. If soap and water are not available, use hand sanitizer.    Stay home from work or school as told by your health care provider. Unless you are visiting your health care provider, try to avoid leaving home until your fever has been gone for 24 hours without the use of medicine.  Keep all follow-up visits as told by your health care provider. This is important. How is this prevented?  Getting an annual flu shot is the best way  to avoid getting the flu. You may get the flu shot in late summer, fall, or winter. Ask your health care provider when you should get your flu shot.  Wash your hands often or use hand sanitizer often.  Avoid contact with people who are sick during cold and flu season.  Eat a healthy diet, drink plenty of fluids, get enough sleep, and exercise regularly. Contact a health care provider if:  You develop new symptoms.  You have: ? Chest pain. ? Diarrhea. ? A fever.  Your cough gets worse.  You produce more mucus.  You feel nauseous or you vomit. Get help right away if:  You develop shortness of breath or difficulty breathing.  Your skin or nails turn a bluish color.  You have severe pain or stiffness in your neck.  You develop a sudden headache or sudden pain in your face or ear.  You cannot stop vomiting. This information is not intended to replace advice given to you by your health care provider. Make sure you discuss any questions you have with your health care provider. Document Released: 11/01/2000 Document Revised: 04/11/2016 Document Reviewed: 08/29/2015 Elsevier Interactive Patient Education  2017 [employer] Allergic Rhinitis, Adult Allergic rhinitis is an allergic reaction that affects the mucous membrane inside the nose. It causes sneezing, a runny or stuffy nose, and the feeling of mucus going down the back of the throat (postnasal drip). Allergic rhinitis can be mild to severe. There are two types of allergic rhinitis:  Seasonal. This type is also called hay fever. It happens only during certain seasons.  Perennial. This type can happen at any time of the year.  What are the causes? This condition happens when the body's defense system (immune system) responds to certain harmless substances called allergens as though they were germs.  Seasonal allergic rhinitis is triggered by pollen, which can come from grasses, trees, and weeds. Perennial allergic rhinitis  may be caused by:  House dust mites.  Pet dander.  Mold spores.  What are the signs or symptoms? Symptoms of this condition include:  Sneezing.  Runny or stuffy nose (nasal congestion).  Postnasal drip.  Itchy nose.  Tearing of the eyes.  Trouble sleeping.  Daytime sleepiness.  How is this diagnosed? This condition may be diagnosed based on:  Your medical history.  A physical exam.  Tests to check for related conditions, such as: ? Asthma. ? Pink eye. ? Ear infection. ? Upper respiratory infection.  Tests to find out which allergens trigger your symptoms. These may include skin or blood tests.  How is this treated? There is no cure for this condition, but treatment can help control symptoms. Treatment may include:  Taking medicines that block allergy symptoms, such as antihistamines. Medicine may be given as a shot, nasal spray, or pill.  Avoiding the allergen.  Desensitization. This treatment involves getting ongoing shots until your body becomes less sensitive to the allergen. This treatment may be done if other treatments do not help.  If taking medicine and avoiding the allergen does not work, new, stronger medicines may  be prescribed.  Follow these instructions at home:  Find out what you are allergic to. Common allergens include smoke, dust, and pollen.  Avoid the things you are allergic to. These are some things you can do to help avoid allergens: ? Replace carpet with wood, tile, or vinyl flooring. Carpet can trap dander and dust. ? Do not smoke. Do not allow smoking in your home. ? Change your heating and air conditioning filter at least once a month. ? During allergy season:  Keep windows closed as much as possible.  Plan outdoor activities when pollen counts are lowest. This is usually during the evening hours.  When coming indoors, change clothing and shower before sitting on furniture or bedding.  Take over-the-counter and prescription  medicines only as told by your health care provider.  Keep all follow-up visits as told by your health care provider. This is important. Contact a health care provider if:  You have a fever.  You develop a persistent cough.  You make whistling sounds when you breathe (you wheeze).  Your symptoms interfere with your normal daily activities. Get help right away if:  You have shortness of breath. Summary  This condition can be managed by taking medicines as directed and avoiding allergens.  Contact your health care provider if you develop a persistent cough or fever.  During allergy season, keep windows closed as much as possible. This information is not intended to replace advice given to you by your health care provider. Make sure you discuss any questions you have with your health care provider. Document Released: 07/30/2001 Document Revised: 12/12/2016 Document Reviewed: 12/12/2016 Elsevier Interactive Patient Education  2018 [employer]. Sinusitis, Adult Sinusitis is soreness and inflammation of your sinuses. Sinuses are hollow spaces in the bones around your face. Your sinuses are located:  Around your eyes.  In the middle of your forehead.  Behind your nose.  In your cheekbones.  Your sinuses and nasal passages are lined with a stringy fluid (mucus). Mucus normally drains out of your sinuses. When your nasal tissues become inflamed or swollen, the mucus can become trapped or blocked so air cannot flow through your sinuses. This allows bacteria, viruses, and funguses to grow, which leads to infection. Sinusitis can develop quickly and last for 7?10 days (acute) or for more than 12 weeks (chronic). Sinusitis often develops after a cold. What are the causes? This condition is caused by anything that creates swelling in the sinuses or stops mucus from draining, including:  Allergies.  Asthma.  Bacterial or viral infection.  Abnormally shaped bones between the nasal  passages.  Nasal growths that contain mucus (nasal polyps).  Narrow sinus openings.  Pollutants, such as chemicals or irritants in the air.  A foreign object stuck in the nose.  A fungal infection. This is rare.  What increases the risk? The following factors may make you more likely to develop this condition:  Having allergies or asthma.  Having had a recent cold or respiratory tract infection.  Having structural deformities or blockages in your nose or sinuses.  Having a weak immune system.  Doing a lot of swimming or diving.  Overusing nasal sprays.  Smoking.  What are the signs or symptoms? The main symptoms of this condition are pain and a feeling of pressure around the affected sinuses. Other symptoms include:  Upper toothache.  Earache.  Headache.  Bad breath.  Decreased sense of smell and taste.  A cough that may get worse at night.  Fatigue.  Fever.  Thick drainage from your nose. The drainage is often green and it may contain pus (purulent).  Stuffy nose or congestion.  Postnasal drip. This is when extra mucus collects in the throat or back of the nose.  Swelling and warmth over the affected sinuses.  Sore throat.  Sensitivity to light.  How is this diagnosed? This condition is diagnosed based on symptoms, a medical history, and a physical exam. To find out if your condition is acute or chronic, your health care provider may:  Look in your nose for signs of nasal polyps.  Tap over the affected sinus to check for signs of infection.  View the inside of your sinuses using an imaging device that has a light attached (endoscope).  If your health care provider suspects that you have chronic sinusitis, you may also:  Be tested for allergies.  Have a sample of mucus taken from your nose (nasal culture) and checked for bacteria.  Have a mucus sample examined to see if your sinusitis is related to an allergy.  If your sinusitis does not  respond to treatment and it lasts longer than 8 weeks, you may have an MRI or CT scan to check your sinuses. These scans also help to determine how severe your infection is. In rare cases, a bone biopsy may be done to rule out more serious types of fungal sinus disease. How is this treated? Treatment for sinusitis depends on the cause and whether your condition is chronic or acute. If a virus is causing your sinusitis, your symptoms will go away on their own within 10 days. You may be given medicines to relieve your symptoms, including:  Topical nasal decongestants. They shrink swollen nasal passages and let mucus drain from your sinuses.  Antihistamines. These drugs block inflammation that is triggered by allergies. This can help to ease swelling in your nose and sinuses.  Topical nasal corticosteroids. These are nasal sprays that ease inflammation and swelling in your nose and sinuses.  Nasal saline washes. These rinses can help to get rid of thick mucus in your nose.  If your condition is caused by bacteria, you will be given an antibiotic medicine. If your condition is caused by a fungus, you will be given an antifungal medicine. Surgery may be needed to correct underlying conditions, such as narrow nasal passages. Surgery may also be needed to remove polyps. Follow these instructions at home: Medicines  Take, use, or apply over-the-counter and prescription medicines only as told by your health care provider. These may include nasal sprays.  If you were prescribed an antibiotic medicine, take it as told by your health care provider. Do not stop taking the antibiotic even if you start to feel better. Hydrate and Humidify  Drink enough water to keep your urine clear or pale yellow. Staying hydrated will help to thin your mucus.  Use a cool mist humidifier to keep the humidity level in your home above 50%.  Inhale steam for 10-15 minutes, 3-4 times a day or as told by your health care  provider. You can do this in the bathroom while a hot shower is running.  Limit your exposure to cool or dry air. Rest  Rest as much as possible.  Sleep with your head raised (elevated).  Make sure to get enough sleep each night. General instructions  Apply a warm, moist washcloth to your face 3-4 times a day or as told by your health care provider. This will help with  discomfort.  Wash your hands often with soap and water to reduce your exposure to viruses and other germs. If soap and water are not available, use hand sanitizer.  Do not smoke. Avoid being around people who are smoking (secondhand smoke).  Keep all follow-up visits as told by your health care provider. This is important. Contact a health care provider if:  You have a fever.  Your symptoms get worse.  Your symptoms do not improve within 10 days. Get help right away if:  You have a severe headache.  You have persistent vomiting.  You have pain or swelling around your face or eyes.  You have vision problems.  You develop confusion.  Your neck is stiff.  You have trouble breathing. This information is not intended to replace advice given to you by your health care provider. Make sure you discuss any questions you have with your health care provider. Document Released: 11/04/2005 Document Revised: 06/30/2016 Document Reviewed: 08/30/2015 Elsevier Interactive Patient Education  2018 [employer] Prevention, Adult Although you may not be able to control the fact that you have asthma, you can take actions to prevent episodes of asthma (asthma attacks). These actions include:  Creating a written plan for managing and treating your asthma attacks (asthma action plan).  Monitoring your asthma.  Avoiding things that can irritate your airways or make your asthma symptoms worse (asthma triggers).  Taking your medicines as directed.  Acting quickly if you have signs or symptoms of an asthma  attack.  What are some ways to prevent an asthma attack? Create a plan Work with your health care provider to create an asthma action plan. This plan should include:  A list of your asthma triggers and how to avoid them.  A list of symptoms that you experience during an asthma attack.  Information about when to take medicine and how much medicine to take.  Information to help you understand your peak flow measurements.  Contact information for your health care providers.  Daily actions that you can take to control asthma.  Monitor your asthma  To monitor your asthma:  Use your peak flow meter every morning and every evening for 2-3 weeks. Record the results in a journal. A drop in your peak flow numbers on one or more days may mean that you are starting to have an asthma attack, even if you are not having symptoms.  When you have asthma symptoms, write them down in a journal.  Avoid asthma triggers  Work with your health care provider to find out what your asthma triggers are. This can be done by:  Being tested for allergies.  Keeping a journal that notes when asthma attacks occur and what may have contributed to them.  Asking your health care provider whether other medical conditions make your asthma worse.  Common asthma triggers include:  Dust.  Smoke. This includes campfire smoke and secondhand smoke from tobacco products.  Pet dander.  Trees, grasses or pollens.  Very cold, dry, or humid air.  Mold.  Foods that contain high amounts of sulfites.  Strong smells.  Engine exhaust and air pollution.  Aerosol sprays and fumes from household cleaners.  Household pests and their droppings, including dust mites and cockroaches.  Certain medicines, including NSAIDs.  Once you have determined your asthma triggers, take steps to avoid them. Depending on your triggers, you may be able to reduce the chance of an asthma attack by:  Keeping your home clean. Have  someone dust and vacuum your home for you 1 or 2 times a week. If possible, have them use a high-efficiency particulate arrestance (HEPA) vacuum.  Washing your sheets weekly in hot water.  Using allergy-proof mattress covers and casings on your bed.  Keeping pets out of your home.  Taking care of mold and water problems in your home.  Avoiding areas where people smoke.  Avoiding using strong perfumes or odor sprays.  Avoid spending a lot of time outdoors when pollen counts are high and on very windy days.  Talking with your health care provider before stopping or starting any new medicines.  Medicines Take over-the-counter and prescription medicines only as told by your health care provider. Many asthma attacks can be prevented by carefully following your medicine schedule. Taking your medicines correctly is especially important when you cannot avoid certain asthma triggers. Even if you are doing well, do not stop taking your medicine and do not take less medicine. Act quickly If an asthma attack happens, acting quickly can decrease how severe it is and how long it lasts. Take these actions:  Pay attention to your symptoms. If you are coughing, wheezing, or having difficulty breathing, do not wait to see if your symptoms go away on their own. Follow your asthma action plan.  If you have followed your asthma action plan and your symptoms are not improving, call your health care provider or seek immediate medical care at the nearest hospital.  It is important to write down how often you need to use your fast-acting rescue inhaler. You can track how often you use an inhaler in your journal. If you are using your rescue inhaler more often, it may mean that your asthma is not under control. Adjusting your asthma treatment plan may help you to prevent future asthma attacks and help you to gain better control of your condition. How can I prevent an asthma attack when I exercise?  Exercise is a  common asthma trigger. To prevent asthma attacks during exercise:  Follow advice from your health care provider about whether you should use your fast-acting inhaler before exercising. Many people with asthma experience exercise-induced bronchoconstriction (EIB). This condition often worsens during vigorous exercise in cold, humid, or dry environments. Usually, people with EIB can stay very active by using a fast-acting inhaler before exercising.  Avoid exercising outdoors in very cold or humid weather.  Avoid exercising outdoors when pollen counts are high.  Warm up and cool down when exercising.  Stop exercising right away if asthma symptoms start.  Consider taking part in exercises that are less likely to cause asthma symptoms such as:  Indoor swimming.  Biking.  Walking.  Hiking.  Playing football.  This information is not intended to replace advice given to you by your health care provider. Make sure you discuss any questions you have with your health care provider. Document Released: 10/23/2009 Document Revised: 07/05/2016 Document Reviewed: 04/20/2016 Elsevier Interactive Patient Education  2018 [employer]. How to Use a Metered Dose Inhaler A metered dose inhaler is a handheld device for taking medicine that must be breathed into the lungs (inhaled). The device can be used to deliver a variety of inhaled medicines, including:  Quick relief or rescue medicines, such as bronchodilators.  Controller medicines, such as corticosteroids.  The medicine is delivered by pushing down on a metal canister to release a preset amount of spray and medicine. Each device contains the amount of medicine that is needed for a preset number  of uses (inhalations). Your health care provider may recommend that you use a spacer with your inhaler to help you take the medicine more effectively. A spacer is a plastic tube with a mouthpiece on one end and an opening that connects to the inhaler on  the other end. A spacer holds the medicine in a tube for a short time, which allows you to inhale more medicine. What are the risks? If you do not use your inhaler correctly, medicine might not reach your lungs to help you breathe. Inhaler medicine can cause side effects, such as:  Mouth or throat infection.  Cough.  Hoarseness.  Headache.  Nausea and vomiting.  Lung infection (pneumonia) in people who have a lung condition called COPD.  How to use a metered dose inhaler without a spacer 1. Remove the cap from the inhaler. 2. If you are using the inhaler for the first time, shake it for 5 seconds, turn it away from your face, then release 4 puffs into the air. This is called priming. 3. Shake the inhaler for 5 seconds. 4. Position the inhaler so the top of the canister faces up. 5. Put your index finger on the top of the medicine canister. Support the bottom of the inhaler with your thumb. 6. Breathe out normally and as completely as possible, away from the inhaler. 7. Either place the inhaler between your teeth and close your lips tightly around the mouthpiece, or hold the inhaler 1-2 inches (2.5-5 cm) away from your open mouth. Keep your tongue down out of the way. If you are unsure which technique to use, ask your health care provider. 8. Press the canister down with your index finger to release the medicine, then inhale deeply and slowly through your mouth (not your nose) until your lungs are completely filled. Inhaling should take 4-6 seconds. 9. Hold the medicine in your lungs for 5-10 seconds (10 seconds is best). This helps the medicine get into the small airways of your lungs. 10. With your lips in a tight circle (pursed), breathe out slowly. 11. Repeat steps 3-10 until you have taken the number of puffs that your health care provider directed. Wait about 1 minute between puffs or as directed. 12. Put the cap on the inhaler. 13. If you are using a steroid inhaler, rinse your  mouth with water, gargle, and spit out the water. Do not swallow the water. How to use a metered dose inhaler with a spacer 1. Remove the cap from the inhaler. 2. If you are using the inhaler for the first time, shake it for 5 seconds, turn it away from your face, then release 4 puffs into the air. This is called priming. 3. Shake the inhaler for 5 seconds. 4. Place the open end of the spacer onto the inhaler mouthpiece. 5. Position the inhaler so the top of the canister faces up and the spacer mouthpiece faces you. 6. Put your index finger on the top of the medicine canister. Support the bottom of the inhaler and the spacer with your thumb. 7. Breathe out normally and as completely as possible, away from the spacer. 8. Place the spacer between your teeth and close your lips tightly around it. Keep your tongue down out of the way. 9. Press the canister down with your index finger to release the medicine, then inhale deeply and slowly through your mouth (not your nose) until your lungs are completely filled. Inhaling should take 4-6 seconds. 10. Hold the medicine in your  lungs for 5-10 seconds (10 seconds is best). This helps the medicine get into the small airways of your lungs. 11. With your lips in a tight circle (pursed), breathe out slowly. 12. Repeat steps 3-11 until you have taken the number of puffs that your health care provider directed. Wait about 1 minute between puffs or as directed. 13. Remove the spacer from the inhaler and put the cap on the inhaler. 14. If you are using a steroid inhaler, rinse your mouth with water, gargle, and spit out the water. Do not swallow the water. Follow these instructions at home:  Take your inhaled medicine only as told by your health care provider. Do not use the inhaler more than directed by your health care provider.  Keep all follow-up visits as told by your health care provider. This is important.  If your inhaler has a counter, you can check it  to determine how full your inhaler is. If your inhaler does not have a counter, ask your health care provider when you will need to refill your inhaler and write the refill date on a calendar or on your inhaler canister. Note that you cannot know when an inhaler is empty by shaking it.  Follow directions on the package insert for care and cleaning of your inhaler and spacer. Contact a health care provider if:  Symptoms are only partially relieved with your inhaler.  You are having trouble using your inhaler.  You have an increase in phlegm.  You have headaches. Get help right away if:  You feel little or no relief after using your inhaler.  You have dizziness.  You have a fast heart rate.  You have chills or a fever.  You have night sweats.  There is blood in your phlegm. Summary  A metered dose inhaler is a handheld device for taking medicine that must be breathed into the lungs (inhaled).  The medicine is delivered by pushing down on a metal canister to release a preset amount of spray and medicine.  Each device contains the amount of medicine that is needed for a preset number of uses (inhalations). This information is not intended to replace advice given to you by your health care provider. Make sure you discuss any questions you have with your health care provider. Document Released: 11/04/2005 Document Revised: 09/24/2016 Document Reviewed: 09/24/2016 Elsevier Interactive Patient Education  2017 [employer] Acute Bronchitis, Adult Acute bronchitis is sudden (acute) swelling of the air tubes (bronchi) in the lungs. Acute bronchitis causes these tubes to fill with mucus, which can make it hard to breathe. It can also cause coughing or wheezing. In adults, acute bronchitis usually goes away within 2 weeks. A cough caused by bronchitis may last up to 3 weeks. Smoking, allergies, and asthma can make the condition worse. Repeated episodes of bronchitis may cause further lung  problems, such as chronic obstructive pulmonary disease (COPD). What are the causes? This condition can be caused by germs and by substances that irritate the lungs, including:  Cold and flu viruses. This condition is most often caused by the same virus that causes a cold.  Bacteria.  Exposure to tobacco smoke, dust, fumes, and air pollution.  What increases the risk? This condition is more likely to develop in people who:  Have close contact with someone with acute bronchitis.  Are exposed to lung irritants, such as tobacco smoke, dust, fumes, and vapors.  Have a weak immune system.  Have a respiratory condition such as asthma.  What are the signs or symptoms? Symptoms of this condition include:  A cough.  Coughing up clear, yellow, or green mucus.  Wheezing.  Chest congestion.  Shortness of breath.  A fever.  Body aches.  Chills.  A sore throat.  How is this diagnosed? This condition is usually diagnosed with a physical exam. During the exam, your health care provider may order tests, such as chest X-rays, to rule out other conditions. He or she may also:  Test a sample of your mucus for bacterial infection.  Check the level of oxygen in your blood. This is done to check for pneumonia.  Do a chest X-ray or lung function testing to rule out pneumonia and other conditions.  Perform blood tests.  Your health care provider will also ask about your symptoms and medical history. How is this treated? Most cases of acute bronchitis clear up over time without treatment. Your health care provider may recommend:  Drinking more fluids. Drinking more makes your mucus thinner, which may make it easier to breathe.  Taking a medicine for a fever or cough.  Taking an antibiotic medicine.  Using an inhaler to help improve shortness of breath and to control a cough.  Using a cool mist vaporizer or humidifier to make it easier to breathe.  Follow these instructions at  home: Medicines  Take over-the-counter and prescription medicines only as told by your health care provider.  If you were prescribed an antibiotic, take it as told by your health care provider. Do not stop taking the antibiotic even if you start to feel better. General instructions  Get plenty of rest.  Drink enough fluids to keep your urine clear or pale yellow.  Avoid smoking and secondhand smoke. Exposure to cigarette smoke or irritating chemicals will make bronchitis worse. If you smoke and you need help quitting, ask your health care provider. Quitting smoking will help your lungs heal faster.  Use an inhaler, cool mist vaporizer, or humidifier as told by your health care provider.  Keep all follow-up visits as told by your health care provider. This is important. How is this prevented? To lower your risk of getting this condition again:  Wash your hands often with soap and water. If soap and water are not available, use hand sanitizer.  Avoid contact with people who have cold symptoms.  Try not to touch your hands to your mouth, nose, or eyes.  Make sure to get the flu shot every year.  Contact a health care provider if:  Your symptoms do not improve in 2 weeks of treatment. Get help right away if:  You cough up blood.  You have chest pain.  You have severe shortness of breath.  You become dehydrated.  You faint or keep feeling like you are going to faint.  You keep vomiting.  You have a severe headache.  Your fever or chills gets worse. This information is not intended to replace advice given to you by your health care provider. Make sure you discuss any questions you have with your health care provider. Document Released: 12/12/2004 Document Revised: 05/29/2016 Document Reviewed: 04/24/2016 Elsevier Interactive Patient Education  2018 [employer]. Sinus Rinse What is a sinus rinse? A sinus rinse is a simple home treatment that is used to rinse your  sinuses with a sterile mixture of salt and water (saline solution). Sinuses are air-filled spaces in your skull behind the bones of your face and forehead that open into your nasal cavity. You will  use the following:  Saline solution.  Neti pot or spray bottle. This releases the saline solution into your nose and through your sinuses. Neti pots and spray bottles can be purchased at [employer], a health food store, or online.  When would I do a sinus rinse? A sinus rinse can help to clear mucus, dirt, dust, or pollen from the nasal cavity. You may do a sinus rinse when you have a cold, a virus, nasal allergy symptoms, a sinus infection, or stuffiness in the nose or sinuses. If you are considering a sinus rinse:  Ask your child's health care provider before performing a sinus rinse on your child.  Do not do a sinus rinse if you have had ear or nasal surgery, ear infection, or blocked ears.  How do I do a sinus rinse?  Wash your hands.  Disinfect your device according to the directions provided and then dry it.  Use the solution that comes with your device or one that is sold separately in stores. Follow the mixing directions on the package.  Fill your device with the amount of saline solution as directed by the device instructions.  Stand over a sink and tilt your head sideways over the sink.  Place the spout of the device in your upper nostril (the one closer to the ceiling).  Gently pour or squeeze the saline solution into the nasal cavity. The liquid should drain to the lower nostril if you are not overly congested.  Gently blow your nose. Blowing too hard may cause ear pain.  Repeat in the other nostril.  Clean and rinse your device with clean water and then air-dry it. Are there risks of a sinus rinse? Sinus rinse is generally very safe and effective. However, there are a few risks, which include:  A burning sensation in the sinuses. This may happen if you do not  make the saline solution as directed. Make sure to follow all directions when making the saline solution.  Infection from contaminated water. This is rare, but possible.  Nasal irritation.  This information is not intended to replace advice given to you by your health care provider. Make sure you discuss any questions you have with your health care provider. Document Released: 06/01/2014 Document Revised: 10/01/2016 Document Reviewed: 03/22/2014 Elsevier Interactive Patient Education  2017 [employer].

## 2018-01-14 NOTE — Telephone Encounter (Signed)
Close Encounter 

## 2018-03-19 ENCOUNTER — Other Ambulatory Visit: Payer: Self-pay | Admitting: Family Medicine

## 2018-04-02 ENCOUNTER — Encounter: Payer: Self-pay | Admitting: Family Medicine

## 2018-04-02 ENCOUNTER — Ambulatory Visit (INDEPENDENT_AMBULATORY_CARE_PROVIDER_SITE_OTHER): Payer: BLUE CROSS/BLUE SHIELD | Admitting: Family Medicine

## 2018-04-02 VITALS — BP 122/80 | HR 66 | Temp 98.3°F | Ht 68.5 in | Wt 222.5 lb

## 2018-04-02 DIAGNOSIS — Z0000 Encounter for general adult medical examination without abnormal findings: Secondary | ICD-10-CM

## 2018-04-02 DIAGNOSIS — R5383 Other fatigue: Secondary | ICD-10-CM | POA: Diagnosis not present

## 2018-04-02 DIAGNOSIS — Z125 Encounter for screening for malignant neoplasm of prostate: Secondary | ICD-10-CM

## 2018-04-02 LAB — COMPREHENSIVE METABOLIC PANEL
ALT: 29 U/L (ref 0–53)
AST: 23 U/L (ref 0–37)
Albumin: 4.3 g/dL (ref 3.5–5.2)
Alkaline Phosphatase: 35 U/L — ABNORMAL LOW (ref 39–117)
BUN: 17 mg/dL (ref 6–23)
CALCIUM: 9.7 mg/dL (ref 8.4–10.5)
CHLORIDE: 101 meq/L (ref 96–112)
CO2: 31 meq/L (ref 19–32)
Creatinine, Ser: 1.04 mg/dL (ref 0.40–1.50)
GFR: 77.17 mL/min (ref >60.00)
GLUCOSE: 93 mg/dL (ref 70–99)
Potassium: 4.4 mEq/L (ref 3.5–5.1)
Sodium: 141 mEq/L (ref 135–145)
Total Bilirubin: 0.9 mg/dL (ref 0.2–1.2)
Total Protein: 6.9 g/dL (ref 6.0–8.3)

## 2018-04-02 LAB — LIPID PANEL
Cholesterol: 150 mg/dL (ref 0–200)
HDL: 45.7 mg/dL (ref >39.00)
LDL CALC: 86 mg/dL (ref 0–99)
NonHDL: 104.65
TRIGLYCERIDES: 91 mg/dL (ref 0.0–149.0)
Total CHOL/HDL Ratio: 3
VLDL: 18.2 mg/dL (ref 0.0–40.0)

## 2018-04-02 LAB — CBC
HCT: 45.1 % (ref 39.0–52.0)
Hemoglobin: 15.7 g/dL (ref 13.0–17.0)
MCHC: 34.8 g/dL (ref 30.0–36.0)
MCV: 86.7 fl (ref 78.0–100.0)
Platelets: 198 10*3/uL (ref 150.0–400.0)
RBC: 5.2 Mil/uL (ref 4.22–5.81)
RDW: 13.3 % (ref 11.5–15.5)
WBC: 6.3 10*3/uL (ref 4.0–10.5)

## 2018-04-02 LAB — PSA: PSA: 4.25 ng/mL — AB (ref 0.10–4.00)

## 2018-04-02 NOTE — Progress Notes (Signed)
Chief Complaint  Patient presents with  . Annual Exam    Well Male Reginaldo Hazard is here for a complete physical.   His last physical was >1 year ago.  Current diet: in general, a "healthy" diet.  Current exercise: Tries to walk 3-4 times per week Weight trend: increasing again Daytime fatigue? Yes- in the afternoon Seat belt? Yes.    Health maintenance Shingrix- No Colonoscopy- Yes Tetanus- Yes HIV- Yes Hep C- Yes Prostate cancer screening- Yes   Past Medical History:  Diagnosis Date  . Allergy   . Arthritis   . Asthma    no inhalers  . Diverticulitis 09/2016   seen in Urgent Care  . Genital warts   . History of chicken pox   . History of hay fever   . Hyperlipidemia 09/27/2016  . Hypertension       Past Surgical History:  Procedure Laterality Date  . CARPAL TUNNEL RELEASE Bilateral 2006-2007    Medications  Current Outpatient Medications on File Prior to Visit  Medication Sig Dispense Refill  . Albuterol Sulfate 108 (90 Base) MCG/ACT AEPB Inhale 1-2 puffs into the lungs every 6 (six) hours as needed (Shortness of breath or wheezing). 1 each 1  . aspirin 81 MG tablet Take 81 mg by mouth daily.    . chlorhexidine (PERIDEX) 0.12 % solution 5 mLs by Mouth Rinse route 2 (two) times daily.  0  . ibuprofen (ADVIL,MOTRIN) 600 MG tablet Take 600 mg by mouth 2 (two) times daily as needed.  0  . lisinopril-hydrochlorothiazide (PRINZIDE,ZESTORETIC) 20-25 MG tablet TAKE 1 TABLET BY MOUTH ONCE DAILY 90 tablet 1  . loratadine (CLARITIN) 10 MG tablet Take 10 mg by mouth daily.    . Multiple Vitamins-Minerals (MULTIVITAMIN MEN PO) Take by mouth.    . nitroGLYCERIN (NITROSTAT) 0.4 MG SL tablet Place 1 tablet (0.4 mg total) under the tongue every 5 (five) minutes x 3 doses as needed for chest pain. 25 tablet 1  . rosuvastatin (CRESTOR) 40 MG tablet Take 1 tablet (40 mg total) by mouth daily at 6 PM. 30 tablet 3   Allergies Allergies  Allergen Reactions  . Penicillins  Swelling    Swelling of lips Has patient had a PCN reaction causing immediate rash, facial/tongue/throat swelling, SOB or lightheadedness with hypotension: Yes Has patient had a PCN reaction causing severe rash involving mucus membranes or skin necrosis: Yes Has patient had a PCN reaction that required hospitalization: No Has patient had a PCN reaction occurring within the last 10 years: No If all of the above answers are "NO", then may proceed with Cephalosporin use.     Family History Family History  Problem Relation Age of Onset  . Diabetes Mother   . Heart disease Mother   . Hypertension Mother   . Breast cancer Mother   . Arthritis Mother   . Cancer Mother        breast  . Hyperlipidemia Mother   . Heart disease Father   . Stroke Father   . Arthritis Father   . Cancer Father        prostate  . Hyperlipidemia Father   . Heart disease Paternal Grandmother   . Cancer Maternal Uncle        lung  . Cancer Paternal Aunt        lung  . Cancer Paternal Uncle        lung    Review of Systems: Constitutional:  no fevers Eye:  no recent  significant change in vision Ear/Nose/Mouth/Throat:  Ears:  no hearing loss Nose/Mouth/Throat:  no complaints of nasal congestion, no sore throat Cardiovascular:  no chest pain, no palpitations Respiratory:  no cough and no shortness of breath Gastrointestinal:  no abdominal pain, no change in bowel habits GU:  Male: negative for dysuria, frequency, and incontinence and negative for prostate symptoms Musculoskeletal/Extremities: +b/l hand pain; otherwise no pain, redness, or swelling of the joints Integumentary (Skin/Breast):  no abnormal skin lesions reported Neurologic:  no headaches Endocrine: No unexpected weight changes Hematologic/Lymphatic:  no abnormal bleeding  Exam BP 122/80 (BP Location: Left Arm, Patient Position: Sitting, Cuff Size: Large)   Pulse 66   Temp 98.3 F (36.8 C) (Oral)   Ht 5' 8.5" (1.74 m)   Wt 222 lb 8 oz  (100.9 kg)   SpO2 97%   BMI 33.34 kg/m  General:  well developed, well nourished, in no apparent distress Skin: Small verrucae on R thumb. See below for unchanged area of R trap area; otherwise no significant moles, warts, or growths Head:  no masses, lesions, or tenderness Eyes:  pupils equal and round, sclera anicteric without injection Ears:  canals without lesions, TMs shiny without retraction, no obvious effusion, no erythema Nose:  nares patent, septum midline, mucosa normal Throat/Pharynx:  lips and gingiva without lesion; tongue and uvula midline; non-inflamed pharynx; no exudates or postnasal drainage Neck: neck supple without adenopathy, thyromegaly, or masses Lungs:  clear to auscultation, breath sounds equal bilaterally, no respiratory distress Cardio:  regular rate and rhythm, 2+ pitting bl LE edema, no bruits Abdomen:  abdomen soft, nontender; bowel sounds normal; no masses or organomegaly Genital (male): circumcised penis, no lesions or discharge; testes present bilaterally without masses or tenderness Rectal: Deferred Musculoskeletal:  symmetrical muscle groups noted without atrophy or deformity Extremities:  no clubbing, cyanosis, or edema, no deformities, no skin discoloration Neuro:  gait normal; deep tendon reflexes normal and symmetric Psych: well oriented with normal range of affect and appropriate judgment/insight   Assessment and Plan  Well adult exam - Plan: Comprehensive metabolic panel, Lipid panel  Other fatigue - Plan: CBC  Prostate cancer screening - Plan: PSA   Well 62 y.o. male. Counseled on diet and exercise. He knows he needs to get better. Counseled on risks and benefits of prostate cancer screening with PSA. The patient agrees to undergo testing. Immunizations, labs, and further orders as above. Follow up in 6 mo for cholesterol/htn check or prn. The patient voiced understanding and agreement to the plan.  [place],  DO 04/02/18 7:36 AM

## 2018-04-02 NOTE — Patient Instructions (Addendum)
Call your pharmacy to see about the availability of the new shingles vaccine (Shingrix).   Treat the area on the thumb as a wart. Consider duct tape or Compound W to help get rid of this.   The area on your shoulder is unchanged from last year.  Keep the diet as clean as possible, stay active.  Let us know if you need anything.

## 2018-04-02 NOTE — Progress Notes (Signed)
Pre visit review using our clinic review tool, if applicable. No additional management support is needed unless otherwise documented below in the visit note. 

## 2018-04-06 ENCOUNTER — Other Ambulatory Visit: Payer: Self-pay | Admitting: Family Medicine

## 2018-04-06 DIAGNOSIS — R972 Elevated prostate specific antigen [PSA]: Secondary | ICD-10-CM

## 2018-04-18 DIAGNOSIS — L03115 Cellulitis of right lower limb: Secondary | ICD-10-CM | POA: Diagnosis not present

## 2018-04-22 ENCOUNTER — Ambulatory Visit: Payer: BLUE CROSS/BLUE SHIELD | Admitting: Family Medicine

## 2018-04-22 ENCOUNTER — Encounter: Payer: Self-pay | Admitting: Family Medicine

## 2018-04-22 VITALS — BP 118/84 | HR 67 | Temp 97.9°F | Ht 68.5 in | Wt 227.1 lb

## 2018-04-22 DIAGNOSIS — L03115 Cellulitis of right lower limb: Secondary | ICD-10-CM | POA: Diagnosis not present

## 2018-04-22 MED ORDER — DOXYCYCLINE HYCLATE 100 MG PO TABS: 100.0000 mg | ORAL_TABLET | Freq: Two times a day (BID) | ORAL | 0 refills | Status: DC

## 2018-04-22 NOTE — Progress Notes (Signed)
Chief Complaint  Patient presents with  . Leg Pain    right  . Cellulitis    Sean Bowers is a 62 y.o. male here for a skin complaint.  Duration: 6 days Location: RLE Pruritic? No Painful? Yes Drainage? No Other associated symptoms: Swelling Therapies tried thus far: Bactrim for 5 d  ROS:  Const: No fevers Skin: As noted in HPI  Past Medical History:  Diagnosis Date  . Allergy   . Arthritis   . Asthma    no inhalers  . Diverticulitis 09/2016   seen in Urgent Care  . Genital warts   . History of chicken pox   . History of hay fever   . Hyperlipidemia 09/27/2016  . Hypertension    Allergies  Allergen Reactions  . Penicillins Swelling    Swelling of lips Has patient had a PCN reaction causing immediate rash, facial/tongue/throat swelling, SOB or lightheadedness with hypotension: Yes Has patient had a PCN reaction causing severe rash involving mucus membranes or skin necrosis: Yes Has patient had a PCN reaction that required hospitalization: No Has patient had a PCN reaction occurring within the last 10 years: No If all of the above answers are "NO", then may proceed with Cephalosporin use.      BP 118/84 (BP Location: Left Arm, Patient Position: Sitting, Cuff Size: Large)   Pulse 67   Temp 97.9 F (36.6 C) (Oral)   Ht 5' 8.5" (1.74 m)   Wt 227 lb 2 oz (103 kg)   SpO2 97%   BMI 34.03 kg/m  Gen: awake, alert, appearing stated age Lungs: No accessory muscle use Skin: See below. + mild warmth, +TTP. No drainage, fluctuance, excoriation Psych: Age appropriate judgment and insight   RLE   Cellulitis of right lower extremity - Plan: doxycycline (VIBRA-TABS) 100 MG tablet  Change Bactrim to doxycycline.  Ideally, I would add Keflex for better strep coverage, however he has a strong reaction to penicillins. Warning signs and symptoms verbalized and written down in AVS.  F/u in 2 d, if no improvement I may have to send him downstairs to the ED. The patient  voiced understanding and agreement to the plan.  [place], DO 04/22/18 4:25 PM

## 2018-04-22 NOTE — Progress Notes (Signed)
Pre visit review using our clinic review tool, if applicable. No additional management support is needed unless otherwise documented below in the visit note. 

## 2018-04-22 NOTE — Patient Instructions (Signed)
Things to look out for: increasing pain not relieved by ibuprofen/acetaminophen, fevers, spreading redness, drainage of pus, or foul odor.  OK to stop the Bactrim.  Cancel appointment if you are doing much better.  Let us know if you need anything.

## 2018-04-24 ENCOUNTER — Ambulatory Visit: Payer: BLUE CROSS/BLUE SHIELD | Admitting: Family Medicine

## 2018-04-28 ENCOUNTER — Ambulatory Visit: Payer: Self-pay | Admitting: Medical

## 2018-04-28 ENCOUNTER — Encounter: Payer: Self-pay | Admitting: Medical

## 2018-04-28 VITALS — BP 143/89 | HR 88 | Temp 98.5°F | Resp 18 | Wt 223.6 lb

## 2018-04-28 DIAGNOSIS — N410 Acute prostatitis: Secondary | ICD-10-CM

## 2018-04-28 DIAGNOSIS — R300 Dysuria: Secondary | ICD-10-CM

## 2018-04-28 LAB — POCT URINALYSIS DIPSTICK
BILIRUBIN UA: NEGATIVE
Glucose, UA: NEGATIVE
Ketones, UA: NEGATIVE
Nitrite, UA: NEGATIVE
PH UA: 6.5 (ref 5.0–8.0)
PROTEIN UA: POSITIVE — AB
Spec Grav, UA: 1.015 (ref 1.010–1.025)
UROBILINOGEN UA: NEGATIVE U/dL — AB

## 2018-04-28 MED ORDER — CIPROFLOXACIN HCL 500 MG PO TABS: 500.0000 mg | ORAL_TABLET | Freq: Two times a day (BID) | ORAL | 0 refills | Status: DC

## 2018-04-28 NOTE — Progress Notes (Signed)
   Subjective:    Patient ID: Sean Bowers, male    DOB: 1956/09/21, 62 y.o.   MRN: [id number]  HPI 62 yo male in non acute distress. Started with frequency  And urgency of urination pain in penis at the tip with urinating , burning sensation.  No prior urinary tracat infections.  Declines STDs ,  He says he ;has a wife and only has sex intercourse  with wife.   His doctor is bring him in for recheck his PSA early July. History of elelvated PSAs.  2 weeks for ago had leg infection, seen by primary doctor placed on Doxycycline placed on it for  14 days. Still taking medication.  PCN allergy: swelling On Doxy for skin infection of the right lower leg, Seen by his doctor he has  4 days left. He says that the area is much better and does hurt like it did. Area was marked for patient to watch and it seems to be retracting back from the definition of the line.  Review of Systems  Constitutional: Negative for chills and fatigue.  Gastrointestinal: Negative for abdominal pain.  Genitourinary: Positive for decreased urine volume, difficulty urinating (painful to start), dysuria, frequency, penile pain (with urination and burning) and urgency. Negative for discharge, flank pain, hematuria, penile swelling, scrotal swelling and testicular pain.  Musculoskeletal: Negative for back pain.      Objective:   Physical Exam  Constitutional: He appears well-developed and well-nourished.  HENT:  Head: Normocephalic and atraumatic.  Abdominal: Soft. There is tenderness (suprapubic tenderness, needs to go to the bathroom.).  No abdominal distension No CVA tendernesss  Rectal- good tone, enlarged Prostate, no nodules , non tender.   Urine dip  1+ leukocytes, 1+ protein,. Mod blood  Urine culture Pending  GC/Chalymydia probe pending   Assessment & Plan:  Most likely Prostatitis though could be UTI. Will send off urine culture and GC/Chlamydia probe test. Meds ordered this encounter   Medications  . ciprofloxacin (CIPRO) 500 MG tablet    Sig: Take 1 tablet (500 mg total) by mouth 2 (two) times daily.    Dispense:  28 tablet    Refill:  0   Reviewed signs of symptoms of worsening results.  He verbalizes understanding .  He prefers I text him with the word results and he will call the office.  Patient verbalizes understanding and has no questions at [facility].  Patient also asked about a directive that his  doctor told him ," not to masturbate prior to his PSA test". Patient wanted to know if that was 2 days or  1 week.  I did touch base with Dr. Rosanna Randy which said call patients doctor.  Called  his doctors office. They will call patient and let him know the time frame. We well call patient on his GC/Chalmydia test results.

## 2018-04-28 NOTE — Patient Instructions (Signed)
Prostatitis Prostatitis is swelling or inflammation of the prostate gland. The prostate is a walnut-sized gland that is involved in the production of semen. It is located below a man's bladder, in front of the rectum. There are four types of prostatitis:  Chronic nonbacterial prostatitis. This is the most common type of prostatitis. It may be associated with a viral infection or autoimmune disorder.  Acute bacterial prostatitis. This is the least common type of prostatitis. It starts quickly and is usually associated with a bladder infection, high fever, and shaking chills. It can occur at any age.  Chronic bacterial prostatitis. This type usually results from acute bacterial prostatitis that happens repeatedly (is recurrent) or has not been treated properly. It can occur in men of any age but is most common among middle-aged men whose prostate has begun to get larger. The symptoms are not as severe as symptoms caused by acute bacterial prostatitis.  Prostatodynia or chronic pelvic pain syndrome (CPPS). This type is also called pelvic floor disorder. It is associated with increased muscular tone in the pelvis surrounding the prostate. What are the causes? Bacterial prostatitis is caused by infection from bacteria. Chronic nonbacterial prostatitis may be caused by:  Urinary tract infections (UTIs).  Nerve damage.  A response by the body's disease-fighting system (autoimmune response).  Chemicals in the urine. The causes of the other types of prostatitis are usually not known. What are the signs or symptoms? Symptoms of this condition vary depending upon the type of prostatitis. If you have acute bacterial prostatitis, you may experience:  Urinary symptoms, such as:  Painful urination.  Burning during urination.  Frequent and sudden urges to urinate.  Inability to start urinating.  A weak or interrupted stream of urine.  Vomiting.  Nausea.  Fever.  Chills.  Inability to  empty the bladder completely.  Pain in the:  Muscles or joints.  Lower back.  Lower abdomen. If you have any of the other types of prostatitis, you may experience:  Urinary symptoms, such as:  Sudden urges to urinate.  Frequent urination.  Difficulty starting urination.  Weak urine stream.  Dribbling after urination.  Discharge from the urethra. The urethra is a tube that opens at the end of the penis.  Pain in the:  Testicles.  Penis or tip of the penis.  Rectum.  Area in front of the rectum and below the scrotum (perineum).  Problems with sexual function.  Painful ejaculation.  Bloody semen. How is this diagnosed? This condition may be diagnosed based on:  A physical and medical exam.  Your symptoms.  A urine test to check for bacteria.  An exam in which a health care provider uses a finger to feel the prostate (digital rectal exam).  A test of a sample of semen.  Blood tests.  Ultrasound.  Removal of prostate tissue to be examined under a microscope (biopsy).  Tests to check how your body handles urine (urodynamic tests).  A test to look inside your bladder or urethra (cystoscopy). How is this treated? Treatment for this condition depends on the type of prostatitis. Treatment may involve:  Medicines to relieve pain or inflammation.  Medicines to help relax your muscles.  Physical therapy.  Heat therapy.  Techniques to help you control certain body functions (biofeedback).  Relaxation exercises.  Antibiotic medicine, if your condition is caused by bacteria.  Warm water baths (sitz baths). Sitz baths help with relaxing your pelvic floor muscles, which helps to relieve pressure on the prostate. Follow   these instructions at home:  Take over-the-counter and prescription medicines only as told by your health care provider.  If you were prescribed an antibiotic, take it as told by your health care provider. Do not stop taking the  antibiotic even if you start to feel better.  If physical therapy, biofeedback, or relaxation exercises were prescribed, do exercises as instructed.  Take sitz baths as directed by your health care provider. For a sitz bath, sit in warm water that is deep enough to cover your hips and buttocks.  Keep all follow-up visits as told by your health care provider. This is important. Contact a health care provider if:  Your symptoms get worse.  You have a fever. Get help right away if:  You have chills.  You feel nauseous.  You vomit.  You feel light-headed or feel like you are going to faint.  You are unable to urinate.  You have blood or blood clots in your urine. This information is not intended to replace advice given to you by your health care provider. Make sure you discuss any questions you have with your health care provider. Document Released: 11/01/2000 Document Revised: 07/25/2016 Document Reviewed: 07/25/2016 Elsevier Interactive Patient Education  2017 Elsevier Inc.  

## 2018-04-30 LAB — URINE CULTURE

## 2018-04-30 LAB — CHLAMYDIA/GONOCOCCUS/TRICHOMONAS, NAA
Chlamydia by NAA: NEGATIVE
GONOCOCCUS BY NAA: NEGATIVE
TRICH VAG BY NAA: NEGATIVE

## 2018-05-01 ENCOUNTER — Telehealth: Payer: Self-pay | Admitting: Family Medicine

## 2018-05-01 ENCOUNTER — Telehealth: Payer: Self-pay | Admitting: Adult Health

## 2018-05-01 NOTE — Telephone Encounter (Addendum)
Patient called in for results on 05/01/18 and he was informed testing for sexually transmitted disease as listed below. He was also advised of urine culture as below. He was advised to keep on current  Treatment plan as prescribed by colleague Liz Malady PA-C. He is appreciative of call and reports he is feeling " much better" and he denies any new symptoms.Advised to follow up if symptoms return or worsens at anytime and as always seek after hours care at urgent care of emergency room if clinic closed.  Patient verbalized understanding of all instructions given and denies any further questions at this time.    Urine Culture  Order: [id number]   Status:  Final result Visible to patient:  No (Not Released) Next appt:  05/18/2018 at 07:00 AM in Family Medicine ([facility]) Dx:  Burning with urination; Dysuria  Specimen Information: Urine     Component 3d ago  Urine Culture, Routine Final report   Organism ID, Bacteria Comment   Comment: Culture shows less than 10,000 colony forming units of bacteria per  milliliter of urine. This colony count is not generally considered  to be clinically significant.   Resulting Agency [facility]    Narrative  Performed by: Maryan Puls  Performed at: [address], [place], [place] [id number] Lab Director: Rush Farmer MD, Phone: [phone]    Specimen Collected: 04/28/18 14:40 Last Resulted: 04/30/18 05:36     Lab Flowsheet    Order Details    View Encounter    Lab and Collection Details    Routing    Result History            Chlamydia/Gonococcus/Trichomonas, NAA  Order: [id number]   Status:  Final result Visible to patient:  No (Not Released) Next appt:  05/18/2018 at 07:00 AM in Family Medicine ([facility]) Dx:  Burning with urination  Specimen Information: Urine      Ref Range & Units 3d ago  Chlamydia by NAA Negative Negative   Gonococcus by NAA Negative Negative   Trich vag by NAA Negative  Negative   Resulting Agency  [facility]    Narrative  Performed by: Maryan Puls  Performed at: [address] [place], [place] [id number] Lab Director: Rush Farmer MD, Phone: [phone]    Specimen Collected: 04/28/18 14:29 Last Resulted: 04/30/18 05:36     Lab Flowsheet    Order Details    View Encounter    Lab and Collection Details    Routing    Result History

## 2018-05-01 NOTE — Telephone Encounter (Signed)
Informed pt no masturbation 72 hours prior to PSA drawing to be safe. Pt voiced understanding. No further questions.

## 2018-05-01 NOTE — Telephone Encounter (Signed)
..  duplicate no additional patient contact made.

## 2018-05-18 ENCOUNTER — Other Ambulatory Visit: Payer: BLUE CROSS/BLUE SHIELD

## 2018-05-20 ENCOUNTER — Other Ambulatory Visit: Payer: Self-pay | Admitting: Family Medicine

## 2018-05-20 ENCOUNTER — Other Ambulatory Visit (INDEPENDENT_AMBULATORY_CARE_PROVIDER_SITE_OTHER): Payer: BLUE CROSS/BLUE SHIELD

## 2018-05-20 DIAGNOSIS — R972 Elevated prostate specific antigen [PSA]: Secondary | ICD-10-CM

## 2018-05-20 LAB — PSA: PSA: 5.21 ng/mL — AB (ref 0.10–4.00)

## 2018-05-22 ENCOUNTER — Other Ambulatory Visit: Payer: BLUE CROSS/BLUE SHIELD

## 2018-05-29 ENCOUNTER — Ambulatory Visit: Payer: Self-pay | Admitting: Adult Health

## 2018-05-29 ENCOUNTER — Encounter: Payer: Self-pay | Admitting: Adult Health

## 2018-05-29 VITALS — BP 123/81 | HR 78 | Temp 98.5°F | Resp 16 | Ht 68.0 in | Wt 221.0 lb

## 2018-05-29 DIAGNOSIS — L03011 Cellulitis of right finger: Secondary | ICD-10-CM

## 2018-05-29 MED ORDER — SULFAMETHOXAZOLE-TRIMETHOPRIM 800-160 MG PO TABS: 1.0000 | ORAL_TABLET | Freq: Two times a day (BID) | ORAL | 0 refills | Status: DC

## 2018-05-29 NOTE — Progress Notes (Addendum)
Subjective:     Patient ID: Sean Bowers, male   DOB: 06/01/1956, 62 y.o.   MRN: [id number]  HPI   Blood pressure 123/81, pulse 78, temperature 98.5 F (36.9 C), resp. rate 16, height 5\' 8"  (1.727 m), weight 221 lb (100.2 kg), SpO2 96 %.  Patient with right index finger soreness he noticed  on Wednesday 7/10 . Denies any injutry. Denies any numbness or tingling. Denies any drainage but can see yellow/green drainage at right edge of nail bed per patient. Tenderness to finger rated 7/10. He does report having a hang nail last week.   Patient  denies any fever, body aches,chills, rash, chest pain, shortness of breath, nausea, vomiting, or diarrhea.      Review of Systems  Constitutional: Negative.   HENT: Negative.   Respiratory: Negative.   Cardiovascular: Negative.   Gastrointestinal: Negative.   Musculoskeletal: Negative.   Skin: Positive for color change (right index finger ).  Psychiatric/Behavioral: Negative.        Objective:   Physical Exam  Constitutional: He is oriented to person, place, and time. He appears well-developed and well-nourished. He is active. No distress.  Patient is alert and oriented and responsive to questions Engages in eye contact with provider. Speaks in full sentences without any pauses without any shortness of breath or distress.    HENT:  Head: Normocephalic and atraumatic.  Eyes: Pupils are equal, round, and reactive to light. EOM are normal.  Neck: Normal range of motion. Neck supple.  Cardiovascular: Normal rate, regular rhythm, normal heart sounds and intact distal pulses. Exam reveals no gallop and no friction rub.  No murmur heard. Pulmonary/Chest: Effort normal and breath sounds normal. No stridor. No respiratory distress. He has no wheezes. He has no rales. He exhibits no tenderness.  Abdominal: Soft. Bowel sounds are normal.  Musculoskeletal:       Arms: Right index finger erythema and yellow discharge present under intact skin,  moderately swollen paronychia of right lateral nail fold.   Patient moves on and off of exam table and in room without difficulty. Gait is normal in hall and in room. Patient is oriented to person place time and situation. Patient answers questions appropriately and engages in conversation.   Neurological: He is alert and oriented to person, place, and time.  Skin: Skin is warm and dry. Capillary refill takes less than 2 seconds. He is not diaphoretic. There is erythema (right index finger ). No pallor.  Psychiatric: He has a normal mood and affect. His behavior is normal. Judgment and thought content normal.       Assessment:     Paronychia of right index finger       Plan:     Sterile technique used to perform cleaned area and right index finger. Paronychia right index finger right lateral nail fold without any difficulty. Patient tolerated without any difficulty. # 11  Scalpel use to make pinpoint incision and small  yellow  Non purulent drainage easily expressed from area. Hemostasis easily gained. Cleaned area again and applied neosporin and gauze bandage. Patient denies any pain.   Advised over the counter pain medication such as  tylenol or Motrin per package instructions if pain occurs.   Meds ordered this encounter  Medications  . sulfamethoxazole-trimethoprim (BACTRIM DS,SEPTRA DS) 800-160 MG tablet    Sig: Take 1 tablet by mouth 2 (two) times daily.    Dispense:  20 tablet    Refill:  0    Epson Salt  sterile warm water soaks  TID and keep clean and dry with soap and water apply Antibiotic over the counter oint,ment topically to area.   Take above antibiotics, watch for signs and symptoms of infection as discussed on AVS education summary sheet given and reviewed.  Provider recommended a 1 week follow up and patient is unable to return he is going to [facility] as his daughter is in the hospital after a blood transplant. He will be at the hospital with her but agrees to return  to office if does not resolve in 3 days or if worsens at anytime will seek medical treatment.   Advised patient call the office or your primary care doctor for an appointment if no improvement within 72 hours or if any symptoms change or worsen at any time  Advised ER or urgent Care if after hours or on weekend. Call 911 for emergency symptoms at any time.Patinet verbalized understanding of all instructions given/reviewed and treatment plan and has no further questions or concerns at this time.    Patient verbalized understanding of all instructions given and denies any further questions at this time.

## 2018-05-29 NOTE — Patient Instructions (Addendum)
Sulfamethoxazole; Trimethoprim, SMX-TMP tablets What is this medicine? SULFAMETHOXAZOLE; TRIMETHOPRIM or SMX-TMP (suhl fuh meth OK suh zohl; trye METH oh prim) is a combination of a sulfonamide antibiotic and a second antibiotic, trimethoprim. It is used to treat or prevent certain kinds of bacterial infections. It will not work for colds, flu, or other viral infections. This medicine may be used for other purposes; ask your health care provider or pharmacist if you have questions. COMMON BRAND NAME(S): Bacter-Aid DS, Bactrim, Bactrim DS, Septra, Septra DS What should I tell my health care provider before I take this medicine? They need to know if you have any of these conditions: -anemia -asthma -being treated with anticonvulsants -if you frequently drink alcohol containing drinks -kidney disease -liver disease -low level of folic acid or glucose-6-phosphate dehydrogenase -poor nutrition or malabsorption -porphyria -severe allergies -thyroid disorder -an unusual or allergic reaction to sulfamethoxazole, trimethoprim, sulfa drugs, other medicines, foods, dyes, or preservatives -pregnant or trying to get pregnant -breast-feeding How should I use this medicine? Take this medicine by mouth with a full glass of water. Follow the directions on the prescription label. Take your medicine at regular intervals. Do not take it more often than directed. Do not skip doses or stop your medicine early. Talk to your pediatrician regarding the use of this medicine in children. Special care may be needed. This medicine has been used in children as young as 2 months of age. Overdosage: If you think you have taken too much of this medicine contact a poison control center or emergency room at once. NOTE: This medicine is only for you. Do not share this medicine with others. What if I miss a dose? If you miss a dose, take it as soon as you can. If it is almost time for your next dose, take only that dose. Do  not take double or extra doses. What may interact with this medicine? Do not take this medicine with any of the following medications: -aminobenzoate potassium -dofetilide -metronidazole This medicine may also interact with the following medications: -ACE inhibitors like benazepril, enalapril, lisinopril, and ramipril -birth control pills -cyclosporine -digoxin -diuretics -indomethacin -medicines for diabetes -methenamine -methotrexate -phenytoin -potassium supplements -pyrimethamine -sulfinpyrazone -tricyclic antidepressants -warfarin This list may not describe all possible interactions. Give your health care provider a list of all the medicines, herbs, non-prescription drugs, or dietary supplements you use. Also tell them if you smoke, drink alcohol, or use illegal drugs. Some items may interact with your medicine. What should I watch for while using this medicine? Tell your doctor or health care professional if your symptoms do not improve. Drink several glasses of water a day to reduce the risk of kidney problems. Do not treat diarrhea with over the counter products. Contact your doctor if you have diarrhea that lasts more than 2 days or if it is severe and watery. This medicine can make you more sensitive to the sun. Keep out of the sun. If you cannot avoid being in the sun, wear protective clothing and use a sunscreen. Do not use sun lamps or tanning beds/booths. What side effects may I notice from receiving this medicine? Side effects that you should report to your doctor or health care professional as soon as possible: -allergic reactions like skin rash or hives, swelling of the face, lips, or tongue -breathing problems -fever or chills, sore throat -irregular heartbeat, chest pain -joint or muscle pain -pain or difficulty passing urine -red pinpoint spots on skin -redness, blistering, peeling or loosening of   the skin, including inside the mouth -unusual bleeding or  bruising -unusually weak or tired -yellowing of the eyes or skin Side effects that usually do not require medical attention (report to your doctor or health care professional if they continue or are bothersome): -diarrhea -dizziness -headache -loss of appetite -nausea, vomiting -nervousness This list may not describe all possible side effects. Call your doctor for medical advice about side effects. You may report side effects to FDA at 1-800-FDA-1088. Where should I keep my medicine? Keep out of the reach of children. Store at room temperature between 20 to 25 degrees C (68 to 77 degrees F). Protect from light. Throw away any unused medicine after the expiration date. NOTE: This sheet is a summary. It may not cover all possible information. If you have questions about this medicine, talk to your doctor, pharmacist, or health care provider.  2018 Elsevier/Gold Standard (2013-06-11 14:38:26) Fingertip Infection There are two main types of fingertip infections:  Paronychia. This is an infection that happens around your nail. This type of infection can start suddenly in one nail or occur gradually over time and affect more than one nail. Long-term (chronic) paronychia can make your fingernails thick and deformed.  Felon. This is a bacterial infection in the padded tip of your finger. Felon infection can cause a painful collection of pus (abscess) to form inside your fingertip. If the infection is not treated, the infection can spread as deep as the bone.  What are the causes? Paronychia infection can be caused by bacteria, funguses, or a mix of both. Felon infection is usually caused by the bacteria that are normally found on your skin. An infection can develop if the bacteria spread through your skin to the pad of tissue inside your fingertip. What increases the risk? A fingertip infection is more likely to develop in people:  Who have diabetes.  Who have a weak body defense (immune)  system.  Who work with their hands.  Whose hands are exposed to moisture, chemicals, or irritants for long periods of time.  Who have poor circulation.  Who bite, chew, or pick their fingernails.  What are the signs or symptoms? Symptoms of paronychia may affect one or more fingernails and include:  Pain, swelling, and redness around the nail.  Pus-filled pockets at the base or side of the fingernail (cuticle).  Thick fingernails that separate from the nail bed.  Pus that drains from the nail bed.  Symptoms of felon usually affect just one fingertip pad and include:  Severe, throbbing pain.  Redness.  Swelling.  Warmth.  Tenderness when the affected fingertip is touched.  How is this diagnosed? A fingertip infection is diagnosed with a medical history and physical exam. If there is pus draining from the infection, it may be swabbed and sent to the lab for a culture. An X-ray may be done to see if the infection has spread to the bone. How is this treated? Treatment for a fingertip infection may include:  Warm water or salt-water soaks several times per day.  Antibiotic medicine. This may be an ointment or pills.  Steroid ointment.  Antifungal pills.  Drainage of pus pockets. This is done by making a surgical cut (incision) to open the fingertip to drain pus.  Wearing gloves to protect your nails.  Follow these instructions at home: Medicines  Take or apply over-the-counter and prescription medicines only as told by your health care provider.  If you were prescribed antibiotic medicine, take or apply  it as told by your health care provider. Do not stop using the antibiotic even if your condition improves. Wound care  Clean the infected area each day with warm water or salt water, or as told by your health care provider. ? Gently wash the infected area with mild soap and water. ? Rinse the infected area with water to remove all soap. ? Pat the infected area  dry with a clean towel. Do not rub it. ? To make a salt-water mixture, completely dissolve -1 tsp of salt in 1 cup of warm water.  Follow instructions from your health care provider about: ? How to take care of the infection. ? When and how you should change your bandage (dressing). ? When you should remove your dressing.  Check the infected area every day for more signs of infection. Watch for: ? More redness, swelling, or pain. ? More fluid or blood. ? Warmth. ? A bad smell. General instructions  Keep the dressing dry until your health care provider says it can be removed. Do not take baths, swim, use a hot tub, or do anything that would put your wound underwater until your health care provider approves.  Raise (elevate) the infected area above the level of your heart while you are sitting or lying down or as told by your health care provider.  Do not scratch or pick at the infection.  Wear gloves as told by your health care provider, if this applies.  Keep all follow-up visits as told by your health care provider. This is important. How is this prevented?  Wear gloves when you work with your hands.  Wash your hands often with antibacterial soap.  Avoid letting your hands stay wet or irritated for long periods of time.  Do not bite your fingernails. Do not pull on your cuticles. Do not suck on your fingers.  Use clean scissors or nail clippers to trim your nails. Do not cut your fingernails very short. Contact a health care provider if:  Your pain medicine is not helping.  You have more redness, swelling, or pain at your fingertip.  You continue to have fluid, blood, or pus coming from your fingertip.  Your infection area feels warm to the touch.  You continue to notice a bad smell coming from your fingertip or your dressing. Get help right away if:  The area of redness is spreading, or you notice a red streak going away from your fingertip.  You have a  fever. This information is not intended to replace advice given to you by your health care provider. Make sure you discuss any questions you have with your health care provider. Document Released: 12/12/2004 Document Revised: 04/11/2016 Document Reviewed: 04/24/2015 Elsevier Interactive Patient Education  2018 [place] is an infection of the skin. It happens near a fingernail or toenail. It may cause pain and swelling around the nail. Usually, it is not serious and it clears up with treatment. Follow these instructions at home:  Soak the fingers or toes in warm water as told by your doctor. You may be told to do this for 20 minutes, 2-3 times a day.  Keep the area dry when you are not soaking it.  Take medicines only as told by your doctor.  If you were given an antibiotic medicine, finish all of it even if you start to feel better.  Keep the affected area clean.  Do not try to drain a fluid-filled bump yourself.  Wear rubber  gloves when putting your hands in water.  Wear gloves if your hands might touch cleaners or chemicals.  Follow your doctor's instructions about: ? Wound care. ? Bandage (dressing) changes and removal. Contact a doctor if:  Your symptoms get worse or do not improve.  You have a fever or chills.  You have redness spreading from the affected area.  You have more fluid, blood, or pus coming from the affected area.  Your finger or knuckle is swollen or is hard to move. This information is not intended to replace advice given to you by your health care provider. Make sure you discuss any questions you have with your health care provider. Document Released: 10/23/2009 Document Revised: 04/11/2016 Document Reviewed: 10/12/2014 Elsevier Interactive Patient Education  [employer].

## 2018-07-23 DIAGNOSIS — R3915 Urgency of urination: Secondary | ICD-10-CM | POA: Diagnosis not present

## 2018-07-23 DIAGNOSIS — N401 Enlarged prostate with lower urinary tract symptoms: Secondary | ICD-10-CM | POA: Diagnosis not present

## 2018-07-23 DIAGNOSIS — R350 Frequency of micturition: Secondary | ICD-10-CM | POA: Diagnosis not present

## 2018-07-23 DIAGNOSIS — R972 Elevated prostate specific antigen [PSA]: Secondary | ICD-10-CM | POA: Diagnosis not present

## 2018-08-20 DIAGNOSIS — R972 Elevated prostate specific antigen [PSA]: Secondary | ICD-10-CM | POA: Diagnosis not present

## 2018-08-20 DIAGNOSIS — C61 Malignant neoplasm of prostate: Secondary | ICD-10-CM | POA: Diagnosis not present

## 2018-08-27 DIAGNOSIS — C61 Malignant neoplasm of prostate: Secondary | ICD-10-CM | POA: Diagnosis not present

## 2018-08-27 DIAGNOSIS — N400 Enlarged prostate without lower urinary tract symptoms: Secondary | ICD-10-CM | POA: Diagnosis not present

## 2018-09-18 ENCOUNTER — Other Ambulatory Visit: Payer: Self-pay | Admitting: Family Medicine

## 2018-11-30 ENCOUNTER — Ambulatory Visit: Payer: BLUE CROSS/BLUE SHIELD | Admitting: Family Medicine

## 2018-11-30 ENCOUNTER — Encounter: Payer: Self-pay | Admitting: Family Medicine

## 2018-11-30 VITALS — BP 134/74 | HR 98 | Temp 98.2°F | Ht 68.0 in | Wt 223.2 lb

## 2018-11-30 DIAGNOSIS — I8393 Asymptomatic varicose veins of bilateral lower extremities: Secondary | ICD-10-CM | POA: Diagnosis not present

## 2018-11-30 DIAGNOSIS — E7800 Pure hypercholesterolemia, unspecified: Secondary | ICD-10-CM

## 2018-11-30 DIAGNOSIS — I10 Essential (primary) hypertension: Secondary | ICD-10-CM

## 2018-11-30 DIAGNOSIS — R1032 Left lower quadrant pain: Secondary | ICD-10-CM

## 2018-11-30 LAB — COMPREHENSIVE METABOLIC PANEL
ALBUMIN: 4.4 g/dL (ref 3.5–5.2)
ALK PHOS: 37 U/L — AB (ref 39–117)
ALT: 26 U/L (ref 0–53)
AST: 24 U/L (ref 0–37)
BILIRUBIN TOTAL: 1.4 mg/dL — AB (ref 0.2–1.2)
BUN: 12 mg/dL (ref 6–23)
CALCIUM: 9.6 mg/dL (ref 8.4–10.5)
CO2: 30 mEq/L (ref 19–32)
Chloride: 101 mEq/L (ref 96–112)
Creatinine, Ser: 0.91 mg/dL (ref 0.40–1.50)
GFR: 89.83 mL/min (ref >60.00)
Glucose, Bld: 88 mg/dL (ref 70–99)
POTASSIUM: 4.1 meq/L (ref 3.5–5.1)
Sodium: 140 mEq/L (ref 135–145)
Total Protein: 6.9 g/dL (ref 6.0–8.3)

## 2018-11-30 LAB — LIPID PANEL
CHOLESTEROL: 224 mg/dL — AB (ref 0–200)
HDL: 44 mg/dL (ref >39.00)
LDL Cholesterol: 151 mg/dL — ABNORMAL HIGH (ref 0–99)
NonHDL: 179.86
TRIGLYCERIDES: 142 mg/dL (ref 0.0–149.0)
Total CHOL/HDL Ratio: 5
VLDL: 28.4 mg/dL (ref 0.0–40.0)

## 2018-11-30 NOTE — Progress Notes (Signed)
Chief Complaint  Patient presents with  . Follow-up    Patient is here today for a 75-month-F/U.  He is currently fasting. He states that he had cellulitis in RLE and has a feeling of tightness and concerned about the erythema.  Visible difference between extremities.  Also states that he has a discomfort in his left lumbar region of abdomen since 12.23.19 when lifting heavy furniture which is intermittent.  Denies any issues with bowels.    Subjective Sean Bowers is a 63 y.o. male who presents for hypertension follow up. He does not monitor home blood pressures. He is compliant with medications. Patient has these side effects of medication: none He is adhering to a healthy diet overall. Current exercise: none  3 wks ago, was lifting furniture to help a friend. Did not notice anything immediately. He had LLQ discomfort that comes and goes. Lifting some Xmas decorations and had the same sensation afterwards. 2-6/10 in severity. Dull pain when it comes. Does not radiate. Eating will sometimes cause discomfort. No bruising, swelling, skin changes over area.   Hx of varicose veins and swelling. Inconvenient, not painful. Wondering if he needs another procedure?   Past Medical History:  Diagnosis Date  . Allergy   . Arthritis   . Asthma    no inhalers  . Diverticulitis 09/2016   seen in Urgent Care  . Genital warts   . History of chicken pox   . History of hay fever   . Hyperlipidemia 09/27/2016  . Hypertension     Review of Systems Cardiovascular: no chest pain Respiratory:  no shortness of breath  Exam BP 134/74 (BP Location: Left Arm, Patient Position: Sitting, Cuff Size: Normal)   Pulse 98   Temp 98.2 F (36.8 C) (Oral)   Ht 5\' 8"  (1.727 m)   Wt 223 lb 3.2 oz (101.2 kg)   SpO2 99%   BMI 33.94 kg/m  General:  well developed, well nourished, in no apparent distress Heart: RRR, no bruits, + LE edema Lungs: clear to auscultation, no accessory muscle use Abd: S, mild  ttp in LLQ, no masses or bulging GU: No inguinal hernia Psych: well oriented with normal range of affect and appropriate judgment/insight  Essential hypertension - Plan: Comprehensive metabolic panel  Pure hypercholesterolemia - Plan: Lipid panel  Left lower quadrant abdominal pain - Plan: US Abdomen Limited  Asymptomatic varicose veins of both lower extremities  Orders as above. Counseled on diet and exercise. Ck [place]. Could be abdominal msc strain.  Reassurance for VV, if bothersome enough we can refer to vasc surg, he declined at this time.  F/u in 6 mo for CPE. The patient voiced understanding and agreement to the plan.  [place], DO 11/30/18  8:19 AM

## 2018-11-30 NOTE — Patient Instructions (Addendum)
Give [place] 2-3 business days to get the results of your labs back.   Keep the diet clean and stay active.  Someone will reach out to you regarding your ultrasound.  Let us know if you need anything.

## 2018-12-09 ENCOUNTER — Ambulatory Visit (HOSPITAL_BASED_OUTPATIENT_CLINIC_OR_DEPARTMENT_OTHER)
Admission: RE | Admit: 2018-12-09 | Discharge: 2018-12-09 | Disposition: A | Discharge status: Home / Self Care | Payer: BLUE CROSS/BLUE SHIELD | Source: Ambulatory Visit | Attending: Family Medicine | Admitting: Family Medicine

## 2018-12-09 DIAGNOSIS — R1032 Left lower quadrant pain: Secondary | ICD-10-CM | POA: Diagnosis not present

## 2018-12-09 DIAGNOSIS — K4090 Unilateral inguinal hernia, without obstruction or gangrene, not specified as recurrent: Secondary | ICD-10-CM | POA: Diagnosis not present

## 2018-12-10 ENCOUNTER — Other Ambulatory Visit: Payer: Self-pay | Admitting: Family Medicine

## 2018-12-10 DIAGNOSIS — K4090 Unilateral inguinal hernia, without obstruction or gangrene, not specified as recurrent: Secondary | ICD-10-CM

## 2018-12-21 ENCOUNTER — Other Ambulatory Visit: Payer: Self-pay | Admitting: Urology

## 2018-12-21 DIAGNOSIS — C61 Malignant neoplasm of prostate: Secondary | ICD-10-CM

## 2019-01-07 ENCOUNTER — Ambulatory Visit: Payer: Self-pay | Admitting: Surgery

## 2019-01-07 DIAGNOSIS — K4090 Unilateral inguinal hernia, without obstruction or gangrene, not specified as recurrent: Secondary | ICD-10-CM | POA: Diagnosis not present

## 2019-01-07 DIAGNOSIS — K429 Umbilical hernia without obstruction or gangrene: Secondary | ICD-10-CM | POA: Diagnosis not present

## 2019-03-01 ENCOUNTER — Ambulatory Visit
Admission: RE | Admit: 2019-03-01 | Discharge: 2019-03-01 | Disposition: A | Discharge status: Home / Self Care | Payer: BLUE CROSS/BLUE SHIELD | Source: Ambulatory Visit | Attending: Urology | Admitting: Urology

## 2019-03-01 ENCOUNTER — Other Ambulatory Visit: Payer: Self-pay

## 2019-03-01 DIAGNOSIS — C61 Malignant neoplasm of prostate: Secondary | ICD-10-CM

## 2019-03-01 MED ORDER — GADOBENATE DIMEGLUMINE 529 MG/ML IV SOLN
20.0000 mL | Freq: Once | INTRAVENOUS | Status: AC | PRN
  Administered 2019-03-01: 20 mL via INTRAVENOUS

## 2019-03-08 ENCOUNTER — Other Ambulatory Visit: Payer: BLUE CROSS/BLUE SHIELD

## 2019-03-15 ENCOUNTER — Other Ambulatory Visit: Payer: Self-pay | Admitting: Family Medicine

## 2019-03-16 DIAGNOSIS — N400 Enlarged prostate without lower urinary tract symptoms: Secondary | ICD-10-CM | POA: Diagnosis not present

## 2019-03-16 DIAGNOSIS — C61 Malignant neoplasm of prostate: Secondary | ICD-10-CM | POA: Diagnosis not present

## 2019-04-21 ENCOUNTER — Other Ambulatory Visit: Payer: Self-pay

## 2019-04-21 ENCOUNTER — Telehealth: Payer: Self-pay | Admitting: Medical

## 2019-04-21 DIAGNOSIS — J069 Acute upper respiratory infection, unspecified: Secondary | ICD-10-CM

## 2019-04-21 DIAGNOSIS — J029 Acute pharyngitis, unspecified: Secondary | ICD-10-CM

## 2019-04-21 MED ORDER — AZITHROMYCIN 250 MG PO TABS: ORAL_TABLET | ORAL | 0 refills | Status: DC

## 2019-04-21 NOTE — Progress Notes (Signed)
Permission given to me to do telemedicine appt.  63 yo male in non acute distress. Started after Saturday,  after doing yard work and thought it initially was allergies.  Then when he work up on Sunday he felt worse with scratchy sore throat, nasal congestion, cough productive yellow. Nasal discharge after blowing nose also yellow. He denies fever or chills Has checked temperature at home.  Taking Nyquil for symptoms  a night. He denies any exposure to Covid-19. His daughter has Leukemia so he and his wife are very careful.   ROS Denies fever or chills Denies shortness of breath or wheezing or chest pain. Did have an inhaler one time when he was ill, but he does not have a history of Asthma or lung problems. He currently does not feel he needs an inhaler today.  He take Loratidine for allergies.  Allergies  Allergen Reactions  . Penicillins Swelling    Swelling of lips Has patient had a PCN reaction causing immediate rash, facial/tongue/throat swelling, SOB or lightheadedness with hypotension: Yes Has patient had a PCN reaction causing severe rash involving mucus membranes or skin necrosis: Yes Has patient had a PCN reaction that required hospitalization: No Has patient had a PCN reaction occurring within the last 10 years: No If all of the above answers are "NO", then may proceed with Cephalosporin use.     Current Outpatient Medications:  .  aspirin 81 MG tablet, Take 81 mg by mouth daily., Disp: , Rfl:  .  azithromycin (ZITHROMAX Z-PAK) 250 MG tablet, Take  2 tablets by mouth today then 1 tablet days 2-5 , take with food., Disp: 6 each, Rfl: 0 .  ibuprofen (ADVIL,MOTRIN) 600 MG tablet, Take 600 mg by mouth 2 (two) times daily as needed., Disp: , Rfl: 0 .  lisinopril-hydrochlorothiazide (ZESTORETIC) 20-25 MG tablet, Take 1 tablet by mouth once daily, Disp: 90 tablet, Rfl: 0 .  loratadine (CLARITIN) 10 MG tablet, Take 10 mg by mouth daily., Disp: , Rfl:  .  Multiple Vitamins-Minerals  (MULTIVITAMIN MEN PO), Take by mouth., Disp: , Rfl:  .  nitroGLYCERIN (NITROSTAT) 0.4 MG SL tablet, Place 1 tablet (0.4 mg total) under the tongue every 5 (five) minutes x 3 doses as needed for chest pain., Disp: 25 tablet, Rfl: 1   PE: Not completed due to telemedicine appt.   A&P URI and pharyngitis Meds ordered this encounter  Medications  . azithromycin (ZITHROMAX Z-PAK) 250 MG tablet    Sig: Take  2 tablets by mouth today then 1 tablet days 2-5 , take with food.    Dispense:  6 each    Refill:  0  Rest , increase fluids, OTC Motrin or Tylenol as directed on the package for  Fever or pain or not feeling well. Warned patient about decongestants in Nyquil can decrease effectiveness of blood pressure medications. Recommend OTC generic Mucinex (plain) take per package instructions. Call clinic or  for follow up with  His PCP if not improving in 3-5 days. He verbalizes understanding and has no questions a the end of our conversation.

## 2019-04-21 NOTE — Patient Instructions (Signed)
Pharyngitis  Pharyngitis is a sore throat (pharynx). This is when there is redness, pain, and swelling in your throat. Most of the time, this condition gets better on its own. In some cases, you may need medicine. Follow these instructions at home:  Take over-the-counter and prescription medicines only as told by your doctor. ? If you were prescribed an antibiotic medicine, take it as told by your doctor. Do not stop taking the antibiotic even if you start to feel better. ? Do not give children aspirin. Aspirin has been linked to Reye syndrome.  Drink enough water and fluids to keep your pee (urine) clear or pale yellow.  Get a lot of rest.  Rinse your mouth (gargle) with a salt-water mixture 3-4 times a day or as needed. To make a salt-water mixture, completely dissolve -1 tsp of salt in 1 cup of warm water.  If your doctor approves, you may use throat lozenges or sprays to soothe your throat. Contact a doctor if:  You have large, tender lumps in your neck.  You have a rash.  You cough up green, yellow-brown, or bloody spit. Get help right away if:  You have a stiff neck.  You drool or cannot swallow liquids.  You cannot drink or take medicines without throwing up.  You have very bad pain that does not go away with medicine.  You have problems breathing, and it is not from a stuffy nose.  You have new pain and swelling in your knees, ankles, wrists, or elbows. Summary  Pharyngitis is a sore throat (pharynx). This is when there is redness, pain, and swelling in your throat.  If you were prescribed an antibiotic medicine, take it as told by your doctor. Do not stop taking the antibiotic even if you start to feel better.  Most of the time, pharyngitis gets better on its own. Sometimes, you may need medicine. This information is not intended to replace advice given to you by your health care provider. Make sure you discuss any questions you have with your health care  provider. Document Released: 04/22/2008 Document Revised: 12/10/2016 Document Reviewed: 12/10/2016 Elsevier Interactive Patient Education  2019 [employer]. Upper Respiratory Infection, Adult An upper respiratory infection (URI) affects the nose, throat, and upper air passages. URIs are caused by germs (viruses). The most common type of URI is often called "the common cold." Medicines cannot cure URIs, but you can do things at home to relieve your symptoms. URIs usually get better within 7-10 days. Follow these instructions at home: Activity  Rest as needed.  If you have a fever, stay home from work or school until your fever is gone, or until your doctor says you may return to work or school. ? You should stay home until you cannot spread the infection anymore (you are not contagious). ? Your doctor may have you wear a face mask so you have less risk of spreading the infection. Relieving symptoms  Gargle with a salt-water mixture 3-4 times a day or as needed. To make a salt-water mixture, completely dissolve -1 tsp of salt in 1 cup of warm water.  Use a cool-mist humidifier to add moisture to the air. This can help you breathe more easily. Eating and drinking   Drink enough fluid to keep your pee (urine) pale yellow.  Eat soups and other clear broths. General instructions   Take over-the-counter and prescription medicines only as told by your doctor. These include cold medicines, fever reducers, and cough suppressants.  Do not use any products that contain nicotine or tobacco. These include cigarettes and e-cigarettes. If you need help quitting, ask your doctor.  Avoid being where people are smoking (avoid secondhand smoke).  Make sure you get regular shots and get the flu shot every year.  Keep all follow-up visits as told by your doctor. This is important. How to avoid spreading infection to others   Wash your hands often with soap and water. If you do not have soap and  water, use hand sanitizer.  Avoid touching your mouth, face, eyes, or nose.  Cough or sneeze into a tissue or your sleeve or elbow. Do not cough or sneeze into your hand or into the air. Contact a doctor if:  You are getting worse, not better.  You have any of these: ? A fever. ? Chills. ? Brown or red mucus in your nose. ? Yellow or brown fluid (discharge)coming from your nose. ? Pain in your face, especially when you bend forward. ? Swollen neck glands. ? Pain with swallowing. ? White areas in the back of your throat. Get help right away if:  You have shortness of breath that gets worse.  You have very bad or constant: ? Headache. ? Ear pain. ? Pain in your forehead, behind your eyes, and over your cheekbones (sinus pain). ? Chest pain.  You have long-lasting (chronic) lung disease along with any of these: ? Wheezing. ? Long-lasting cough. ? Coughing up blood. ? A change in your usual mucus.  You have a stiff neck.  You have changes in your: ? Vision. ? Hearing. ? Thinking. ? Mood. Summary  An upper respiratory infection (URI) is caused by a germ called a virus. The most common type of URI is often called "the common cold."  URIs usually get better within 7-10 days.  Take over-the-counter and prescription medicines only as told by your doctor. This information is not intended to replace advice given to you by your health care provider. Make sure you discuss any questions you have with your health care provider. Document Released: 04/22/2008 Document Revised: 06/27/2017 Document Reviewed: 06/27/2017 Elsevier Interactive Patient Education  2019 [employer].

## 2019-05-16 DIAGNOSIS — M549 Dorsalgia, unspecified: Secondary | ICD-10-CM | POA: Diagnosis not present

## 2019-05-16 DIAGNOSIS — M795 Residual foreign body in soft tissue: Secondary | ICD-10-CM | POA: Diagnosis not present

## 2019-05-16 DIAGNOSIS — S70362A Insect bite (nonvenomous), left thigh, initial encounter: Secondary | ICD-10-CM | POA: Diagnosis not present

## 2019-05-24 ENCOUNTER — Ambulatory Visit (INDEPENDENT_AMBULATORY_CARE_PROVIDER_SITE_OTHER): Payer: BC Managed Care – PPO | Admitting: Family Medicine

## 2019-05-24 ENCOUNTER — Other Ambulatory Visit: Payer: Self-pay

## 2019-05-24 ENCOUNTER — Encounter: Payer: Self-pay | Admitting: Family Medicine

## 2019-05-24 VITALS — BP 132/74 | HR 74 | Temp 98.0°F | Ht 69.0 in | Wt 214.0 lb

## 2019-05-24 DIAGNOSIS — S46811A Strain of other muscles, fascia and tendons at shoulder and upper arm level, right arm, initial encounter: Secondary | ICD-10-CM

## 2019-05-24 DIAGNOSIS — M545 Low back pain, unspecified: Secondary | ICD-10-CM

## 2019-05-24 DIAGNOSIS — R972 Elevated prostate specific antigen [PSA]: Secondary | ICD-10-CM | POA: Diagnosis not present

## 2019-05-24 DIAGNOSIS — G8929 Other chronic pain: Secondary | ICD-10-CM

## 2019-05-24 DIAGNOSIS — E7800 Pure hypercholesterolemia, unspecified: Secondary | ICD-10-CM

## 2019-05-24 DIAGNOSIS — Z0000 Encounter for general adult medical examination without abnormal findings: Secondary | ICD-10-CM

## 2019-05-24 LAB — LIPID PANEL
Cholesterol: 222 mg/dL — ABNORMAL HIGH (ref 0–200)
HDL: 46.4 mg/dL (ref >39.00)
LDL Cholesterol: 149 mg/dL — ABNORMAL HIGH (ref 0–99)
NonHDL: 175.39
Total CHOL/HDL Ratio: 5
Triglycerides: 132 mg/dL (ref 0.0–149.0)
VLDL: 26.4 mg/dL (ref 0.0–40.0)

## 2019-05-24 LAB — COMPREHENSIVE METABOLIC PANEL
ALT: 26 U/L (ref 0–53)
AST: 25 U/L (ref 0–37)
Albumin: 4.5 g/dL (ref 3.5–5.2)
Alkaline Phosphatase: 38 U/L — ABNORMAL LOW (ref 39–117)
BUN: 14 mg/dL (ref 6–23)
CO2: 28 mEq/L (ref 19–32)
Calcium: 9.2 mg/dL (ref 8.4–10.5)
Chloride: 102 mEq/L (ref 96–112)
Creatinine, Ser: 0.82 mg/dL (ref 0.40–1.50)
GFR: 95.16 mL/min (ref >60.00)
Glucose, Bld: 104 mg/dL — ABNORMAL HIGH (ref 70–99)
Potassium: 3.5 mEq/L (ref 3.5–5.1)
Sodium: 140 mEq/L (ref 135–145)
Total Bilirubin: 1.3 mg/dL — ABNORMAL HIGH (ref 0.2–1.2)
Total Protein: 6.8 g/dL (ref 6.0–8.3)

## 2019-05-24 LAB — PSA: PSA: 5.44 ng/mL — ABNORMAL HIGH (ref 0.10–4.00)

## 2019-05-24 LAB — HEMOGLOBIN A1C: Hgb A1c MFr Bld: 5.6 % (ref 4.6–6.5)

## 2019-05-24 MED ORDER — METHOCARBAMOL 500 MG PO TABS: 500.0000 mg | ORAL_TABLET | Freq: Three times a day (TID) | ORAL | 1 refills | Status: DC | PRN

## 2019-05-24 NOTE — Patient Instructions (Addendum)
The new Shingrix vaccine (for shingles) is a 2 shot series. It can make people feel low energy, achy and almost like they have the flu for 48 hours after injection. Please plan accordingly when deciding on when to get this shot. Call our office for a nurse visit appointment to get this. The second shot of the series is less severe regarding the side effects, but it still lasts 48 hours.   Give [place] 2-3 days to get the results of your labs back.   EXERCISES  RANGE OF MOTION (ROM) AND STRETCHING EXERCISES - Low Back Pain Most people with lower back pain will find that their symptoms get worse with excessive bending forward (flexion) or arching at the lower back (extension). The exercises that will help resolve your symptoms will focus on the opposite motion.  If you have pain, numbness or tingling which travels down into your buttocks, leg or foot, the goal of the therapy is for these symptoms to move closer to your back and eventually resolve. Sometimes, these leg symptoms will get better, but your lower back pain may worsen. This is often an indication of progress in your rehabilitation. Be very alert to any changes in your symptoms and the activities in which you participated in the 24 hours prior to the change. Sharing this information with your caregiver will allow him or her to most efficiently treat your condition. These exercises may help you when beginning to rehabilitate your injury. Your symptoms may resolve with or without further involvement from your physician, physical therapist or athletic trainer. While completing these exercises, remember:   Restoring tissue flexibility helps normal motion to return to the joints. This allows healthier, less painful movement and activity.  An effective stretch should be held for at least 30 seconds.  A stretch should never be painful. You should only feel a gentle lengthening or release in the stretched tissue. FLEXION RANGE OF MOTION AND STRETCHING  EXERCISES:  STRETCH - Flexion, Single Knee to Chest   Lie on a firm bed or floor with both legs extended in front of you.  Keeping one leg in contact with the floor, bring your opposite knee to your chest. Hold your leg in place by either grabbing behind your thigh or at your knee.  Pull until you feel a gentle stretch in your low back. Hold 30 seconds.  Slowly release your grasp and repeat the exercise with the opposite side. Repeat 2 times. Complete this exercise 3 times per week.   STRETCH - Flexion, Double Knee to Chest  Lie on a firm bed or floor with both legs extended in front of you.  Keeping one leg in contact with the floor, bring your opposite knee to your chest.  Tense your stomach muscles to support your back and then lift your other knee to your chest. Hold your legs in place by either grabbing behind your thighs or at your knees.  Pull both knees toward your chest until you feel a gentle stretch in your low back. Hold 30 seconds.  Tense your stomach muscles and slowly return one leg at a time to the floor. Repeat 2 times. Complete this exercise 3 times per week.   STRETCH - Low Trunk Rotation  Lie on a firm bed or floor. Keeping your legs in front of you, bend your knees so they are both pointed toward the ceiling and your feet are flat on the floor.  Extend your arms out to the side. This will stabilize  your upper body by keeping your shoulders in contact with the floor.  Gently and slowly drop both knees together to one side until you feel a gentle stretch in your low back. Hold for 30 seconds.  Tense your stomach muscles to support your lower back as you bring your knees back to the starting position. Repeat the exercise to the other side. Repeat 2 times. Complete this exercise at least 3 times per week.   EXTENSION RANGE OF MOTION AND FLEXIBILITY EXERCISES:  STRETCH - Extension, Prone on Elbows   Lie on your stomach on the floor, a bed will be too soft.  Place your palms about shoulder width apart and at the height of your head.  Place your elbows under your shoulders. If this is too painful, stack pillows under your chest.  Allow your body to relax so that your hips drop lower and make contact more completely with the floor.  Hold this position for 30 seconds.  Slowly return to lying flat on the floor. Repeat 2 times. Complete this exercise 3 times per week.   RANGE OF MOTION - Extension, Prone Press Ups  Lie on your stomach on the floor, a bed will be too soft. Place your palms about shoulder width apart and at the height of your head.  Keeping your back as relaxed as possible, slowly straighten your elbows while keeping your hips on the floor. You may adjust the placement of your hands to maximize your comfort. As you gain motion, your hands will come more underneath your shoulders.  Hold this position 30 seconds.  Slowly return to lying flat on the floor. Repeat 2 times. Complete this exercise 3 times per week.   RANGE OF MOTION- Quadruped, Neutral Spine   Assume a hands and knees position on a firm surface. Keep your hands under your shoulders and your knees under your hips. You may place padding under your knees for comfort.  Drop your head and point your tailbone toward the ground below you. This will round out your lower back like an angry cat. Hold this position for 30 seconds.  Slowly lift your head and release your tail bone so that your back sags into a large arch, like an old horse.  Hold this position for 30 seconds.  Repeat this until you feel limber in your low back.  Now, find your "sweet spot." This will be the most comfortable position somewhere between the two previous positions. This is your neutral spine. Once you have found this position, tense your stomach muscles to support your low back.  Hold this position for 30 seconds. Repeat 2 times. Complete this exercise 3 times per week.   STRENGTHENING  EXERCISES - Low Back Sprain These exercises may help you when beginning to rehabilitate your injury. These exercises should be done near your "sweet spot." This is the neutral, low-back arch, somewhere between fully rounded and fully arched, that is your least painful position. When performed in this safe range of motion, these exercises can be used for people who have either a flexion or extension based injury. These exercises may resolve your symptoms with or without further involvement from your physician, physical therapist or athletic trainer. While completing these exercises, remember:   Muscles can gain both the endurance and the strength needed for everyday activities through controlled exercises.  Complete these exercises as instructed by your physician, physical therapist or athletic trainer. Increase the resistance and repetitions only as guided.  You may experience muscle  soreness or fatigue, but the pain or discomfort you are trying to eliminate should never worsen during these exercises. If this pain does worsen, stop and make certain you are following the directions exactly. If the pain is still present after adjustments, discontinue the exercise until you can discuss the trouble with your caregiver.  STRENGTHENING - Deep Abdominals, Pelvic Tilt   Lie on a firm bed or floor. Keeping your legs in front of you, bend your knees so they are both pointed toward the ceiling and your feet are flat on the floor.  Tense your lower abdominal muscles to press your low back into the floor. This motion will rotate your pelvis so that your tail bone is scooping upwards rather than pointing at your feet or into the floor. With a gentle tension and even breathing, hold this position for 3 seconds. Repeat 2 times. Complete this exercise 3 times per week.   STRENGTHENING - Abdominals, Crunches   Lie on a firm bed or floor. Keeping your legs in front of you, bend your knees so they are both pointed  toward the ceiling and your feet are flat on the floor. Cross your arms over your chest.  Slightly tip your chin down without bending your neck.  Tense your abdominals and slowly lift your trunk high enough to just clear your shoulder blades. Lifting higher can put excessive stress on the lower back and does not further strengthen your abdominal muscles.  Control your return to the starting position. Repeat 2 times. Complete this exercise 3 times per week.   STRENGTHENING - Quadruped, Opposite UE/LE Lift   Assume a hands and knees position on a firm surface. Keep your hands under your shoulders and your knees under your hips. You may place padding under your knees for comfort.  Find your neutral spine and gently tense your abdominal muscles so that you can maintain this position. Your shoulders and hips should form a rectangle that is parallel with the floor and is not twisted.  Keeping your trunk steady, lift your right hand no higher than your shoulder and then your left leg no higher than your hip. Make sure you are not holding your breath. Hold this position for 30 seconds.  Continuing to keep your abdominal muscles tense and your back steady, slowly return to your starting position. Repeat with the opposite arm and leg. Repeat 2 times. Complete this exercise 3 times per week.   STRENGTHENING - Abdominals and Quadriceps, Straight Leg Raise   Lie on a firm bed or floor with both legs extended in front of you.  Keeping one leg in contact with the floor, bend the other knee so that your foot can rest flat on the floor.  Find your neutral spine, and tense your abdominal muscles to maintain your spinal position throughout the exercise.  Slowly lift your straight leg off the floor about 6 inches for a count of 3, making sure to not hold your breath.  Still keeping your neutral spine, slowly lower your leg all the way to the floor. Repeat this exercise with each leg 2 times. Complete this  exercise 3 times per week.  POSTURE AND BODY MECHANICS CONSIDERATIONS - Low Back Sprain Keeping correct posture when sitting, standing or completing your activities will reduce the stress put on different body tissues, allowing injured tissues a chance to heal and limiting painful experiences. The following are general guidelines for improved posture.  While reading these guidelines, remember:  The exercises prescribed  by your provider will help you have the flexibility and strength to maintain correct postures.  The correct posture provides the best environment for your joints to work. All of your joints have less wear and tear when properly supported by a spine with good posture. This means you will experience a healthier, less painful body.  Correct posture must be practiced with all of your activities, especially prolonged sitting and standing. Correct posture is as important when doing repetitive low-stress activities (typing) as it is when doing a single heavy-load activity (lifting).  RESTING POSITIONS Consider which positions are most painful for you when choosing a resting position. If you have pain with flexion-based activities (sitting, bending, stooping, squatting), choose a position that allows you to rest in a less flexed posture. You would want to avoid curling into a fetal position on your side. If your pain worsens with extension-based activities (prolonged standing, working overhead), avoid resting in an extended position such as sleeping on your stomach. Most people will find more comfort when they rest with their spine in a more neutral position, neither too rounded nor too arched. Lying on a non-sagging bed on your side with a pillow between your knees, or on your back with a pillow under your knees will often provide some relief. Keep in mind, being in any one position for a prolonged period of time, no matter how correct your posture, can still lead to stiffness.  PROPER SITTING  POSTURE In order to minimize stress and discomfort on your spine, you must sit with correct posture. Sitting with good posture should be effortless for a healthy body. Returning to good posture is a gradual process. Many people can work toward this most comfortably by using various supports until they have the flexibility and strength to maintain this posture on their own. When sitting with proper posture, your ears will fall over your shoulders and your shoulders will fall over your hips. You should use the back of the chair to support your upper back. Your lower back will be in a neutral position, just slightly arched. You may place a small pillow or folded towel at the base of your lower back for  support.  When working at a desk, create an environment that supports good, upright posture. Without extra support, muscles tire, which leads to excessive strain on joints and other tissues. Keep these recommendations in mind:  CHAIR:  A chair should be able to slide under your desk when your back makes contact with the back of the chair. This allows you to work closely.  The chair's height should allow your eyes to be level with the upper part of your monitor and your hands to be slightly lower than your elbows.  BODY POSITION  Your feet should make contact with the floor. If this is not possible, use a foot rest.  Keep your ears over your shoulders. This will reduce stress on your neck and low back.  INCORRECT SITTING POSTURES  If you are feeling tired and unable to assume a healthy sitting posture, do not slouch or slump. This puts excessive strain on your back tissues, causing more damage and pain. Healthier options include:  Using more support, like a lumbar pillow.  Switching tasks to something that requires you to be upright or walking.  Talking a brief walk.  Lying down to rest in a neutral-spine position.  PROLONGED STANDING WHILE SLIGHTLY LEANING FORWARD  When completing a task  that requires you to lean forward while  standing in one place for a long time, place either foot up on a stationary 2-4 inch high object to help maintain the best posture. When both feet are on the ground, the lower back tends to lose its slight inward curve. If this curve flattens (or becomes too large), then the back and your other joints will experience too much stress, tire more quickly, and can cause pain.  CORRECT STANDING POSTURES Proper standing posture should be assumed with all daily activities, even if they only take a few moments, like when brushing your teeth. As in sitting, your ears should fall over your shoulders and your shoulders should fall over your hips. You should keep a slight tension in your abdominal muscles to brace your spine. Your tailbone should point down to the ground, not behind your body, resulting in an over-extended swayback posture.   INCORRECT STANDING POSTURES  Common incorrect standing postures include a forward head, locked knees and/or an excessive swayback. WALKING Walk with an upright posture. Your ears, shoulders and hips should all line-up.  PROLONGED ACTIVITY IN A FLEXED POSITION When completing a task that requires you to bend forward at your waist or lean over a low surface, try to find a way to stabilize 3 out of 4 of your limbs. You can place a hand or elbow on your thigh or rest a knee on the surface you are reaching across. This will provide you more stability, so that your muscles do not tire as quickly. By keeping your knees relaxed, or slightly bent, you will also reduce stress across your lower back. CORRECT LIFTING TECHNIQUES  DO :  Assume a wide stance. This will provide you more stability and the opportunity to get as close as possible to the object which you are lifting.  Tense your abdominals to brace your spine. Bend at the knees and hips. Keeping your back locked in a neutral-spine position, lift using your leg muscles. Lift with your  legs, keeping your back straight.  Test the weight of unknown objects before attempting to lift them.  Try to keep your elbows locked down at your sides in order get the best strength from your shoulders when carrying an object.     Always ask for help when lifting heavy or awkward objects. INCORRECT LIFTING TECHNIQUES DO NOT:   Lock your knees when lifting, even if it is a small object.  Bend and twist. Pivot at your feet or move your feet when needing to change directions.  Assume that you can safely pick up even a paperclip without proper posture.   Trapezius stretches/exercises Do exercises exactly as told by your health care provider and adjust them as directed. It is normal to feel mild stretching, pulling, tightness, or discomfort as you do these exercises, but you should stop right away if you feel sudden pain or your pain gets worse.  Stretching and range of motion exercises These exercises warm up your muscles and joints and improve the movement and flexibility of your shoulder. These exercises can also help to relieve pain, numbness, and tingling. If you are unable to do any of the following for any reason, do not further attempt to do it.   Exercise A: Flexion, standing    1. Stand and hold a broomstick, a cane, or a similar object. Place your hands a little more than shoulder-width apart on the object. Your left / right hand should be palm-up, and your other hand should be palm-down. 2. Push the stick  to raise your left / right arm out to your side and then over your head. Use your other hand to help move the stick. Stop when you feel a stretch in your shoulder, or when you reach the angle that is recommended by your health care provider. ? Avoid shrugging your shoulder while you raise your arm. Keep your shoulder blade tucked down toward your spine. 3. Hold for 30 seconds. 4. Slowly return to the starting position. Repeat 2 times. Complete this exercise 3 times per  week.  Exercise B: Abduction, supine    1. Lie on your back and hold a broomstick, a cane, or a similar object. Place your hands a little more than shoulder-width apart on the object. Your left / right hand should be palm-up, and your other hand should be palm-down. 2. Push the stick to raise your left / right arm out to your side and then over your head. Use your other hand to help move the stick. Stop when you feel a stretch in your shoulder, or when you reach the angle that is recommended by your health care provider. ? Avoid shrugging your shoulder while you raise your arm. Keep your shoulder blade tucked down toward your spine. 3. Hold for 30 seconds. 4. Slowly return to the starting position. Repeat 2 times. Complete this exercise 3 times per week.  Exercise C: Flexion, active-assisted    1. Lie on your back. You may bend your knees for comfort. 2. Hold a broomstick, a cane, or a similar object. Place your hands about shoulder-width apart on the object. Your palms should face toward your feet. 3. Raise the stick and move your arms over your head and behind your head, toward the floor. Use your healthy arm to help your left / right arm move farther. Stop when you feel a gentle stretch in your shoulder, or when you reach the angle where your health care provider tells you to stop. 4. Hold for 30 seconds. 5. Slowly return to the starting position. Repeat 2 times. Complete this exercise 3 times per week.  Exercise D: External rotation and abduction    1. Stand in a door frame with one of your feet slightly in front of the other. This is called a staggered stance. 2. Choose one of the following positions as told by your health care provider: ? Place your hands and forearms on the door frame above your head. ? Place your hands and forearms on the door frame at the height of your head. ? Place your hands on the door frame at the height of your elbows. 3. Slowly move your weight onto  your front foot until you feel a stretch across your chest and in the front of your shoulders. Keep your head and chest upright and keep your abdominal muscles tight. 4. Hold for 30 seconds. 5. To release the stretch, shift your weight to your back foot. Repeat 2 times. Complete this stretch 3 times per week.  Strengthening exercises These exercises build strength and endurance in your shoulder. Endurance is the ability to use your muscles for a long time, even after your muscles get tired. Exercise E: Scapular depression and adduction  1. Sit on a stable chair. Support your arms in front of you with pillows, armrests, or a tabletop. Keep your elbows in line with the sides of your body. 2. Gently move your shoulder blades down toward your middle back. Relax the muscles on the tops of your shoulders and in  the back of your neck. 3. Hold for 3 seconds. 4. Slowly release the tension and relax your muscles completely before doing this exercise again. Repeat for a total of 10 repetitions. 5. After you have practiced this exercise, try doing the exercise without the arm support. Then, try the exercise while standing instead of sitting. Repeat 2 times. Complete this exercise 3 times per week.  Exercise F: Shoulder abduction, isometric    1. Stand or sit about 4-6 inches (10-15 cm) from a wall with your left / right side facing the wall. 2. Bend your left / right elbow and gently press your elbow against the wall. 3. Increase the pressure slowly until you are pressing as hard as you can without shrugging your shoulder. 4. Hold for 3 seconds. 5. Slowly release the tension and relax your muscles completely. Repeat for a total of 10 repetitions. Repeat 2 times. Complete this exercise 3 times per week.  Exercise G: Shoulder flexion, isometric    1. Stand or sit about 4-6 inches (10-15 cm) away from a wall with your left / right side facing the wall. 2. Keep your left / right elbow straight and  gently press the top of your fist against the wall. Increase the pressure slowly until you are pressing as hard as you can without shrugging your shoulder. 3. Hold for 10-15 seconds. 4. Slowly release the tension and relax your muscles completely. Repeat for a total of 10 repetitions. Repeat 2 times. Complete this exercise 3 times per week.  Exercise H: Internal rotation    1. Sit in a stable chair without armrests, or stand. Secure an exercise band at your left / right side, at elbow height. 2. Place a soft object, such as a folded towel or a small pillow, under your left / right upper arm so your elbow is a few inches (about 8 cm) away from your side. 3. Hold the end of the exercise band so the band stretches. 4. Keeping your elbow pressed against the soft object under your arm, move your forearm across your body toward your abdomen. Keep your body steady so the movement is only coming from your shoulder. 5. Hold for 3 seconds. 6. Slowly return to the starting position. Repeat for a total of 10 repetitions. Repeat 2 times. Complete this exercise 3 times per week.  Exercise I: External rotation    1. Sit in a stable chair without armrests, or stand. 2. Secure an exercise band at your left / right side, at elbow height. 3. Place a soft object, such as a folded towel or a small pillow, under your left / right upper arm so your elbow is a few inches (about 8 cm) away from your side. 4. Hold the end of the exercise band so the band stretches. 5. Keeping your elbow pressed against the soft object under your arm, move your forearm out, away from your abdomen. Keep your body steady so the movement is only coming from your shoulder. 6. Hold for 3 seconds. 7. Slowly return to the starting position. Repeat for a total of 10 repetitions. Repeat 2 times. Complete this exercise 3 times per week. Exercise J: Shoulder extension  1. Sit in a stable chair without armrests, or stand. Secure an exercise  band to a stable object in front of you so the band is at shoulder height. 2. Hold one end of the exercise band in each hand. Your palms should face each other. 3. Straighten your elbows and  lift your hands up to shoulder height. 4. Step back, away from the secured end of the exercise band, until the band stretches. 5. Squeeze your shoulder blades together and pull your hands down to the sides of your thighs. Stop when your hands are straight down by your sides. Do not let your hands go behind your body. 6. Hold for 3 seconds. 7. Slowly return to the starting position. Repeat for a total of 10 repetitions. Repeat 2 times. Complete this exercise 3 times per week.  Exercise K: Shoulder extension, prone    1. Lie on your abdomen on a firm surface so your left / right arm hangs over the edge. 2. Hold a 5 lb weight in your hand so your palm faces in toward your body. Your arm should be straight. 3. Squeeze your shoulder blade down toward the middle of your back. 4. Slowly raise your arm behind you, up to the height of the surface that you are lying on. Keep your arm straight. 5. Hold for 3 seconds. 6. Slowly return to the starting position and relax your muscles. Repeat for a total of 10 repetitions. Repeat 2 times. Complete this exercise 3 times per week.   Exercise L: Horizontal abduction, prone  1. Lie on your abdomen on a firm surface so your left / right arm hangs over the edge. 2. Hold a 5 lb weight in your hand so your palm faces toward your feet. Your arm should be straight. 3. Squeeze your shoulder blade down toward the middle of your back. 4. Bend your elbow so your hand moves up, until your elbow is bent to an "L" shape (90 degrees). With your elbow bent, slowly move your forearm forward and up. Raise your hand up to the height of the surface that you are lying on. ? Your upper arm should not move, and your elbow should stay bent. ? At the top of the movement, your palm should face  the floor. 5. Hold for 3 seconds. 6. Slowly return to the starting position and relax your muscles. Repeat for a total of 10 repetitions. Repeat 2 times. Complete this exercise 3 times per week.  Exercise M: Horizontal abduction, standing  1. Sit on a stable chair, or stand. 2. Secure an exercise band to a stable object in front of you so the band is at shoulder height. 3. Hold one end of the exercise band in each hand. 4. Straighten your elbows and lift your hands straight in front of you, up to shoulder height. Your palms should face down, toward the floor. 5. Step back, away from the secured end of the exercise band, until the band stretches. 6. Move your arms out to your sides, and keep your arms straight. 7. Hold for 3 seconds. 8. Slowly return to the starting position. Repeat for a total of 10 repetitions. Repeat 2 times. Complete this exercise 3 times per week.  Exercise N: Scapular retraction and elevation  1. Sit on a stable chair, or stand. 2. Secure an exercise band to a stable object in front of you so the band is at shoulder height. 3. Hold one end of the exercise band in each hand. Your palms should face each other. 4. Sit in a stable chair without armrests, or stand. 5. Step back, away from the secured end of the exercise band, until the band stretches. 6. Squeeze your shoulder blades together and lift your hands over your head. Keep your elbows straight. 7. Hold for  3 seconds. 8. Slowly return to the starting position. Repeat for a total of 10 repetitions. Repeat 2 times. Complete this exercise 3 times per week.  This information is not intended to replace advice given to you by your health care provider. Make sure you discuss any questions you have with your health care provider. Document Released: 11/04/2005 Document Revised: 07/11/2016 Document Reviewed: 09/21/2015 Elsevier Interactive Patient Education  2017 [employer].

## 2019-05-24 NOTE — Progress Notes (Signed)
Chief Complaint  Patient presents with  . Annual Exam    Well Male Sean Bowers is here for a complete physical.   His last physical was >1 year ago.  Current diet: in general, a "healthy" diet.  Current exercise: none, active at work Weight trend: stable Daytime fatigue? No. Seat belt? Yes.    Health maintenance Shingrix- No Colonoscopy- Yes Tetanus- Yes HIV- Yes Hep C- Yes   Past Medical History:  Diagnosis Date  . Allergy   . Arthritis   . Asthma    no inhalers  . Diverticulitis 09/2016   seen in Urgent Care  . Genital warts   . History of chicken pox   . History of hay fever   . Hyperlipidemia 09/27/2016  . Hypertension       Past Surgical History:  Procedure Laterality Date  . CARPAL TUNNEL RELEASE Bilateral 2006-2007    Medications  Current Outpatient Medications on File Prior to Visit  Medication Sig Dispense Refill  . aspirin 81 MG tablet Take 81 mg by mouth daily.    Marland Kitchen azithromycin (ZITHROMAX Z-PAK) 250 MG tablet Take  2 tablets by mouth today then 1 tablet days 2-5 , take with food. 6 each 0  . ibuprofen (ADVIL,MOTRIN) 600 MG tablet Take 600 mg by mouth 2 (two) times daily as needed.  0  . lisinopril-hydrochlorothiazide (ZESTORETIC) 20-25 MG tablet Take 1 tablet by mouth once daily 90 tablet 0  . loratadine (CLARITIN) 10 MG tablet Take 10 mg by mouth daily.    . Multiple Vitamins-Minerals (MULTIVITAMIN MEN PO) Take by mouth.    . nitroGLYCERIN (NITROSTAT) 0.4 MG SL tablet Place 1 tablet (0.4 mg total) under the tongue every 5 (five) minutes x 3 doses as needed for chest pain. 25 tablet 1   Allergies Allergies  Allergen Reactions  . Penicillins Swelling    Swelling of lips Has patient had a PCN reaction causing immediate rash, facial/tongue/throat swelling, SOB or lightheadedness with hypotension: Yes Has patient had a PCN reaction causing severe rash involving mucus membranes or skin necrosis: Yes Has patient had a PCN reaction that required  hospitalization: No Has patient had a PCN reaction occurring within the last 10 years: No If all of the above answers are "NO", then may proceed with Cephalosporin use.    Family History Family History  Problem Relation Age of Onset  . Diabetes Mother   . Heart disease Mother   . Hypertension Mother   . Breast cancer Mother   . Arthritis Mother   . Cancer Mother        breast  . Hyperlipidemia Mother   . Heart disease Father   . Stroke Father   . Arthritis Father   . Cancer Father        prostate  . Hyperlipidemia Father   . Heart disease Paternal Grandmother   . Cancer Maternal Uncle        lung  . Cancer Paternal Aunt        lung  . Cancer Paternal Uncle        lung    Review of Systems: Constitutional:  no fevers Eye:  no recent significant change in vision Ear/Nose/Mouth/Throat:  Ears:  no hearing loss Nose/Mouth/Throat:  no complaints of nasal congestion, no sore throat Cardiovascular:  no chest pain, no palpitations Respiratory:  no cough and no shortness of breath Gastrointestinal:  no abdominal pain, no change in bowel habits GU:  Male: negative for dysuria, frequency, and  incontinence and negative for prostate symptoms Musculoskeletal/Extremities:  +R intermittent lower back pain, +intermittent R shoulder pain; otherwise no pain, redness, or swelling of the joints Integumentary (Skin/Breast):  no abnormal skin lesions reported Neurologic:  no headaches Endocrine: No unexpected weight changes Hematologic/Lymphatic:  no abnormal bleeding  Exam BP 132/74 (BP Location: Left Arm, Patient Position: Sitting, Cuff Size: Large)   Pulse 74   Temp 98 F (36.7 C) (Oral)   Ht 5\' 9"  (1.753 m)   Wt 214 lb (97.1 kg)   SpO2 96%   BMI 31.60 kg/m  General:  well developed, well nourished, in no apparent distress Skin:  no significant moles, warts, or growths Head:  no masses, lesions, or tenderness Eyes:  pupils equal and round, sclera anicteric without  injection Ears:  canals without lesions, TMs shiny without retraction, no obvious effusion, no erythema Nose:  nares patent, septum midline, mucosa normal Throat/Pharynx:  lips and gingiva without lesion; tongue and uvula midline; non-inflamed pharynx; no exudates or postnasal drainage Neck: neck supple without adenopathy, thyromegaly, or masses Lungs:  clear to auscultation, breath sounds equal bilaterally, no respiratory distress Cardio:  regular rate and rhythm, no LE edema, no bruits Abdomen:  abdomen soft, nontender; bowel sounds normal; no masses or organomegaly Rectal: Deferred Musculoskeletal:  symmetrical muscle groups noted without atrophy or deformity; no current ttp but does point to trap and R lateral lumbar region; neg straight leg b/l but poor hamstring rom; Neg Neer's, cross over, Speed's, Hawkins Extremities:  no clubbing, cyanosis, or edema, no deformities, no skin discoloration Neuro:  gait normal; deep tendon reflexes normal and symmetric Psych: well oriented with normal range of affect and appropriate judgment/insight  Assessment and Plan  Well adult exam - Plan: Comprehensive metabolic panel, Lipid panel, Hemoglobin A1c  Elevated PSA, less than 10 ng/ml - Plan: Comprehensive metabolic panel, Lipid panel, Hemoglobin A1c, PSA  Strain of right trapezius muscle, initial encounter - Plan: heat, stretches/exercises  Chronic right-sided low back pain without sciatica - Plan: stretches/exercises, heat, Robaxin prn.   Well 63 y.o. male. Counseled on diet and exercise. PSA for hx of prostate cancer. Stretches/exercises for above, if no improvement in 4-5 weeks, will refer for PT.  Immunizations, labs, and further orders as above. Follow up in 6 mo or prn. The patient voiced understanding and agreement to the plan.  [place], DO 05/24/19 9:32 AM

## 2019-06-13 ENCOUNTER — Other Ambulatory Visit: Payer: Self-pay | Admitting: Family Medicine

## 2019-08-05 ENCOUNTER — Ambulatory Visit: Payer: Self-pay | Admitting: *Deleted

## 2019-08-05 NOTE — Telephone Encounter (Signed)
Patient is going to encourage wife to test- especially if grandson's test is + due to her exposure- they may isolate from each other at home until results have returned.  Reason for Disposition . [1] No COVID-19 EXPOSURE BUT [2] living with someone who was exposed and who has no symptoms of COVID-19  Answer Assessment - Initial Assessment Questions 1. CLOSE CONTACT: "Who is the person with the confirmed or suspected COVID-19 infection that you were exposed to?"     Grandson exhibiting symptoms- fever sore throat and headache 2. PLACE of CONTACT: "Where were you when you were exposed to COVID-19?" (e.g., home, school, medical waiting room; which city?)     Wife was in contact with grandson 5-6 days ago 3. TYPE of CONTACT: "How much contact was there?" (e.g., sitting next to, live in same house, work in same office, same building)     At home 4. DURATION of CONTACT: "How long were you in contact with the COVID-19 patient?" (e.g., a few seconds, passed by person, a few minutes, live with the patient)     Not sure how long 5. DATE of CONTACT: "When did you have contact with a COVID-19 patient?" (e.g., how many days ago)     5-6 days ago 6. TRAVEL: "Have you traveled out of the country recently?" If so, "When and where?"     * Also ask about out-of-state travel, since the CDC has identified some high-risk cities for community spread in the [place].     * Note: Travel becomes less relevant if there is widespread community transmission where the patient lives.     no 7. COMMUNITY SPREAD: "Are there lots of cases of COVID-19 (community spread) where you live?" (See public health department website, if unsure)       Community spread 8. SYMPTOMS: "Do you have any symptoms?" (e.g., fever, cough, breathing difficulty)     Wife- no symptoms 9. PREGNANCY OR POSTPARTUM: "Is there any chance you are pregnant?" "When was your last menstrual period?" "Did you deliver in the last 2 weeks?"     n/a 10. HIGH RISK:  "Do you have any heart or lung problems? Do you have a weak immune system?" (e.g., CHF, COPD, asthma, HIV positive, chemotherapy, renal failure, diabetes mellitus, sickle cell anemia)       no  Protocols used: CORONAVIRUS (COVID-19) EXPOSURE-A-AH

## 2019-08-05 NOTE — Telephone Encounter (Signed)
Needs virtual visit please.  

## 2019-08-06 NOTE — Telephone Encounter (Signed)
LM for pt to go to [facility] testing site or to call for virtual visit

## 2019-08-13 ENCOUNTER — Encounter: Payer: Self-pay | Admitting: Family Medicine

## 2019-09-02 DIAGNOSIS — N400 Enlarged prostate without lower urinary tract symptoms: Secondary | ICD-10-CM | POA: Diagnosis not present

## 2019-09-02 DIAGNOSIS — C61 Malignant neoplasm of prostate: Secondary | ICD-10-CM | POA: Diagnosis not present

## 2019-09-08 ENCOUNTER — Telehealth: Payer: Self-pay | Admitting: Medical

## 2019-09-08 ENCOUNTER — Other Ambulatory Visit: Payer: Self-pay

## 2019-09-08 NOTE — Progress Notes (Signed)
Permission to evaluate and treat by telemedicine.   63 yo male in non acute distress. Call with "blocked ears"  "it feels like I am talking in a bucket" starting yesterday.  No pain, no discharge, no fever or chills, or cough, SOB or CP. History of seasonal allergies in the spring , stops non-sedative anti-histamine and nasal steroid after spring season.  Allergies  Allergen Reactions  . Penicillins Swelling    Swelling of lips Has patient had a PCN reaction causing immediate rash, facial/tongue/throat swelling, SOB or lightheadedness with hypotension: Yes Has patient had a PCN reaction causing severe rash involving mucus membranes or skin necrosis: Yes Has patient had a PCN reaction that required hospitalization: No Has patient had a PCN reaction occurring within the last 10 years: No If all of the above answers are "NO", then may proceed with Cephalosporin use.     Current Outpatient Medications:  .  aspirin 81 MG tablet, Take 81 mg by mouth daily., Disp: , Rfl:  .  ibuprofen (ADVIL,MOTRIN) 600 MG tablet, Take 600 mg by mouth 2 (two) times daily as needed., Disp: , Rfl: 0 .  lisinopril-hydrochlorothiazide (ZESTORETIC) 20-25 MG tablet, Take 1 tablet by mouth once daily, Disp: 90 tablet, Rfl: 0 .  loratadine (CLARITIN) 10 MG tablet, Take 10 mg by mouth daily., Disp: , Rfl:  .  methocarbamol (ROBAXIN) 500 MG tablet, Take 1 tablet (500 mg total) by mouth every 8 (eight) hours as needed for muscle spasms., Disp: 30 tablet, Rfl: 1 .  Multiple Vitamins-Minerals (MULTIVITAMIN MEN PO), Take by mouth., Disp: , Rfl:  .  nitroGLYCERIN (NITROSTAT) 0.4 MG SL tablet, Place 1 tablet (0.4 mg total) under the tongue every 5 (five) minutes x 3 doses as needed for chest pain., Disp: 25 tablet, Rfl: 1  Review of Systems  Constitutional: Negative for chills and fever.  HENT: Negative.   Respiratory: Negative.   Cardiovascular: Negative.   Skin: Negative.   Neurological: Negative for headaches.   Endo/Heme/Allergies: Positive for environmental allergies.     PE: none preformed due to telemedicine visit.  DX/Plan  Eustachian Tube Dysfunction bilaterally, most likely due to seasonal allergies. Restart Allegra-D and Flonase. He is skeptical because he has not had fall allergies before. OTC Tylenol per package directions for fever or discomfort/pain.Return to clinic or follow up with primary doctor if not improvng in  3-5 days. Patient verbalizes understanding and has no questions at the end of our conversations.

## 2019-09-12 ENCOUNTER — Other Ambulatory Visit: Payer: Self-pay | Admitting: Family Medicine

## 2019-09-22 ENCOUNTER — Ambulatory Visit: Payer: Self-pay

## 2019-09-22 ENCOUNTER — Other Ambulatory Visit: Payer: Self-pay

## 2019-09-22 DIAGNOSIS — Z23 Encounter for immunization: Secondary | ICD-10-CM

## 2019-11-24 ENCOUNTER — Other Ambulatory Visit: Payer: Self-pay

## 2019-11-24 ENCOUNTER — Encounter: Payer: Self-pay | Admitting: Family Medicine

## 2019-11-24 ENCOUNTER — Ambulatory Visit (INDEPENDENT_AMBULATORY_CARE_PROVIDER_SITE_OTHER): Payer: Self-pay | Admitting: Family Medicine

## 2019-11-24 DIAGNOSIS — I10 Essential (primary) hypertension: Secondary | ICD-10-CM

## 2019-11-24 DIAGNOSIS — E7800 Pure hypercholesterolemia, unspecified: Secondary | ICD-10-CM

## 2019-11-24 NOTE — Progress Notes (Signed)
Chief Complaint  Patient presents with  . Follow-up    6 month    Subjective Sean Bowers is a 64 y.o. male who presents for hypertension follow up. Due to COVID-19 pandemic, we are interacting via web portal for an electronic face-to-face visit. I verified patient's ID using 2 identifiers. Patient agreed to proceed with visit via this method. Patient is at home, I am at office. Patient, his wife Sean Bowers, and I are present for visit.  He does not monitor home blood pressures. He is compliant with medications- Prinzide 20-25 mg/d. Patient has these side effects of medication: none He is adhering to a healthy diet overall. Current exercise: active through work, some walking  Hyperlipidemia Patient presents for hyperlipidemia follow up. Currently is not taking any medications routinely.  Diet and exercise as above.  The patient is not known to have coexisting coronary artery disease.    Past Medical History:  Diagnosis Date  . Allergy   . Arthritis   . Asthma    no inhalers  . Diverticulitis 09/2016   seen in Urgent Care  . Genital warts   . History of chicken pox   . History of hay fever   . Hyperlipidemia 09/27/2016  . Hypertension     Review of Systems Cardiovascular: no chest pain Respiratory:  no shortness of breath  Exam No conversational dyspnea Age appropriate judgment and insight Nml affect and mood  Essential hypertension - Plan: Comp Met (CMET)  Pure hypercholesterolemia - Plan: Lipid Profile  Orders as above. Counseled on diet and exercise. F/u in 6 mo for CPE or prn. The patient voiced understanding and agreement to the plan.  [place], DO 11/24/19  7:46 AM

## 2019-12-19 ENCOUNTER — Other Ambulatory Visit: Payer: Self-pay | Admitting: Family Medicine

## 2020-02-24 DIAGNOSIS — C61 Malignant neoplasm of prostate: Secondary | ICD-10-CM | POA: Diagnosis not present

## 2020-02-25 ENCOUNTER — Other Ambulatory Visit: Payer: Self-pay

## 2020-02-28 ENCOUNTER — Ambulatory Visit: Payer: BC Managed Care – PPO | Admitting: Family Medicine

## 2020-02-28 ENCOUNTER — Encounter: Payer: Self-pay | Admitting: Family Medicine

## 2020-02-28 ENCOUNTER — Other Ambulatory Visit: Payer: Self-pay

## 2020-02-28 VITALS — BP 130/71 | HR 74 | Temp 98.5°F | Resp 12 | Ht 69.0 in | Wt 212.6 lb

## 2020-02-28 DIAGNOSIS — M25512 Pain in left shoulder: Secondary | ICD-10-CM

## 2020-02-28 DIAGNOSIS — Z8669 Personal history of other diseases of the nervous system and sense organs: Secondary | ICD-10-CM

## 2020-02-28 DIAGNOSIS — G8929 Other chronic pain: Secondary | ICD-10-CM

## 2020-02-28 NOTE — Progress Notes (Signed)
Musculoskeletal Exam  Patient: Sean Bowers DOB: May 10, 1956  DOS: 02/28/2020  SUBJECTIVE:  Chief Complaint:   Chief Complaint  Patient presents with  . Shoulder Pain    left shoulder  . wants discuss sleep apnea    Sean Bowers is a 64 y.o.  male for evaluation and treatment of L shoulder pain.   Onset:  6 months ago. No inj or change in activity. 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 Associated symptoms: slightly decreased ROM 2/2 pain, no redness, bruising or swelling Treatment: to date has been [employer].   Neurovascular symptoms: no  Hx of OSA. Requesting to see sleep team as well.   Past Medical History:  Diagnosis Date  . Allergy   . Arthritis   . Asthma    no inhalers  . Diverticulitis 09/2016   seen in Urgent Care  . Genital warts   . History of chicken pox   . History of hay fever   . Hyperlipidemia 09/27/2016  . Hypertension     Objective: VITAL SIGNS: BP 130/71 (BP Location: Left Arm, Cuff Size: Large)   Pulse 74   Temp 98.5 F (36.9 C) (Temporal)   Resp 12   Ht 5\' 9"  (1.753 m)   Wt 212 lb 9.6 oz (96.4 kg)   SpO2 99%   BMI 31.40 kg/m  Constitutional: Well formed, well developed. No acute distress. Cardiovascular: Brisk cap refill Thorax & Lungs: No accessory muscle use Musculoskeletal: L shoulder.   Normal active range of motion: yes.   Normal passive range of motion: yes Tenderness to palpation: mild ttp over coracoid Deformity: no Ecchymosis: no Tests positive: Speed's Tests negative: Neer's, Hawkins, empty can, lift off, cross over Neurologic: Normal sensory function. No focal deficits noted. DTR's equal and symmetric in UE's. No clonus. Psychiatric: Normal mood. Age appropriate judgment and insight. Alert & oriented x 3.    Assessment:  Chronic left shoulder pain  History of sleep apnea - Plan: Ambulatory referral to  Pulmonology  Plan: Stretches/exercises, heat, ice, Tylenol. Refer to pulm.  F/u in 3 mo for CPE. The patient voiced understanding and agreement to the plan.   [place], DO 02/28/20  9:19 AM

## 2020-02-28 NOTE — Patient Instructions (Addendum)
Ice/cold pack over area for 10-15 min twice daily.  Heat (pad or rice pillow in microwave) over affected area, 10-15 minutes twice daily.   OK to take Tylenol 1000 mg (2 extra strength tabs) or 975 mg (3 regular strength tabs) every 6 hours as needed.  Send me a message in 3-4 weeks if we are not turning the corner.   Biceps Tendon Disruption (Proximal) Rehab Do exercises exactly as told by your health care provider and adjust them as directed. It is normal to feel mild stretching, pulling, tightness, or discomfort as you do these exercises, but you should stop right away if you feel sudden pain or your pain gets worse. Stretching and range of motion exercises These exercises warm up your muscles and joints and improve the movement and flexibility of your arm and shoulder. These exercises also help to relieve pain and stiffness. Exercise A: Shoulder flexion, standing   1. Stand facing a wall. Put your left / right hand on the wall. 2. Slide your left / right hand up the wall. Stop when you feel a stretch in your shoulder, or when you reach the angle recommended by your health care provider.  Use your other hand to help raise your arm, if needed.  As your hand gets higher, you may need to step closer to the wall.  Avoid shrugging your shoulder while you raise your arm. To do this, keep your shoulder blade tucked down toward your spine. 3. Hold for 30 seconds. 4. Slowly return to the starting position. Use your other arm to help, if needed. Repeat 2 times. Complete this exercise 3 times per week. Exercise B: Pendulum   1. Stand near a wall or a surface that you can hold onto for balance. 2. Bend at the waist and let your left / right arm hang straight down. Use your other arm to support you. 3. Relax your arm and shoulder muscles, and move your hips and your trunk so your left / right arm swings freely. Your arm should swing because of the motion of your body, not because you are using  your arm or shoulder muscles. 4. Keep moving so your arm swings in the following directions, as told by your health care provider:  Side to side.  Forward and backward.  In clockwise and counterclockwise circles. 5. Slowly return to the starting position. Repeat 2 times. Complete this exercise 3 times per week.  Strengthening exercises These exercises build strength and endurance in your arm and shoulder. Endurance is the ability to use your muscles for a long time, even after your muscles get tired. Exercise C: Elbow flexion, neutral  1. Sit on a stable chair without armrests, or stand. 2. Hold a 3-5 lb weight in your left / right hand, or hold an exercise band with both hands. Your palms should face each other at the starting position. 3. Bend your left / right elbow and move your hand up toward your shoulder.  Lead with your thumb, and keep your palm facing the same direction.  Keep your other arm straight down, in the starting position. 4. Slowly return to the starting position. Repeat 2-3 times. Complete this exercise 3 times per week. Exercise D: Forearm supination   1. Sit with your left / right forearm on a table. Your elbow should be below shoulder height. Rest your hand over the edge of the table so your palm faces down. 2. If directed, hold a hammer with your left / right  hand. 3. Without moving your elbow, slowly rotate your hand so your palm faces up toward the ceiling.  If you are holding a hammer, begin by holding the hammer near the head. When this exercise gets easier for you, hold the hammer farther down the handle. 4. Hold for 3 seconds. 5. Slowly return to the starting position. Repeat 2 times. Complete this exercise 3 times per week. Exercise E: Scapular retraction   1. Sit in a stable chair without armrests, or stand. 2. Secure an exercise band to a stable object in front of you so the band is at shoulder height. 3. Hold one end of the exercise band in each  hand. 4. Squeeze your shoulder blades together and move your elbows slightly behind you. Do not shrug your shoulders. 5. Hold for 3 seconds. 6. Slowly return to the starting position. Repeat 2 times. Complete this exercise 3 times per week. Exercise F: Scapular protraction, supine   1. Lie on your back on a firm surface. Hold a 3-5 lb weight in your left / right hand. 2. Raise your left / right arm straight into the air so your hand is directly above your shoulder joint. 3. Push the weight into the air so your shoulder lifts off of the surface that you are lying on. Do not move your head, neck, or back. 4. Hold for 3 seconds. 5. Slowly return to the starting position. Let your muscles relax completely before you repeat this exercise. Repeat 2 times. Complete this exercise 3 times per week. This information is not intended to replace advice given to you by your health care provider. Make sure you discuss any questions you have with your health care provider. Document Released: 11/04/2005 Document Revised: 07/11/2016 Document Reviewed: 10/13/2015 Elsevier Interactive Patient Education  2017 [employer].

## 2020-03-02 DIAGNOSIS — N400 Enlarged prostate without lower urinary tract symptoms: Secondary | ICD-10-CM | POA: Diagnosis not present

## 2020-03-02 DIAGNOSIS — C61 Malignant neoplasm of prostate: Secondary | ICD-10-CM | POA: Diagnosis not present

## 2020-03-23 ENCOUNTER — Other Ambulatory Visit: Payer: Self-pay

## 2020-03-23 ENCOUNTER — Ambulatory Visit: Payer: BC Managed Care – PPO | Admitting: Pulmonary Disease

## 2020-03-23 ENCOUNTER — Encounter: Payer: Self-pay | Admitting: Pulmonary Disease

## 2020-03-23 VITALS — BP 124/72 | HR 71 | Temp 97.6°F | Ht 68.0 in | Wt 215.4 lb

## 2020-03-23 DIAGNOSIS — G4733 Obstructive sleep apnea (adult) (pediatric): Secondary | ICD-10-CM

## 2020-03-23 DIAGNOSIS — R0683 Snoring: Secondary | ICD-10-CM

## 2020-03-23 NOTE — Patient Instructions (Signed)
High probability of significant obstructive sleep apnea  We will schedule you for home sleep study Update you with results  CPAP treatment will be initiated  We will see you back in about 3 months  Call with significant concerns Sleep Apnea Sleep apnea is a condition in which breathing pauses or becomes shallow during sleep. Episodes of sleep apnea usually last 10 seconds or longer, and they may occur as many as 20 times an hour. Sleep apnea disrupts your sleep and keeps your body from getting the rest that it needs. This condition can increase your risk of certain health problems, including:  Heart attack.  Stroke.  Obesity.  Diabetes.  Heart failure.  Irregular heartbeat. What are the causes? There are three kinds of sleep apnea:  Obstructive sleep apnea. This kind is caused by a blocked or collapsed airway.  Central sleep apnea. This kind happens when the part of the brain that controls breathing does not send the correct signals to the muscles that control breathing.  Mixed sleep apnea. This is a combination of obstructive and central sleep apnea. The most common cause of this condition is a collapsed or blocked airway. An airway can collapse or become blocked if:  Your throat muscles are abnormally relaxed.  Your tongue and tonsils are larger than normal.  You are overweight.  Your airway is smaller than normal. What increases the risk? You are more likely to develop this condition if you:  Are overweight.  Smoke.  Have a smaller than normal airway.  Are elderly.  Are male.  Drink alcohol.  Take sedatives or tranquilizers.  Have a family history of sleep apnea. What are the signs or symptoms? Symptoms of this condition include:  Trouble staying asleep.  Daytime sleepiness and tiredness.  Irritability.  Loud snoring.  Morning headaches.  Trouble concentrating.  Forgetfulness.  Decreased interest in sex.  Unexplained sleepiness.  Mood  swings.  Personality changes.  Feelings of depression.  Waking up often during the night to urinate.  Dry mouth.  Sore throat. How is this diagnosed? This condition may be diagnosed with:  A medical history.  A physical exam.  A series of tests that are done while you are sleeping (sleep study). These tests are usually done in a sleep lab, but they may also be done at home. How is this treated? Treatment for this condition aims to restore normal breathing and to ease symptoms during sleep. It may involve managing health issues that can affect breathing, such as high blood pressure or obesity. Treatment may include:  Sleeping on your side.  Using a decongestant if you have nasal congestion.  Avoiding the use of depressants, including alcohol, sedatives, and narcotics.  Losing weight if you are overweight.  Making changes to your diet.  Quitting smoking.  Using a device to open your airway while you sleep, such as: ? An oral appliance. This is a custom-made mouthpiece that shifts your lower jaw forward. ? A continuous positive airway pressure (CPAP) device. This device blows air through a mask when you breathe out (exhale). ? A nasal expiratory positive airway pressure (EPAP) device. This device has valves that you put into each nostril. ? A bi-level positive airway pressure (BPAP) device. This device blows air through a mask when you breathe in (inhale) and breathe out (exhale).  Having surgery if other treatments do not work. During surgery, excess tissue is removed to create a wider airway. It is important to get treatment for sleep apnea. Without  treatment, this condition can lead to:  High blood pressure.  Coronary artery disease.  In men, an inability to achieve or maintain an erection (impotence).  Reduced thinking abilities. Follow these instructions at home: Lifestyle  Make any lifestyle changes that your health care provider recommends.  Eat a healthy,  well-balanced diet.  Take steps to lose weight if you are overweight.  Avoid using depressants, including alcohol, sedatives, and narcotics.  Do not use any products that contain nicotine or tobacco, such as cigarettes, e-cigarettes, and chewing tobacco. If you need help quitting, ask your health care provider. General instructions  Take over-the-counter and prescription medicines only as told by your health care provider.  If you were given a device to open your airway while you sleep, use it only as told by your health care provider.  If you are having surgery, make sure to tell your health care provider you have sleep apnea. You may need to bring your device with you.  Keep all follow-up visits as told by your health care provider. This is important. Contact a health care provider if:  The device that you received to open your airway during sleep is uncomfortable or does not seem to be working.  Your symptoms do not improve.  Your symptoms get worse. Get help right away if:  You develop: ? Chest pain. ? Shortness of breath. ? Discomfort in your back, arms, or stomach.  You have: ? Trouble speaking. ? Weakness on one side of your body. ? Drooping in your face. These symptoms may represent a serious problem that is an emergency. Do not wait to see if the symptoms will go away. Get medical help right away. Call your local emergency services (911 in the [place] Do not drive yourself to the hospital. Summary  Sleep apnea is a condition in which breathing pauses or becomes shallow during sleep.  The most common cause is a collapsed or blocked airway.  The goal of treatment is to restore normal breathing and to ease symptoms during sleep. This information is not intended to replace advice given to you by your health care provider. Make sure you discuss any questions you have with your health care provider. Document Revised: 04/21/2019 Document Reviewed: 06/30/2018 Elsevier  Patient Education  [place].

## 2020-03-23 NOTE — Addendum Note (Signed)
Addended by: Tery Sanfilippo R on: 03/23/2020 04:17 PM   Modules accepted: Orders

## 2020-03-23 NOTE — Progress Notes (Signed)
Sean Bowers    [id number]    1956-07-26  Primary Care Physician:Wendling, Crosby Oyster, DO  Referring Physician: Shelda Pal, [address] [place],  [place] [address]  Chief complaint:   Patient was diagnosed with sleep apnea in the past In for recurrence of symptoms  HPI:  Was diagnosed with obstructive sleep apnea many years ago Use CPAP about 6 to 8 years Has not used any interface for about 6 years now  Snoring, apneas Jerking movement during sleep Denies dryness No headaches  Not sleepy during the day  Does not usually take daytime naps  Never smoker  Usually tries to get about 6 to 6-1/2 hours of sleep and this is usually adequate for him  Usually goes to bed about 10-11 p.m. Falls asleep in about 10 to 15 minutes  2-3 awakenings Final wake up time 5:45 AM  Weight has remained relatively stable   Outpatient Encounter Medications as of 03/23/2020  Medication Sig  . aspirin 81 MG tablet Take 81 mg by mouth daily.  Marland Kitchen lisinopril-hydrochlorothiazide (ZESTORETIC) 20-25 MG tablet Take 1 tablet by mouth once daily  . loratadine (CLARITIN) 10 MG tablet Take 10 mg by mouth daily.  . Multiple Vitamins-Minerals (MULTIVITAMIN MEN PO) Take by mouth.  . nitroGLYCERIN (NITROSTAT) 0.4 MG SL tablet Place 1 tablet (0.4 mg total) under the tongue every 5 (five) minutes x 3 doses as needed for chest pain.   No facility-administered encounter medications on file as of 03/23/2020.    Allergies as of 03/23/2020 - Review Complete 03/23/2020  Allergen Reaction Noted  . Penicillins Swelling 09/26/2016    Past Medical History:  Diagnosis Date  . Allergy   . Arthritis   . Asthma    no inhalers  . Diverticulitis 09/2016   seen in Urgent Care  . Genital warts   . History of chicken pox   . History of hay fever   . Hyperlipidemia 09/27/2016  . Hypertension     Past Surgical History:  Procedure Laterality Date  . CARPAL TUNNEL  RELEASE Bilateral 2006-2007    Family History  Problem Relation Age of Onset  . Diabetes Mother   . Heart disease Mother   . Hypertension Mother   . Breast cancer Mother   . Arthritis Mother   . Cancer Mother        breast  . Hyperlipidemia Mother   . Heart disease Father   . Stroke Father   . Arthritis Father   . Cancer Father        prostate  . Hyperlipidemia Father   . Heart disease Paternal Grandmother   . Cancer Maternal Uncle        lung  . Cancer Paternal Aunt        lung  . Cancer Paternal Uncle        lung    Social History   Socioeconomic History  . Marital status: Married    Spouse name: Not on file  . Number of children: Not on file  . Years of education: Not on file  . Highest education level: Not on file  Occupational History  . Not on file  Tobacco Use  . Smoking status: Never Smoker  . Smokeless tobacco: Never Used  Substance and Sexual Activity  . Alcohol use: No  . Drug use: No  . Sexual activity: Not on file  Other Topics Concern  . Not on  file  Social History Narrative  . Not on file   Social Determinants of Health   Financial Resource Strain:   . Difficulty of Paying Living Expenses:   Food Insecurity:   . Worried About Charity fundraiser in the Last Year:   . Arboriculturist in the Last Year:   Transportation Needs:   . Film/video editor (Medical):   Marland Kitchen Lack of Transportation (Non-Medical):   Physical Activity:   . Days of Exercise per Week:   . Minutes of Exercise per Session:   Stress:   . Feeling of Stress :   Social Connections:   . Frequency of Communication with Friends and Family:   . Frequency of Social Gatherings with Friends and Family:   . Attends Religious Services:   . Active Member of Clubs or Organizations:   . Attends Archivist Meetings:   Marland Kitchen Marital Status:   Intimate Partner Violence:   . Fear of Current or Ex-Partner:   . Emotionally Abused:   Marland Kitchen Physically Abused:   . Sexually Abused:       Review of Systems  Respiratory: Positive for apnea.   Psychiatric/Behavioral: Positive for sleep disturbance.    Vitals:   03/23/20 1539  BP: 124/72  Pulse: 71  Temp: 97.6 F (36.4 C)  SpO2: 97%     Physical Exam  Constitutional: He appears well-developed.  Obese  HENT:  Head: Normocephalic and atraumatic.  Mallampati 4, crowded oropharynx  Eyes: Pupils are equal, round, and reactive to light.  Cardiovascular: Normal rate and regular rhythm.  Pulmonary/Chest: Effort normal and breath sounds normal. No respiratory distress. He has no wheezes. He has no rales. He exhibits no tenderness.  Musculoskeletal:     Cervical back: Normal range of motion and neck supple.    Results of the Epworth flowsheet 03/23/2020  Sitting and reading 1  Watching TV 1  Sitting, inactive in a public place (e.g. a theatre or a meeting) 0  As a passenger in a car for an hour without a break 1  Lying down to rest in the afternoon when circumstances permit 3  Sitting and talking to someone 0  Sitting quietly after a lunch without alcohol 0  In a car, while stopped for a few minutes in traffic 0  Total score 6   Assessment:  High probability of significant obstructive sleep apnea  Obesity  Pathophysiology of sleep disordered breathing discussed Treatment options discussed  Plan/Recommendations: Risk of not treating sleep disordered breathing discussed  We will schedule a home sleep study  CPAP therapy  Optimize weight management  Adequate hours of sleep   Sherrilyn Rist MD [facility] 03/23/2020, 3:54 PM  CC: Shelda Pal*

## 2020-03-24 ENCOUNTER — Other Ambulatory Visit: Payer: Self-pay | Admitting: Family Medicine

## 2020-04-04 DIAGNOSIS — H43392 Other vitreous opacities, left eye: Secondary | ICD-10-CM | POA: Diagnosis not present

## 2020-04-04 DIAGNOSIS — H5319 Other subjective visual disturbances: Secondary | ICD-10-CM | POA: Diagnosis not present

## 2020-04-04 LAB — HM DIABETES EYE EXAM

## 2020-04-10 ENCOUNTER — Encounter: Payer: Self-pay | Admitting: Family Medicine

## 2020-05-24 ENCOUNTER — Other Ambulatory Visit: Payer: Self-pay

## 2020-05-24 ENCOUNTER — Ambulatory Visit: Payer: BC Managed Care – PPO

## 2020-05-24 DIAGNOSIS — G4733 Obstructive sleep apnea (adult) (pediatric): Secondary | ICD-10-CM

## 2020-05-26 IMAGING — MR MR PROSTATE WO/W CM
56 series · 56 of 56 positions shown · IV contrast (multihance)
Comparison: None.

CLINICAL DATA: Prostate carcinoma, Gleason score 3+3=6.

Creatinine was obtained on site at [HOSPITAL] at [HOSPITAL].
Results: Creatinine 0.8 mg/dL.
EXAM:
MR PROSTATE WITHOUT AND WITH CONTRAST
TECHNIQUE: Multiplanar multisequence MRI images were obtained of the pelvis
centered about the prostate. Pre and post contrast images were
obtained.
CONTRAST:  20mL MULTIHANCE GADOBENATE DIMEGLUMINE 529 MG/ML IV SOLN

[Series 3: T1 · axial · 8.0mm · 1.06mm/px · 1 of 28 slices shown (1 of 2)]
[im 1/28]
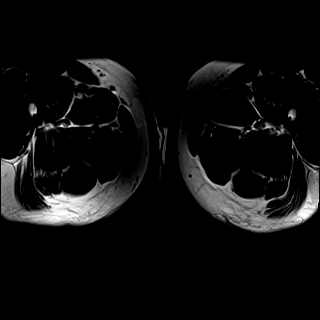

[Series 4: bSSFP fat-sat · axial · 8.0mm · 0.74mm/px · 1 of 28 slices shown]
[im 1/28]
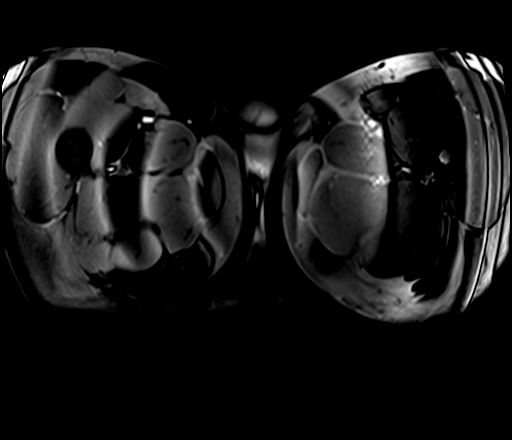

[Series 5: T2 · sagittal · 3.5mm · 0.56mm/px · 1 of 39 slices shown (1 of 4)]
[im 1/39]
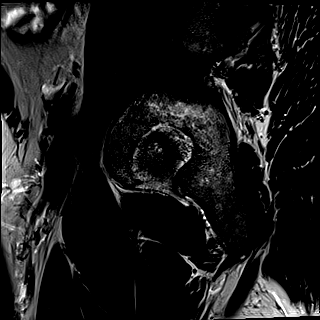

[Series 6: T1 · axial · 3.0mm · 0.31mm/px · 1 of 30 slices shown (2 of 2)]
[im 1/30]
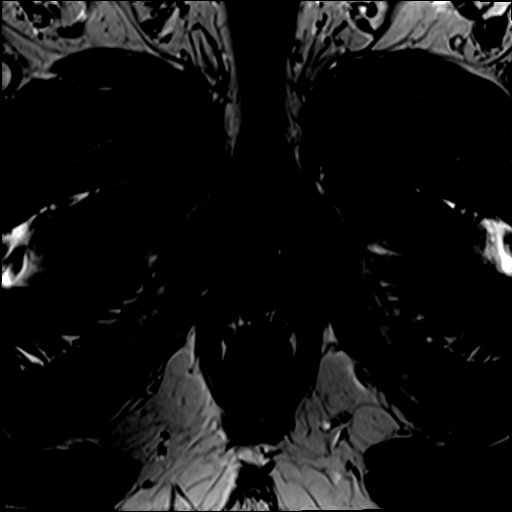

[Series 7: T2 · axial · 3.5mm · 0.56mm/px · 1 of 30 slices shown (2 of 4)]
[im 1/30]
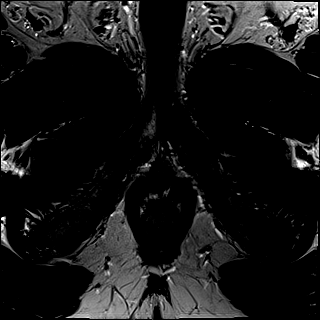

[Series 8: T2 · axial · 1.0mm · 1.04mm/px · 1 of 104 slices shown (3 of 4)]
[im 1/104]
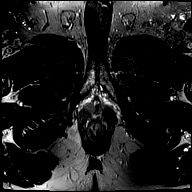

[Series 9: T2 · coronal · 3.5mm · 0.56mm/px · 1 of 23 slices shown (4 of 4)]
[im 1/23]
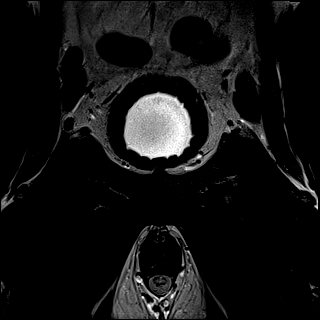

[Series 10: DWI · axial · 3.5mm · 1.56mm/px · 1 of 84 slices shown (1 of 2)]
[im 1/84]
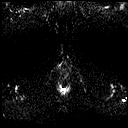

[Series 11: DWI · axial · 3.5mm · 1.56mm/px · 1 of 28 slices shown (2 of 2)]
[im 1/28]
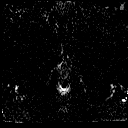

[Series 12: pre t1_twist_tra_dyn_ttc=6.4s · axial · non-contrast · 3.5mm · 0.83mm/px · 1 of 26 slices shown]
[im 1/26]
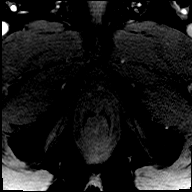

[Series 13: post t1_twist_tra_dyn-copy center · axial · 3.5mm · 0.83mm/px · 1 of 26 slices shown (1 of 24)]
[im 1/26]
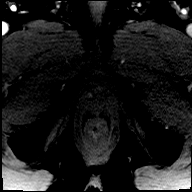

[Series 14: post t1_twist_tra_dyn-copy center · axial · 3.5mm · 0.83mm/px · 1 of 26 slices shown (2 of 24)]
[im 1/26]
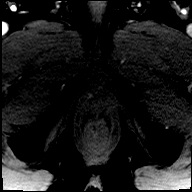

[Series 15: post t1_twist_tra_dyn-copy cent_sub_ttc=(id) · axial · 3.5mm · 0.83mm/px · 1 of 26 slices shown (1 of 22)]
[im 1/26]
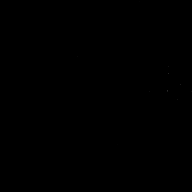

[Series 16: post t1_twist_tra_dyn-copy center · axial · 3.5mm · 0.83mm/px · 1 of 26 slices shown (3 of 24)]
[im 1/26]
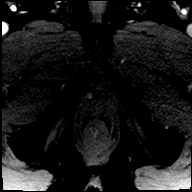

[Series 17: post t1_twist_tra_dyn-copy cent_sub_ttc=(id) · axial · 3.5mm · 0.83mm/px · 1 of 25 slices shown (2 of 22)]
[im 1/25]
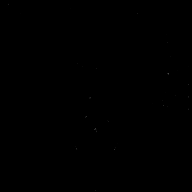

[Series 18: post t1_twist_tra_dyn-copy center · axial · 3.5mm · 0.83mm/px · 1 of 26 slices shown (4 of 24)]
[im 1/26]
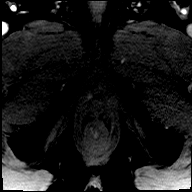

[Series 19: post t1_twist_tra_dyn-copy cent_sub_ttc=(id) · axial · 3.5mm · 0.83mm/px · 1 of 26 slices shown (3 of 22)]
[im 1/26]
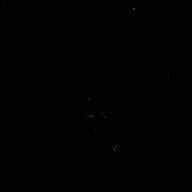

[Series 20: post t1_twist_tra_dyn-copy center · axial · 3.5mm · 0.83mm/px · 1 of 26 slices shown (5 of 24)]
[im 1/26]
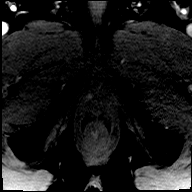

[Series 21: post t1_twist_tra_dyn-copy cent_sub_ttc=(id) · axial · 3.5mm · 0.83mm/px · 1 of 25 slices shown (4 of 22)]
[im 1/25]
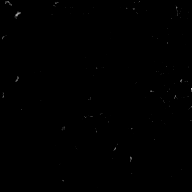

[Series 22: post t1_twist_tra_dyn-copy center · axial · 3.5mm · 0.83mm/px · 1 of 26 slices shown (6 of 24)]
[im 1/26]
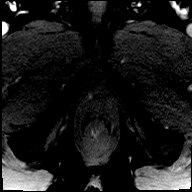

[Series 23: post t1_twist_tra_dyn-copy cent_sub_ttc=(id) · axial · 3.5mm · 0.83mm/px · 1 of 26 slices shown (5 of 22)]
[im 1/26]
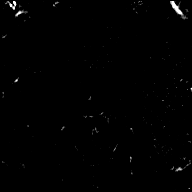

[Series 24: post t1_twist_tra_dyn-copy center · axial · 3.5mm · 0.83mm/px · 1 of 26 slices shown (7 of 24)]
[im 1/26]
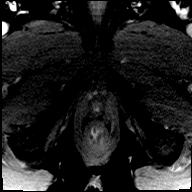

[Series 25: post t1_twist_tra_dyn-copy cent_sub_ttc=(id) · axial · 3.5mm · 0.83mm/px · 1 of 26 slices shown (6 of 22)]
[im 1/26]
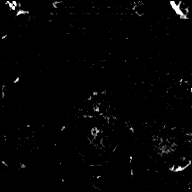

[Series 26: post t1_twist_tra_dyn-copy center · axial · 3.5mm · 0.83mm/px · 1 of 26 slices shown (8 of 24)]
[im 1/26]
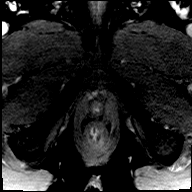

[Series 27: post t1_twist_tra_dyn-copy cent_sub_ttc=(id) · axial · 3.5mm · 0.83mm/px · 1 of 26 slices shown (7 of 22)]
[im 1/26]
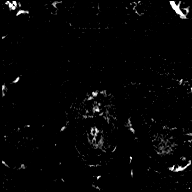

[Series 28: post t1_twist_tra_dyn-copy center · axial · 3.5mm · 0.83mm/px · 1 of 26 slices shown (9 of 24)]
[im 1/26]
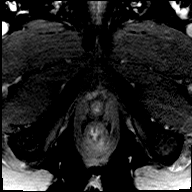

[Series 29: post t1_twist_tra_dyn-copy cent_sub_ttc=(id) · axial · 3.5mm · 0.83mm/px · 1 of 26 slices shown (8 of 22)]
[im 1/26]
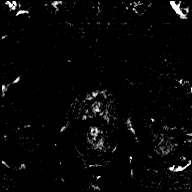

[Series 30: post t1_twist_tra_dyn-copy center · axial · 3.5mm · 0.83mm/px · 1 of 26 slices shown (10 of 24)]
[im 1/26]
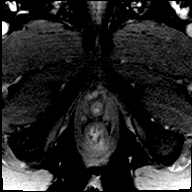

[Series 31: post t1_twist_tra_dyn-copy cent_sub_ttc=(id) · axial · 3.5mm · 0.83mm/px · 1 of 26 slices shown (9 of 22)]
[im 1/26]
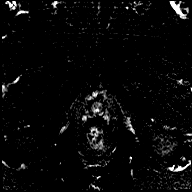

[Series 32: post t1_twist_tra_dyn-copy center · axial · 3.5mm · 0.83mm/px · 1 of 26 slices shown (11 of 24)]
[im 1/26]
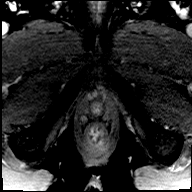

[Series 33: post t1_twist_tra_dyn-copy cent_sub_ttc=(id) · axial · 3.5mm · 0.83mm/px · 1 of 26 slices shown (10 of 22)]
[im 1/26]
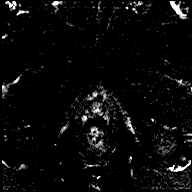

[Series 34: post t1_twist_tra_dyn-copy center · axial · 3.5mm · 0.83mm/px · 1 of 26 slices shown (12 of 24)]
[im 1/26]
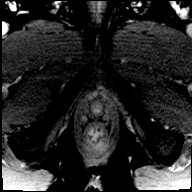

[Series 35: post t1_twist_tra_dyn-copy cent_sub_ttc=(id) · axial · 3.5mm · 0.83mm/px · 1 of 26 slices shown (11 of 22)]
[im 1/26]
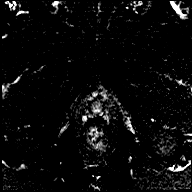

[Series 36: post t1_twist_tra_dyn-copy center · axial · 3.5mm · 0.83mm/px · 1 of 26 slices shown (13 of 24)]
[im 1/26]
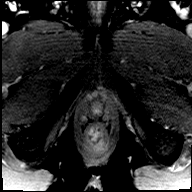

[Series 37: post t1_twist_tra_dyn-copy cent_sub_ttc=(id) · axial · 3.5mm · 0.83mm/px · 1 of 26 slices shown (12 of 22)]
[im 1/26]
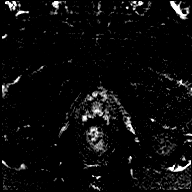

[Series 38: post t1_twist_tra_dyn-copy center · axial · 3.5mm · 0.83mm/px · 1 of 26 slices shown (14 of 24)]
[im 1/26]
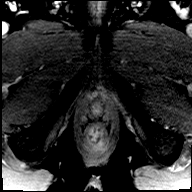

[Series 39: post t1_twist_tra_dyn-copy cent_sub_ttc=(id) · axial · 3.5mm · 0.83mm/px · 1 of 26 slices shown (13 of 22)]
[im 1/26]
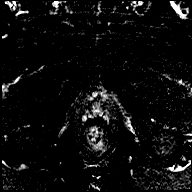

[Series 40: post t1_twist_tra_dyn-copy center · axial · 3.5mm · 0.83mm/px · 1 of 26 slices shown (15 of 24)]
[im 1/26]
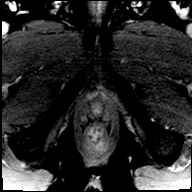

[Series 41: post t1_twist_tra_dyn-copy cent_sub_ttc=(id) · axial · 3.5mm · 0.83mm/px · 1 of 26 slices shown (14 of 22)]
[im 1/26]
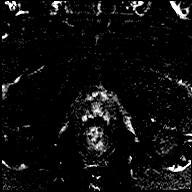

[Series 42: post t1_twist_tra_dyn-copy center · axial · 3.5mm · 0.83mm/px · 1 of 26 slices shown (16 of 24)]
[im 1/26]
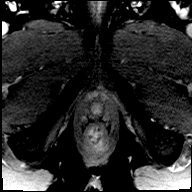

[Series 43: post t1_twist_tra_dyn-copy cent_sub_ttc=(id) · axial · 3.5mm · 0.83mm/px · 1 of 26 slices shown (15 of 22)]
[im 1/26]
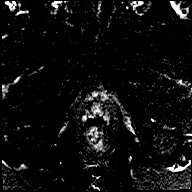

[Series 44: post t1_twist_tra_dyn-copy center · axial · 3.5mm · 0.83mm/px · 1 of 26 slices shown (17 of 24)]
[im 1/26]
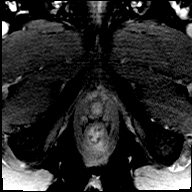

[Series 45: post t1_twist_tra_dyn-copy cent_sub_ttc=(id) · axial · 3.5mm · 0.83mm/px · 1 of 26 slices shown (16 of 22)]
[im 1/26]
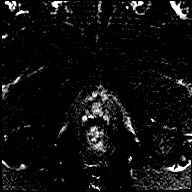

[Series 46: post t1_twist_tra_dyn-copy center · axial · 3.5mm · 0.83mm/px · 1 of 26 slices shown (18 of 24)]
[im 1/26]
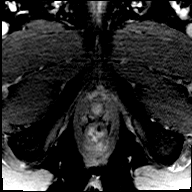

[Series 47: post t1_twist_tra_dyn-copy cent_sub_ttc=(id) · axial · 3.5mm · 0.83mm/px · 1 of 26 slices shown (17 of 22)]
[im 1/26]
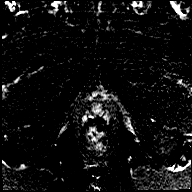

[Series 48: post t1_twist_tra_dyn-copy center · axial · 3.5mm · 0.83mm/px · 1 of 26 slices shown (19 of 24)]
[im 1/26]
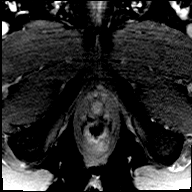

[Series 49: post t1_twist_tra_dyn-copy cent_sub_ttc=(id) · axial · 3.5mm · 0.83mm/px · 1 of 26 slices shown (18 of 22)]
[im 1/26]
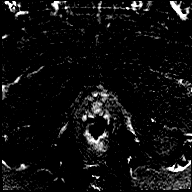

[Series 50: post t1_twist_tra_dyn-copy center · axial · 3.5mm · 0.83mm/px · 1 of 26 slices shown (20 of 24)]
[im 1/26]
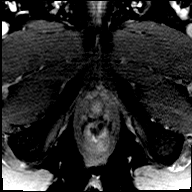

[Series 51: post t1_twist_tra_dyn-copy cent_sub_ttc=(id) · axial · 3.5mm · 0.83mm/px · 1 of 26 slices shown (19 of 22)]
[im 1/26]
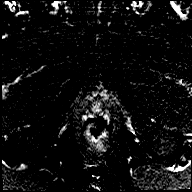

[Series 52: post t1_twist_tra_dyn-copy center · axial · 3.5mm · 0.83mm/px · 1 of 26 slices shown (21 of 24)]
[im 1/26]
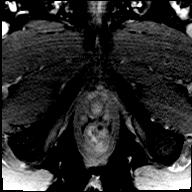

[Series 53: post t1_twist_tra_dyn-copy cent_sub_ttc=(id) · axial · 3.5mm · 0.83mm/px · 1 of 26 slices shown (20 of 22)]
[im 1/26]
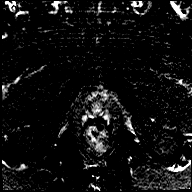

[Series 54: post t1_twist_tra_dyn-copy center · axial · 3.5mm · 0.83mm/px · 1 of 26 slices shown (22 of 24)]
[im 1/26]
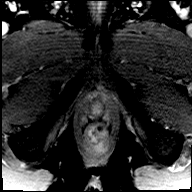

[Series 55: post t1_twist_tra_dyn-copy cent_sub_ttc=(id) · axial · 3.5mm · 0.83mm/px · 1 of 26 slices shown (21 of 22)]
[im 1/26]
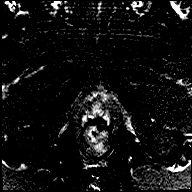

[Series 56: post t1_twist_tra_dyn-copy center · axial · 3.5mm · 0.83mm/px · 1 of 26 slices shown (23 of 24)]
[im 1/26]
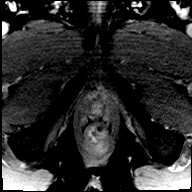

[Series 57: post t1_twist_tra_dyn-copy cent_sub_ttc=(id) · axial · 3.5mm · 0.83mm/px · 1 of 26 slices shown (22 of 22)]
[im 1/26]
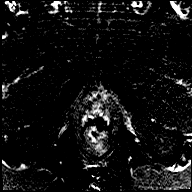

[Series 58: post t1_twist_tra_dyn-copy center · axial · 3.5mm · 0.83mm/px · 1 of 26 slices shown (24 of 24)]
[im 1/26]
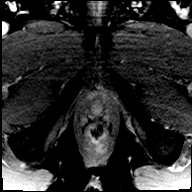

[56 of 56 positions shown; findings below may reference images not displayed]

FINDINGS: Prostate:

-- Peripheral Zone: Linear/wedge shaped hypointensity is seen on
ADC, however no focal nodules with ADC hypointensity or high b-value
DWI hyperintensity identified.

-- Transition/Central Zone: Circumscribed BPH nodules noted, but no
suspicious nodules with obscured or non-circumscribed margins seen.

-- Measurements/Volume:  7.5 x 5.6 x 6.0 cm (volume = 130 cm^3)

Transcapsular spread:  Absent

Seminal vesicle involvement:  Absent

Neurovascular bundle involvement:  Absent

Pelvic adenopathy: None visualized

Bone metastasis: None visualized

Other: Moderate diffuse wall thickening of urinary bladder,
consistent with chronic bladder outlet obstruction. Sigmoid
diverticulosis, without evidence of diverticulitis.
IMPRESSION: No radiographic evidence of high-grade prostate carcinoma. PI-RADS
2: Low (clinically significant cancer is unlikely to be present)

## 2020-05-29 ENCOUNTER — Encounter: Payer: BC Managed Care – PPO | Admitting: Family Medicine

## 2020-05-31 ENCOUNTER — Other Ambulatory Visit: Payer: Self-pay

## 2020-05-31 DIAGNOSIS — G4733 Obstructive sleep apnea (adult) (pediatric): Secondary | ICD-10-CM | POA: Diagnosis not present

## 2020-06-05 ENCOUNTER — Telehealth (HOSPITAL_COMMUNITY): Payer: Self-pay | Admitting: Pulmonary Disease

## 2020-06-05 DIAGNOSIS — G4733 Obstructive sleep apnea (adult) (pediatric): Secondary | ICD-10-CM | POA: Diagnosis not present

## 2020-06-05 NOTE — Telephone Encounter (Signed)
Call patient  Sleep study result  Date of study: 05/31/2020  Impression: Mild obstructive sleep apnea  Recommendation: Options of treatment will include CPAP therapy in the context of daytime symptoms  CPAP therapy with auto titrating CPAP with settings of 5-15 will be appropriate  An oral device may also be considered as an option of treatment for mild sleep disordered breathing  Aggressive weight loss measures  Follow-up as previously scheduled

## 2020-06-07 NOTE — Telephone Encounter (Signed)
Patient driving, requesting we call back on 06/08/2020.

## 2020-06-09 NOTE — Telephone Encounter (Signed)
Patient contacted with results of home sleep study. Patient is going to think about options. Please call on 06/12/2020

## 2020-06-18 ENCOUNTER — Other Ambulatory Visit: Payer: Self-pay | Admitting: Family Medicine

## 2020-06-28 ENCOUNTER — Ambulatory Visit (INDEPENDENT_AMBULATORY_CARE_PROVIDER_SITE_OTHER): Payer: BC Managed Care – PPO | Admitting: Family Medicine

## 2020-06-28 ENCOUNTER — Other Ambulatory Visit (INDEPENDENT_AMBULATORY_CARE_PROVIDER_SITE_OTHER): Payer: BC Managed Care – PPO

## 2020-06-28 ENCOUNTER — Other Ambulatory Visit: Payer: Self-pay

## 2020-06-28 ENCOUNTER — Other Ambulatory Visit: Payer: Self-pay | Admitting: Family Medicine

## 2020-06-28 ENCOUNTER — Encounter: Payer: Self-pay | Admitting: Family Medicine

## 2020-06-28 VITALS — BP 130/78 | HR 86 | Temp 98.4°F | Ht 68.0 in | Wt 211.5 lb

## 2020-06-28 DIAGNOSIS — R739 Hyperglycemia, unspecified: Secondary | ICD-10-CM | POA: Diagnosis not present

## 2020-06-28 DIAGNOSIS — Z0000 Encounter for general adult medical examination without abnormal findings: Secondary | ICD-10-CM | POA: Diagnosis not present

## 2020-06-28 DIAGNOSIS — E876 Hypokalemia: Secondary | ICD-10-CM

## 2020-06-28 LAB — COMPREHENSIVE METABOLIC PANEL
ALT: 26 U/L (ref 0–53)
AST: 26 U/L (ref 0–37)
Albumin: 4.3 g/dL (ref 3.5–5.2)
Alkaline Phosphatase: 40 U/L (ref 39–117)
BUN: 16 mg/dL (ref 6–23)
CO2: 25 mEq/L (ref 19–32)
Calcium: 9.5 mg/dL (ref 8.4–10.5)
Chloride: 102 mEq/L (ref 96–112)
Creatinine, Ser: 0.93 mg/dL (ref 0.40–1.50)
GFR: 82.01 mL/min (ref >60.00)
Glucose, Bld: 133 mg/dL — ABNORMAL HIGH (ref 70–99)
Potassium: 3.4 mEq/L — ABNORMAL LOW (ref 3.5–5.1)
Sodium: 139 mEq/L (ref 135–145)
Total Bilirubin: 1.3 mg/dL — ABNORMAL HIGH (ref 0.2–1.2)
Total Protein: 7 g/dL (ref 6.0–8.3)

## 2020-06-28 LAB — CBC
HCT: 42.4 % (ref 39.0–52.0)
Hemoglobin: 14.8 g/dL (ref 13.0–17.0)
MCHC: 35 g/dL (ref 30.0–36.0)
MCV: 87.1 fl (ref 78.0–100.0)
Platelets: 196 10*3/uL (ref 150.0–400.0)
RBC: 4.87 Mil/uL (ref 4.22–5.81)
RDW: 12.7 % (ref 11.5–15.5)
WBC: 6.3 10*3/uL (ref 4.0–10.5)

## 2020-06-28 LAB — LIPID PANEL
Cholesterol: 186 mg/dL (ref 0–200)
HDL: 46.6 mg/dL (ref >39.00)
LDL Cholesterol: 113 mg/dL — ABNORMAL HIGH (ref 0–99)
NonHDL: 139.87
Total CHOL/HDL Ratio: 4
Triglycerides: 136 mg/dL (ref 0.0–149.0)
VLDL: 27.2 mg/dL (ref 0.0–40.0)

## 2020-06-28 NOTE — Progress Notes (Signed)
Chief Complaint  Patient presents with  . Annual Exam    Well Male Sean Bowers is here for a complete physical.   His last physical was >1 year ago.  Current diet: in general, diet is largely helpful.  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 No. Seat belt? Yes.    Health maintenance Shingrix- No Colonoscopy- Yes Tetanus- Yes HIV- Yes Hep C- Yes   Past Medical History:  Diagnosis Date  . Allergy   . Arthritis   . Asthma    no inhalers  . Diverticulitis 09/2016   seen in Urgent Care  . Genital warts   . History of chicken pox   . History of hay fever   . Hyperlipidemia 09/27/2016  . Hypertension       Past Surgical History:  Procedure Laterality Date  . CARPAL TUNNEL RELEASE Bilateral 2006-2007    Medications  Current Outpatient Medications on File Prior to Visit  Medication Sig Dispense Refill  . aspirin 81 MG tablet Take 81 mg by mouth daily.    Marland Kitchen lisinopril-hydrochlorothiazide (ZESTORETIC) 20-25 MG tablet Take 1 tablet by mouth once daily 90 tablet 0  . loratadine (CLARITIN) 10 MG tablet Take 10 mg by mouth daily.    . Multiple Vitamins-Minerals (MULTIVITAMIN MEN PO) Take by mouth.    . nitroGLYCERIN (NITROSTAT) 0.4 MG SL tablet Place 1 tablet (0.4 mg total) under the tongue every 5 (five) minutes x 3 doses as needed for chest pain. 25 tablet 1    Allergies Allergies  Allergen Reactions  . Penicillins Swelling    Swelling of lips Has patient had a PCN reaction causing immediate rash, facial/tongue/throat swelling, SOB or lightheadedness with hypotension: Yes Has patient had a PCN reaction causing severe rash involving mucus membranes or skin necrosis: Yes Has patient had a PCN reaction that required hospitalization: No Has patient had a PCN reaction occurring within the last 10 years: No If all of the above answers are "NO", then may proceed with Cephalosporin use.      Family History Family History  Problem Relation Age of Onset  . Diabetes Mother   . Heart disease Mother   . Hypertension Mother   . Breast cancer Mother   . Arthritis Mother   . Cancer Mother        breast  . Hyperlipidemia Mother   . Heart disease Father   . Stroke Father   . Arthritis Father   . Cancer Father        prostate  . Hyperlipidemia Father   . Heart disease Paternal Grandmother   . Cancer Maternal Uncle        lung  . Cancer Paternal Aunt        lung  . Cancer Paternal Uncle        lung    Review of Systems: Constitutional:  no fevers Eye:  no recent significant change in vision Ear/Nose/Mouth/Throat:  Ears:  no hearing loss Nose/Mouth/Throat:  no complaints of nasal congestion, no sore throat Cardiovascular:  no chest pain Respiratory:  no shortness of breath Gastrointestinal:  no change in bowel habits GU:  Male: negative for dysuria, frequency Musculoskeletal/Extremities:  no joint pain Integumentary (Skin/Breast):  no abnormal skin lesions reported Neurologic:  no headaches Endocrine: No unexpected weight changes Hematologic/Lymphatic:  no abnormal bleeding  Exam BP 130/78 (BP Location: Left Arm, Patient Position: Sitting, Cuff Size: Normal)   Pulse  86   Temp 98.4 F (36.9 C) (Oral)   Ht 5\' 8"  (1.727 m)   Wt 211 lb 8 oz (95.9 kg)   SpO2 96%   BMI 32.16 kg/m  General:  well developed, well nourished, in no apparent distress Skin:  no significant moles, warts, or growths Head:  no masses, lesions, or tenderness Eyes:  pupils equal and round, sclera anicteric without injection Ears:  canals without lesions, TMs shiny without retraction, no obvious effusion, no erythema Nose:  nares patent, septum midline, mucosa normal Throat/Pharynx:  lips and gingiva without lesion; tongue and uvula midline; non-inflamed pharynx; no exudates or postnasal drainage Neck: neck supple without adenopathy, thyromegaly, or masses Cardiac: RRR, no bruits, no LE  edema Lungs:  clear to auscultation, breath sounds equal bilaterally, no respiratory distress Rectal: Deferred Musculoskeletal:  symmetrical muscle groups noted without atrophy or deformity Neuro:  gait normal; deep tendon reflexes normal and symmetric Psych: well oriented with normal range of affect and appropriate judgment/insight   R upper back  Assessment and Plan  Well adult exam - Plan: CBC, Comprehensive metabolic panel, Lipid panel   Well 64 y.o. male. Counseled on diet and exercise. Pt dx'd w prostate cancer, follows with urology.  Shingrix rec'd.  He will update [place] w his covid vaccination dates. Flu shot in Oct.  Skin lesion appears to be an atypical nevus. Will monitor for now. Advised he take a pic for his own records. Punch bx if needed.  Immunizations, labs, and further orders as above. Follow up in 6 mo. The patient voiced understanding and agreement to the plan.  [place], DO 06/28/20 8:06 AM

## 2020-06-28 NOTE — Patient Instructions (Addendum)
Give [place] 2-3 business days to get the results of your labs back.   Keep the diet clean and stay active.  I recommend getting the flu shot in mid October. This suggestion would change if the CDC comes out with a different recommendation.   The new Shingrix vaccine (for shingles) is a 2 shot series. It can make people feel low energy, achy and almost like they have the flu for 48 hours after injection. Please plan accordingly when deciding on when to get this shot. Call our office for a nurse visit appointment to get this. The second shot of the series is less severe regarding the side effects, but it still lasts 48 hours.   Let us know if you need anything.

## 2020-06-29 LAB — HEMOGLOBIN A1C: Hgb A1c MFr Bld: 5.5 % (ref 4.6–6.5)

## 2020-07-05 ENCOUNTER — Other Ambulatory Visit (INDEPENDENT_AMBULATORY_CARE_PROVIDER_SITE_OTHER): Payer: BC Managed Care – PPO

## 2020-07-05 ENCOUNTER — Other Ambulatory Visit: Payer: Self-pay

## 2020-07-05 DIAGNOSIS — E876 Hypokalemia: Secondary | ICD-10-CM | POA: Diagnosis not present

## 2020-07-05 LAB — BASIC METABOLIC PANEL
BUN: 16 mg/dL (ref 6–23)
CO2: 30 mEq/L (ref 19–32)
Calcium: 9.8 mg/dL (ref 8.4–10.5)
Chloride: 101 mEq/L (ref 96–112)
Creatinine, Ser: 0.95 mg/dL (ref 0.40–1.50)
GFR: 80.01 mL/min (ref >60.00)
Glucose, Bld: 93 mg/dL (ref 70–99)
Potassium: 3.9 mEq/L (ref 3.5–5.1)
Sodium: 139 mEq/L (ref 135–145)

## 2020-08-23 DIAGNOSIS — N400 Enlarged prostate without lower urinary tract symptoms: Secondary | ICD-10-CM | POA: Diagnosis not present

## 2020-10-02 ENCOUNTER — Other Ambulatory Visit: Payer: Self-pay | Admitting: Family Medicine

## 2020-12-05 ENCOUNTER — Ambulatory Visit: Payer: BC Managed Care – PPO

## 2020-12-14 DIAGNOSIS — N400 Enlarged prostate without lower urinary tract symptoms: Secondary | ICD-10-CM | POA: Diagnosis not present

## 2020-12-14 DIAGNOSIS — C61 Malignant neoplasm of prostate: Secondary | ICD-10-CM | POA: Diagnosis not present

## 2021-01-02 ENCOUNTER — Encounter (INDEPENDENT_AMBULATORY_CARE_PROVIDER_SITE_OTHER): Payer: Self-pay

## 2021-01-02 ENCOUNTER — Ambulatory Visit: Payer: BC Managed Care – PPO | Admitting: Family Medicine

## 2021-01-02 ENCOUNTER — Encounter: Payer: Self-pay | Admitting: Family Medicine

## 2021-01-02 ENCOUNTER — Other Ambulatory Visit: Payer: Self-pay

## 2021-01-02 VITALS — BP 120/80 | HR 68 | Temp 98.0°F | Ht 68.0 in | Wt 215.0 lb

## 2021-01-02 DIAGNOSIS — M6289 Other specified disorders of muscle: Secondary | ICD-10-CM

## 2021-01-02 DIAGNOSIS — E7800 Pure hypercholesterolemia, unspecified: Secondary | ICD-10-CM

## 2021-01-02 DIAGNOSIS — I10 Essential (primary) hypertension: Secondary | ICD-10-CM | POA: Diagnosis not present

## 2021-01-02 DIAGNOSIS — M7712 Lateral epicondylitis, left elbow: Secondary | ICD-10-CM | POA: Diagnosis not present

## 2021-01-02 LAB — COMPREHENSIVE METABOLIC PANEL
ALT: 28 U/L (ref 0–53)
AST: 30 U/L (ref 0–37)
Albumin: 4.5 g/dL (ref 3.5–5.2)
Alkaline Phosphatase: 42 U/L (ref 39–117)
BUN: 15 mg/dL (ref 6–23)
CO2: 33 mEq/L — ABNORMAL HIGH (ref 19–32)
Calcium: 10.2 mg/dL (ref 8.4–10.5)
Chloride: 98 mEq/L (ref 96–112)
Creatinine, Ser: 0.9 mg/dL (ref 0.40–1.50)
GFR: 90.75 mL/min (ref >60.00)
Glucose, Bld: 89 mg/dL (ref 70–99)
Potassium: 4.4 mEq/L (ref 3.5–5.1)
Sodium: 138 mEq/L (ref 135–145)
Total Bilirubin: 1 mg/dL (ref 0.2–1.2)
Total Protein: 7.4 g/dL (ref 6.0–8.3)

## 2021-01-02 LAB — LIPID PANEL
Cholesterol: 240 mg/dL — ABNORMAL HIGH (ref 0–200)
HDL: 51 mg/dL (ref >39.00)
LDL Cholesterol: 169 mg/dL — ABNORMAL HIGH (ref 0–99)
NonHDL: 189.43
Total CHOL/HDL Ratio: 5
Triglycerides: 102 mg/dL (ref 0.0–149.0)
VLDL: 20.4 mg/dL (ref 0.0–40.0)

## 2021-01-02 NOTE — Progress Notes (Signed)
Chief Complaint  Patient presents with  . Follow-up    Subjective Sean Bowers is a 65 y.o. male who presents for hypertension follow up. He does not monitor home blood pressures. He is compliant with medications-  Zestoretic 20-25 mg/d. Patient has these side effects of medication: none He is sometimes adhering to a healthy diet overall. Current exercise: active at work, walking  R thigh pain Pain over posterior thigh near butt after he sits for around 1 hr. If he gets up, it gets better. Started a couple years ago, getting worse. Has tried cushions in the car. No inj or change in activity. Denies bruising, decreased ROM, redness, swelling, neuro s/s's.   L elbow pain Going on for several months. No inj or change in activity. Sharp/achy pain. Worse when he lifts things. No redness, swelling, decreased ROM, catching/locking, bruising, neuro s/s's. Has not tried anything at home thus far.    Past Medical History:  Diagnosis Date  . Allergy   . Arthritis   . Asthma    no inhalers  . Diverticulitis 09/2016   seen in Urgent Care  . Genital warts   . History of chicken pox   . History of hay fever   . Hyperlipidemia 09/27/2016  . Hypertension     Exam BP 120/80 (BP Location: Left Arm, Patient Position: Sitting, Cuff Size: Normal)   Pulse 68   Temp 98 F (36.7 C) (Oral)   Ht 5\' 8"  (1.727 m)   Wt 215 lb (97.5 kg)   SpO2 98%   BMI 32.69 kg/m  General:  well developed, well nourished, in no apparent distress Heart: RRR, no bruits, no LE edema Lungs: clear to auscultation, no accessory muscle use MSK:  L elbow: Nml active/passive ROM. No deformity or skin change. +TTP over lateral epicondyle, +pain with resisted wrist extension on L R thigh: Very tight hamstrings b/l, worse on R, no ttp Neuro: Nml gait, DTR's equal and symmetric in LE's.  Psych: well oriented with normal range of affect and appropriate judgment/insight  Essential hypertension  Pure hypercholesterolemia  - Plan: Comprehensive metabolic panel, Lipid panel  Lateral epicondylitis of left elbow  Hamstring tightness  1. Cont Zestoretic 20-25 mg/d. Counseled on diet and exercise. 2. Ck labs. 3. Strap for forearm, stretches/exercises, ice, Tylenol.  4. Stretches/exercises. Very tight. Might need PT.  F/u in 6 mo for CPE or prn.  The patient voiced understanding and agreement to the plan.  [place], DO 01/02/21  8:00 AM

## 2021-01-02 NOTE — Patient Instructions (Addendum)
Keep the diet clean and stay active.  Give [place] 2-3 business days to get the results of your labs back.   Let us know if you need anything.  OK to use Debrox (peroxide) in the ear to loosen up wax. Also recommend using a bulb syringe (for removing boogers from baby's noses) to flush through warm water and vinegar (3-4:1 ratio). An alternative, though more expensive, is an elephant ear washer wax removal kit. Do not use Q-tips as this can impact wax further.  Consider a forearm strap (Band-IT) to help with your elbow. This can give your elbow a break and allow it to heal faster.   Elbow and Forearm Exercises It is normal to feel mild stretching, pulling, tightness, or discomfort as you do these exercises, but you should stop right away if you feel sudden pain or your pain gets worse. RANGE OF MOTION EXERCISES These exercises warm up your muscles and joints and improve the movement and flexibility of your injured elbow and forearm. These exercises also help to relieve pain, numbness, and tingling.These exercises are done using the muscles in your injured elbow and forearm. Exercise A: Elbow Flexion, Active 1. Hold your left / right arm at your side, and bend your elbow as far as you can using your left / right arm muscles. 2. Hold this position for 30 seconds. 3. Slowly return to the starting position. Repeat 2 times. Complete this exercise 3 times per week. Exercise B: Elbow Extension, Active 1. Hold your left / right arm at your side, and straighten your elbow as much as you can using your left / right arm muscles. 2. Hold this position for 30 seconds. 3. Slowly return to the starting position. Repeat 2 times. Complete this exercise 3 times per week. Exercise C: Forearm Rotation, Supination, Active 1. Stand or sit with your elbows at your sides. 2. Bend your left / right elbow to an "L" shape (90 degrees). 3. Turn your palm upward until you feel a gentle stretch on the inside of your  forearm. 4. Hold this position for 30 seconds. 5. Slowly release and return to the starting position. Repeat 2 times. Complete this exercise 3 times per week. Exercise D: Forearm Rotation, Pronation, Active 1. Stand or sit with your elbows at your side. 2. Bend your left / right elbow to an "L" shape (90 degrees). 3. Turn your left / right palm downward until you feel a gentle stretch on the top of your forearm. 4. Hold this position for 30 seconds. 5. Slowly release and return to the starting position. Repeat2 times. Complete this exercise 3 times per week. STRETCHING EXERCISES These exercises warm up your muscles and joints and improve the movement and flexibility of your injured elbow and forearm. These exercises also help to relieve pain, numbness, and tingling.These exercises are done using your healthy elbow and forearm to help stretch the muscles in your injured elbow and forearm. Exercise E: Elbow Flexion, Active-Assisted  1. Hold your left / right arm at your side, and bend your elbow as much as you can using your left / right arm muscles. 2. Use your other hand to bend your left / right elbow farther. To do this, gently push up on your forearm until you feel a gentle stretch on the back of your elbow. 3. Hold this position for 30 seconds. 4. Slowly return to the starting position. Repeat 2 times. Complete this exercise 3 times per week. Exercise F: Elbow Extension, Active-Assisted  1. Hold your left / right arm at your side, and straighten your elbow as much as you can using your left / right arm muscles. 2. Use your other hand to straighten the left / right elbow farther. To do this, gently push down on your forearm until you feel a gentle stretch on the inside of your elbow. 3. Hold this position for 30 seconds. 4. Slowly return to the starting position. Repeat 2 times. Complete this exercise 3 times per weeky. Exercise G: Forearm Rotation, Supination,  Active-Assisted  1. Sit with your left / right elbow bent in an "L" shape (90 degrees) with your forearm resting on a table. 2. Keeping your upper body and shoulder still, rotate your forearm so your left / right palm faces upward. 3. Use your other hand to help rotate your forearm further until you feel a gentle to moderate stretch. 4. Hold this position for 30 seconds. 5. Slowly release the stretch and return to the starting position. Repeat 2 times. Complete this exercise 3 times per week. Exercise H: Forearm Rotation, Pronation, Active-Assisted  1. Sit with your left / right elbow bent in an "L" shape (90 degrees) with your forearm resting on a table. 2. Keeping your upper body and shoulder still, rotate your forearm so your palm faces the tabletop. 3. Use your other hand to help rotate your forearm further until you feel a gentle to moderate stretch. 4. Hold this position for 30 seconds. 5. Slowly release the stretch and return to the starting position. Repeat 2 times. Complete this exercise 3 times per week. Exercise I: Elbow Flexion, Supine, Passive 1. Lie on your back. 2. Extend your left / right arm up in the air, bracing it with your other hand. 3. Let your left / right your hand slowly lower toward your shoulder, while your elbow stays pointed toward the ceiling. You should feel a gentle stretch along the back of your upper arm and elbow. 4. If instructed by your health care provider, you may increase the intensity of your stretch by adding a small wrist weight or hand weight. 5. Hold this position for 3 seconds. 6. Slowly return to the starting position. Repeat 2 times. Complete this exercise 3 times per week. Exercise J: Elbow Extension, Supine, Passive  1. Lie on your back. Make sure that you are in a comfortable position that lets you relax your arm muscles. 2. Place a folded towel under your left / right upper arm so your elbow and shoulder are at the same height.  Straighten your left / right arm so your elbow does not rest on the bed or towel. 3. Let the weight of your hand stretch your elbow. Keep your arm and chest muscles relaxed. You should feel a stretch on the inside of your elbow. 4. If told by your health care provider, you may increase the intensity of your stretch by adding a small wrist weight or hand weight. 5. Hold this position for 30 seconds. 6. Slowly release the stretch. Repeat 2 times. Complete this exercise 3 times per week. STRENGTHENING EXERCISES These exercises build strength and endurance in your elbow and forearm. Endurance is the ability to use your muscles for a long time, even after they get tired. Exercise K: Elbow Flexion, Isometric  1. Stand or sit up straight. 2. Bend your left / right elbow in an "L" shape (90 degrees) and turn your palm up so your forearm is at the height of your waist. 3.  Place your other hand on top of your forearm. Gently push down as your left / right arm resists. Push as hard as you can with both arms without causing any pain or movement at your left / right elbow. 4. Hold this position for 3 seconds. 5. Slowly release the tension in both arms. Let your muscles relax completely before repeating. Repeat 2 times. Complete this exercise 3 times per week. Exercise L: Elbow Extensors, Isometric  1. Stand or sit up straight. 2. Place your left / right arm so your palm faces your abdomen and it is at the height of your waist. 3. Place your other hand on the underside of your forearm. Gently push up as your left / right arm resists. Push as hard as you can with both arms, without causing any pain or movement at your left / right elbow. 4. Hold this position for 3 seconds. 5. Slowly release the tension in both arms. Let your muscles relax completely before repeating. Repeat _______2___ times. Complete this exercise 3 times per week. Exercise M: Elbow Flexion With Forearm Palm Up  1. Sit upright on a firm  chair without armrests, or stand. 2. Place your left / right arm at your side with your palm facing forward. 3. Holding a 5 lbweight or gripping a rubber exercise band or tubing, bend your elbow to bring your hand toward your shoulder. 4. Hold this position for 3 seconds. 5. Slowly return to the starting position. Repeat 2 times. Complete this exercise 3 times per week. Exercise N: Elbow Extension  1. Sit on a firm chair without armrests, or stand. 2. Keeping your upper arms at your sides, bring both hands up toward your left / right shoulder while you grip a rubber exercise band or tubing. Your left / right hand should be just below the other hand. 3. Straighten your left / right elbow. 4. Hold this position for 3 seconds. 5. Control the resistance of the band or tubing as your hand returns to your side. Repeat 2 times. Complete this exercise 3 times per week. Exercise O: Forearm Rotation, Supination  1. Sit with your left / right forearm supported on a table. Keep your elbow at waist height. 2. Rest your hand over the edge of the table with your palm facing down. 3. Gently hold a lightweight hammer. 4. Without moving your elbow, slowly rotate your forearm to turn your palm and hand upward to a "thumbs-up" position. 5. Hold this position for 3 seconds. 6. Slowly return to the starting position. Repeat 2 times. Complete this exercise 3 times per week. Exercise P: Forearm Rotation, Pronation  1. Sit with your left / right forearm supported on a table. Keep your elbow below shoulder height. 2. Rest your hand over the edge of the table with your palm facing up. 3. Gently hold a lightweight hammer. 4. Without moving your elbow, slowly rotate your forearm to turn your palm and hand upward to a "thumbs-up" position. 5. Hold this position for 3 seconds. 6. Slowly return to the starting position. Repeat 2 times. Complete this exercise 3 times per week.  Make sure you discuss any questions you  have with your health care provider. Document Released: 09/18/2005 Document Revised: 03/14/2016 Document Reviewed: 07/30/2015 Elsevier Interactive Patient Education  2018 [employer] [facility]  It is normal to feel mild stretching, pulling, tightness, or discomfort as you do these exercises, but you should stop right away if you feel sudden pain  or your pain gets worse.  Stretching and range of motion exercises These exercises warm up your muscles and joints and improve the movement and flexibility of your thigh. These exercises also help to relieve pain, numbness, and tingling. Exercise A: Hamstring stretch, supine    1. Lie on your back. Loop a belt or towel across the ball of your left / right foot The ball of your foot is on the walking surface, right under your toes. 2. Straighten your left / right knee and slowly pull on the belt to raise your leg. Stop when you feel a gentle stretch behind your left / right knee or thigh. ? Do not allow the knee to bend. ? Keep your other leg flat on the floor. 3. Hold this position for 30 seconds. Repeat 2 times. Complete this exercise 3 times a week. Strengthening exercises These exercises build strength and endurance in your thigh. Endurance is the ability to use your muscles for a long time, even after they get tired. Exercise B: Straight leg raises (hip extensors) 1. Lie on your belly on a bed or a firm surface with a pillow under your hips. 2. Bend your left / right knee so your foot is straight up in the air. 3. Squeeze your buttock muscles and lift your left / right thigh off the bed. Do not let your back arch. 4. Hold this position for 3 seconds. 5. Slowly return to the starting position. Let your muscles relax completely before you do another repetition. Repeat 2 times. Complete this exercise 3 times a week. Exercise C: Bridge (hip extensors)     1. Lie on your back on a firm surface with your knees bent and  your feet flat on the floor. 2. Tighten your buttocks muscles and lift your bottom off the floor until your trunk is level with your thighs. ? You should feel the muscles working in your buttocks and the back of your thighs. If you do not feel these muscles, slide your feet 1-2 inches (2.5-5 cm) farther away from your buttocks. ? Do not arch your back. 3. Hold this position for 3 seconds. 4. Slowly lower your hips to the starting position. 5. Let your buttocks muscles relax completely between repetitions. If this exercise is too easy, try doing it with your arms crossed over your chest. Repeat 2 times. Complete this exercise 3 times a week. Exercise D: Hamstring eccentric, prone 1. Lie on your belly on a bed or on the floor. 2. Start with your legs straight. Cross your legs at the ankles with your left / right leg on top. 3. Using your bottom leg to do the work, bend both knees. 4. Using just your left / right leg alone, slowly lower your leg back down toward the bed. Add a 5 lb weight as told by your health care provider. 5. Let your muscles relax completely between repetitions. Repeat 2 times. Complete this exercise 3 times a week. Exercise E: Squats 1. Stand in front of a table, with your feet and knees pointing straight ahead. You may rest your hands on the table for balance but not for support. 2. Slowly bend your knees and lower your hips like you are going to sit in a chair. Keep your thighs straight or pointed slightly outward. ? Keep your weight over your heels, not over your toes. ? Keep your lower legs upright so they are parallel with the table legs. ? Do not let your hips go lower  than your knees. Stop when your knees are bent to the shape of an upside-down letter "L" (90 degree angle). ? Do not bend lower than told by your health care provider. ? If your knee pain increases, do not bend as low. 3. Hold the squat position 1-2 seconds. 4. Slowly push with your legs to return to  standing. Do not use your hands to pull yourself to standing. Repeat 2 times. Complete this exercise 3 times a week. Make sure you discuss any questions you have with your health care provider. Document Released: 11/04/2005 Document Revised: 07/11/2016 Document Reviewed: 08/08/2015 Elsevier Interactive Patient Education  [employer].

## 2021-01-30 ENCOUNTER — Other Ambulatory Visit: Payer: Self-pay | Admitting: Family Medicine

## 2021-01-30 DIAGNOSIS — M6289 Other specified disorders of muscle: Secondary | ICD-10-CM

## 2021-01-30 MED ORDER — ROSUVASTATIN CALCIUM 20 MG PO TABS
20.0000 mg | ORAL_TABLET | Freq: Every day | ORAL | 3 refills | Status: DC
Start: 2021-01-30 — End: 2022-05-27

## 2021-02-05 DIAGNOSIS — S33140A Subluxation of L4/L5 lumbar vertebra, initial encounter: Secondary | ICD-10-CM | POA: Diagnosis not present

## 2021-02-05 DIAGNOSIS — M47817 Spondylosis without myelopathy or radiculopathy, lumbosacral region: Secondary | ICD-10-CM | POA: Diagnosis not present

## 2021-02-05 DIAGNOSIS — M9903 Segmental and somatic dysfunction of lumbar region: Secondary | ICD-10-CM | POA: Diagnosis not present

## 2021-02-12 ENCOUNTER — Encounter: Payer: Self-pay | Admitting: Physical Therapy

## 2021-02-12 ENCOUNTER — Other Ambulatory Visit: Payer: Self-pay

## 2021-02-12 ENCOUNTER — Ambulatory Visit: Payer: BC Managed Care – PPO | Attending: Family Medicine | Admitting: Physical Therapy

## 2021-02-12 DIAGNOSIS — R252 Cramp and spasm: Secondary | ICD-10-CM | POA: Diagnosis not present

## 2021-02-12 DIAGNOSIS — M79604 Pain in right leg: Secondary | ICD-10-CM | POA: Diagnosis not present

## 2021-02-12 DIAGNOSIS — M6281 Muscle weakness (generalized): Secondary | ICD-10-CM

## 2021-02-12 NOTE — Therapy (Signed)
[place] [place], [place], [address] Phone: [phone]   Fax:  [phone]  Physical Therapy Evaluation  Patient Details  Name: Sean Bowers MRN: [id number] Date of Birth: [birth year] Referring Provider (PT): Nani Ravens   Encounter Date: 02/12/2021   PT End of Session - 02/12/21 1607    Visit Number 1    Date for PT Re-Evaluation 04/14/21    PT Start Time 1528    PT Stop Time 1607    PT Time Calculation (min) 39 min    Activity Tolerance Patient tolerated treatment well    Behavior During Therapy [facility] for tasks assessed/performed           Past Medical History:  Diagnosis Date  . Allergy   . Arthritis   . Asthma    no inhalers  . Diverticulitis 09/2016   seen in Urgent Care  . Genital warts   . History of chicken pox   . History of hay fever   . Hyperlipidemia 09/27/2016  . Hypertension     Past Surgical History:  Procedure Laterality Date  . CARPAL TUNNEL RELEASE Bilateral 2006-2007    There were no vitals filed for this visit.    Subjective Assessment - 02/12/21 1533    Subjective Pt reports that he has been having R hamstring tightness/mild pain for a few years. Pt states he only notices the pain in his R leg when he is sitting for prolonged periods. Pt states relief from pain when he is up and moving around. Pt works as a Air traffic controller at [employer] and reports that this does not stop him from doing his job. Pt also reports that he was diagnosed with L lateral epicondylitis beginning about ~6 weeks ago; has the most trouble picking up something with L elbow extended and wrist flexed. Pt reports occasional radiating pain into R foot.    Limitations Sitting    How long can you sit comfortably? 45 minutes    Patient Stated Goals reduce pain    Currently in Pain? No/denies    Pain Score 0-No pain    Pain Location Leg    Pain Orientation Posterior;Right    Pain Descriptors / Indicators  Aching;Nagging    Pain Type Chronic pain    Pain Radiating Towards occasionally towards posterior RLE    Pain Onset More than a month ago    Pain Frequency Intermittent    Aggravating Factors  prolonged sitting    Pain Relieving Factors standing, walking              [facility] PT Assessment - 02/12/21 0001      Assessment   Medical Diagnosis R hamstring tightness/L lat epicondylitis    Referring Provider (PT) Wendling    Hand Dominance Right    Prior Therapy PT after carpal tunnel surgery      Precautions   Precautions None      Restrictions   Weight Bearing Restrictions No      Balance Screen   Has the patient fallen in the past 6 months No    Has the patient had a decrease in activity level because of a fear of falling?  No    Is the patient reluctant to leave their home because of a fear of falling?  No      Home Environment   Additional Comments reports no trouble with stairs      Prior Function   Level of Independence Independent  Vocation Full time employment    Clinical cytogeneticist    Leisure handyman      Functional Tests   Functional tests Sit to Stand      Sit to Stand   Comments [facility]      Posture/Postural Control   Posture/Postural Control Postural limitations    Postural Limitations Rounded Shoulders;Forward head      ROM / Strength   AROM / PROM / Strength AROM;Strength      AROM   Overall AROM Comments lumbar flexion limited 50% by HS tightness      Strength   Overall Strength Comments BLE 5/5, BUE 5/5      Flexibility   Soft Tissue Assessment /Muscle Length yes    Hamstrings very tight R>L    Quadriceps tight    ITB tight    Piriformis tight      Palpation   Palpation comment no tenderness L wrist extensors or over L lat epicondyle      Special Tests   Other special tests + SLR 40 deg RLE, 60 deg LLE                      Objective measurements completed on examination: See above findings.                PT Education - 02/12/21 1607    Education Details Pt educated on POC and HEP    Person(s) Educated Patient    Methods Explanation;Demonstration;Handout    Comprehension Verbalized understanding;Returned demonstration            PT Short Term Goals - 02/12/21 1652      PT SHORT TERM GOAL #1   Title Pt will be I with initial HEP    Time 2    Period Weeks    Status New    Target Date 02/26/21             PT Long Term Goals - 02/12/21 1653      PT LONG TERM GOAL #1   Title Pt will demo I with advanced HEP    Time 6    Period Weeks    Status New    Target Date 03/26/21      PT LONG TERM GOAL #2   Title Pt will report able to sit >1 hr with no increase in RLE pain    Time 6    Period Weeks    Status New    Target Date 03/26/21      PT LONG TERM GOAL #3   Title Pt will demo B SLR to >/=70 deg    Time 6    Period Weeks    Status New    Target Date 03/26/21                  Plan - 02/12/21 1607    Clinical Impression Statement Pt presents to clinic with reports of R hamstring tightness/discomfort present for several years and worse with prolonged sitting, no known MOI. Pt demos very tight hamstrings B with + SLR R>L. Pt also demos tightness of hip IR/ER along with palpable tightness of lumbar paraspinals. Lumbar AROM limited by LE flexibility deficits. Pt does note occasional radiating pain from LB into posterior RLE; cannot rule out lumbar involvement. Pt would benefit from manual stretching and core/lumbar stability to address the above impairments. Pt also experienced bout of L lat epicondylitis; wore a brace for about 2 weeks  and states he is no longer having pain/problems with this. Neg lat epicondylitis testing this rx, no tenderness to palpation, and strength WFL; will incorporate L UE into rehab if symtpoms recur.    Examination-Activity Limitations Sit    Examination-Participation Restrictions Community Activity;Interpersonal  Relationship    Stability/Clinical Decision Making Stable/Uncomplicated    Clinical Decision Making Low    Rehab Potential Good    PT Frequency 2x / week    PT Duration 6 weeks    PT Treatment/Interventions ADLs/Self Care Home Management;Electrical Stimulation;Iontophoresis 4mg /ml Dexamethasone;Moist Heat;Neuromuscular re-education;Therapeutic exercise;Therapeutic activities;Patient/family education;Manual techniques;Dry needling;Passive range of motion    PT Next Visit Plan hamstring stretching, hip PROM/flexibility, lumbar/core stab ex's, manual/modalities as indicated    PT Home Exercise Plan see pt instructions for stretches added to HEP given by MD    Consulted and Agree with Plan of Care Patient           Patient will benefit from skilled therapeutic intervention in order to improve the following deficits and impairments:  Decreased range of motion,Increased muscle spasms,Pain,Hypomobility,Impaired flexibility  Visit Diagnosis: Pain in right leg  Muscle weakness (generalized)  Cramp and spasm     Problem List Patient Active Problem List   Diagnosis Date Noted  . Pure hypercholesterolemia 11/24/2019  . Chronic right-sided low back pain without sciatica 05/24/2019  . Asymptomatic varicose veins of both lower extremities 11/30/2018  . Mild intermittent asthma without complication 45/40/9811  . OSA on CPAP 11/20/2017  . Dyspnea   . Hypokalemia 11/04/2017  . Liver lesion, right lobe 11/04/2017  . Chest pain 11/03/2017  . Essential hypertension 09/27/2016  . Hyperlipidemia 09/27/2016  . Genital warts 02/01/2015  . Actinic keratosis 11/08/2014  . Angioma 11/08/2014  . Nevus 11/08/2014  . Neck pain 10/28/2013   Amador Cunas, PT, DPT Donald Prose Shantice Menger 02/12/2021, 4:56 PM  [address]. [place], [place], [address] Phone: [phone]   Fax:  [phone]  Name: Sean Bowers MRN: [id number] Date of Birth:  12/25/1955

## 2021-02-12 NOTE — Patient Instructions (Signed)
Access Code: [email or web address] URL: [email or web address] Date: 02/12/2021 Prepared by: Amador Cunas  Exercises Seated Hamstring Stretch - 1 x daily - 5 x weekly - 2 sets - 2 reps - 15-20 sec hold Seated Piriformis Stretch - 1 x daily - 5 x weekly - 2 sets - 2 reps - 15-20 sec hold Seated Piriformis Stretch - 1 x daily - 5 x weekly - 2 sets - 2 reps - 15-20 sec hold

## 2021-02-19 ENCOUNTER — Other Ambulatory Visit: Payer: Self-pay | Admitting: Family Medicine

## 2021-02-19 MED ORDER — TIZANIDINE HCL 4 MG PO TABS
4.0000 mg | ORAL_TABLET | Freq: Four times a day (QID) | ORAL | 0 refills | Status: DC | PRN
Start: 2021-02-19 — End: 2022-03-15

## 2021-02-21 ENCOUNTER — Encounter: Payer: Self-pay | Admitting: Physical Therapy

## 2021-02-21 ENCOUNTER — Other Ambulatory Visit: Payer: Self-pay

## 2021-02-21 ENCOUNTER — Ambulatory Visit: Payer: BC Managed Care – PPO | Attending: Family Medicine | Admitting: Physical Therapy

## 2021-02-21 DIAGNOSIS — R252 Cramp and spasm: Secondary | ICD-10-CM | POA: Diagnosis not present

## 2021-02-21 DIAGNOSIS — M79604 Pain in right leg: Secondary | ICD-10-CM | POA: Insufficient documentation

## 2021-02-21 DIAGNOSIS — M6281 Muscle weakness (generalized): Secondary | ICD-10-CM | POA: Diagnosis not present

## 2021-02-21 NOTE — Therapy (Signed)
[address]. [place], [place], [address] Phone: [phone]   Fax:  [phone]  Physical Therapy Treatment  Patient Details  Name: Sean Bowers MRN: [id number] Date of Birth: May 26, 1956 Referring Provider (PT): Nani Ravens   Encounter Date: 02/21/2021   PT End of Session - 02/21/21 0848    Visit Number 2    Date for PT Re-Evaluation 04/14/21    PT Start Time 0802    PT Stop Time 0845    PT Time Calculation (min) 43 min    Behavior During Therapy [facility] for tasks assessed/performed           Past Medical History:  Diagnosis Date  . Allergy   . Arthritis   . Asthma    no inhalers  . Diverticulitis 09/2016   seen in Urgent Care  . Genital warts   . History of chicken pox   . History of hay fever   . Hyperlipidemia 09/27/2016  . Hypertension     Past Surgical History:  Procedure Laterality Date  . CARPAL TUNNEL RELEASE Bilateral 2006-2007    There were no vitals filed for this visit.   Subjective Assessment - 02/21/21 0800    Subjective "I done seem to be getting better"    Currently in Pain? No/denies                             [facility] Adult PT Treatment/Exercise - 02/21/21 0001      Exercises   Exercises Lumbar;Knee/Hip      Lumbar Exercises: Stretches   Passive Hamstring Stretch 5 reps;10 seconds;20 seconds;Right    Single Knee to Chest Stretch Right;3 reps;10 seconds;20 seconds    ITB Stretch Right;2 reps;10 seconds    Piriformis Stretch Right;5 reps;10 seconds      Lumbar Exercises: Aerobic   Recumbent Bike L2 x 2 min    Nustep L4 x 6 min      Lumbar Exercises: Machines for Strengthening   Leg Press 20lb 2x10    Other Lumbar Machine Exercise Row & Lats 25lb 2x10      Lumbar Exercises: Seated   Sit to Stand 10 reps   holding yellow ball     Lumbar Exercises: Supine   Ab Set 2 seconds;20 reps                    PT Short Term Goals - 02/21/21 0851       PT SHORT TERM GOAL #1   Title Pt will be I with initial HEP    Status Achieved             PT Long Term Goals - 02/21/21 0851      PT LONG TERM GOAL #2   Title Pt will report able to sit >1 hr with no increase in RLE pain    Status On-going      PT LONG TERM GOAL #3   Title Pt will demo B SLR to >/=70 deg    Status On-going                 Plan - 02/21/21 0848    Clinical Impression Statement Today's session started with the initiation of LE , postural, and core strength. Postural cue needed with seated rows to keep shoulders back and not allow them to protract forward. Cues cor core engagement needed with ab sets. RLE Hs and piriformis are  very tight noted with passive stretching. Pt reports compliance with HEP.    Examination-Activity Limitations Sit    Examination-Participation Restrictions Community Activity;Interpersonal Relationship    Stability/Clinical Decision Making Stable/Uncomplicated    Rehab Potential Good    PT Frequency 2x / week    PT Treatment/Interventions ADLs/Self Care Home Management;Electrical Stimulation;Iontophoresis 4mg /ml Dexamethasone;Moist Heat;Neuromuscular re-education;Therapeutic exercise;Therapeutic activities;Patient/family education;Manual techniques;Dry needling;Passive range of motion    PT Next Visit Plan hamstring stretching, hip PROM/flexibility, lumbar/core stab ex's, manual/modalities as indicated           Patient will benefit from skilled therapeutic intervention in order to improve the following deficits and impairments:  Decreased range of motion,Increased muscle spasms,Pain,Hypomobility,Impaired flexibility  Visit Diagnosis: Cramp and spasm  Muscle weakness (generalized)  Pain in right leg     Problem List Patient Active Problem List   Diagnosis Date Noted  . Pure hypercholesterolemia 11/24/2019  . Chronic right-sided low back pain without sciatica 05/24/2019  . Asymptomatic varicose veins of both lower  extremities 11/30/2018  . Mild intermittent asthma without complication 96/75/9163  . OSA on CPAP 11/20/2017  . Dyspnea   . Hypokalemia 11/04/2017  . Liver lesion, right lobe 11/04/2017  . Chest pain 11/03/2017  . Essential hypertension 09/27/2016  . Hyperlipidemia 09/27/2016  . Genital warts 02/01/2015  . Actinic keratosis 11/08/2014  . Angioma 11/08/2014  . Nevus 11/08/2014  . Neck pain 10/28/2013    Sean Bowers, PTA 02/21/2021, 8:51 AM  [place]. [place], [place], [address] Phone: [phone]   Fax:  [phone]  Name: Sean Bowers MRN: [id number] Date of Birth: 1956/01/21

## 2021-02-23 ENCOUNTER — Ambulatory Visit: Payer: BC Managed Care – PPO | Admitting: Physical Therapy

## 2021-02-23 ENCOUNTER — Other Ambulatory Visit: Payer: Self-pay

## 2021-02-23 DIAGNOSIS — R252 Cramp and spasm: Secondary | ICD-10-CM

## 2021-02-23 DIAGNOSIS — M79604 Pain in right leg: Secondary | ICD-10-CM | POA: Diagnosis not present

## 2021-02-23 DIAGNOSIS — M6281 Muscle weakness (generalized): Secondary | ICD-10-CM

## 2021-02-23 NOTE — Therapy (Signed)
[place] [place]. [place], [place], [address] Phone: [phone]   Fax:  [phone]  Physical Therapy Treatment  Patient Details  Name: Sean Bowers MRN: [id number] Date of Birth: 1955/11/22 Referring Provider (PT): Sean Bowers   Encounter Date: 02/23/2021   PT End of Session - 02/23/21 0921    Visit Number 3    Date for PT Re-Evaluation 04/14/21    PT Start Time 0840    PT Stop Time 0922    PT Time Calculation (min) 42 min           Past Medical History:  Diagnosis Date  . Allergy   . Arthritis   . Asthma    no inhalers  . Diverticulitis 09/2016   seen in Urgent Care  . Genital warts   . History of chicken pox   . History of hay fever   . Hyperlipidemia 09/27/2016  . Hypertension     Past Surgical History:  Procedure Laterality Date  . CARPAL TUNNEL RELEASE Bilateral 2006-2007    There were no vitals filed for this visit.   Subjective Assessment - 02/23/21 0841    Subjective I am trying to be positive that this will help                             [facility] Adult PT Treatment/Exercise - 02/23/21 0001      Lumbar Exercises: Aerobic   Recumbent Bike L 4 5 min      Lumbar Exercises: Standing   Other Standing Lumbar Exercises 5# dead lift 2 sets 10 Modified with 8 inch box      Lumbar Exercises: Supine   Ab Set 15 reps;3 seconds   with ball   Single Leg Bridge Compliant;10 reps;2 seconds    Straight Leg Raise 10 reps;2 seconds   with abd and red tband   Other Supine Lumbar Exercises bridge with feet on ball, KTC and obl 10 x each      Knee/Hip Exercises: Stretches   Active Hamstring Stretch Limitations educ on act variations of HS stretching      Knee/Hip Exercises: Machines for Strengthening   Cybex Leg Press 30# BIL 10 x, SL LE 10x 20#      Knee/Hip Exercises: Standing   Heel Raises Both;15 reps   black bar   Other Standing Knee Exercises red tband BIL hip ext and abd  10       Manual Therapy   Manual Therapy Passive ROM    Passive ROM RT LE                    PT Short Term Goals - 02/21/21 1007      PT SHORT TERM GOAL #1   Title Pt will be I with initial HEP    Status Achieved             PT Long Term Goals - 02/21/21 0851      PT LONG TERM GOAL #2   Title Pt will report able to sit >1 hr with no increase in RLE pain    Status On-going      PT LONG TERM GOAL #3   Title Pt will demo B SLR to >/=70 deg    Status On-going                 Plan - 02/23/21 0922    Clinical Impression Statement  progressed HS ex today, stressed need to stretch multi times daily- showed various ways to stretch ( supine,sitting , standing) CR with PROM to increase HS length. STG met. Eval mentions elbow pain but pt denies any issues currently    PT Treatment/Interventions ADLs/Self Care Home Management;Electrical Stimulation;Iontophoresis 50m/ml Dexamethasone;Moist Heat;Neuromuscular re-education;Therapeutic exercise;Therapeutic activities;Patient/family education;Manual techniques;Dry needling;Passive range of motion    PT Next Visit Plan hamstring stretching, hip PROM/flexibility, lumbar/core stab ex's, manual/modalities as indicated           Patient will benefit from skilled therapeutic intervention in order to improve the following deficits and impairments:  Decreased range of motion,Increased muscle spasms,Pain,Hypomobility,Impaired flexibility  Visit Diagnosis: Cramp and spasm  Muscle weakness (generalized)  Pain in right leg     Problem List Patient Active Problem List   Diagnosis Date Noted  . Pure hypercholesterolemia 11/24/2019  . Chronic right-sided low back pain without sciatica 05/24/2019  . Asymptomatic varicose veins of both lower extremities 11/30/2018  . Mild intermittent asthma without complication 030/10/3798 . OSA on CPAP 11/20/2017  . Dyspnea   . Hypokalemia 11/04/2017  . Liver lesion, right lobe 11/04/2017  .  Chest pain 11/03/2017  . Essential hypertension 09/27/2016  . Hyperlipidemia 09/27/2016  . Genital warts 02/01/2015  . Actinic keratosis 11/08/2014  . Angioma 11/08/2014  . Nevus 11/08/2014  . Neck pain 10/28/2013    Sean Bowers,Sean Bowers PTA 02/23/2021, 9:24 AM  [place] [address]Phone: [phone]  Fax:  [phone] Name: Sean MiskellMRN: [id number] of Birth: [birth year]

## 2021-02-27 ENCOUNTER — Ambulatory Visit: Payer: BC Managed Care – PPO | Admitting: Physical Therapy

## 2021-02-27 ENCOUNTER — Telehealth: Payer: Self-pay | Admitting: Family Medicine

## 2021-02-27 NOTE — Telephone Encounter (Signed)
PCP has the paperwork

## 2021-02-27 NOTE — Telephone Encounter (Signed)
Pt's spouse dropped off disc and printed out results for provider to see and have on pt's chart (disc and results  is in a yellow envelope) Document and disc put at front office tray under provider name. (pts spouse was informed possible that provider will not be able to open disc info but can see the printed out results)

## 2021-03-01 ENCOUNTER — Ambulatory Visit: Payer: BC Managed Care – PPO | Admitting: Physical Therapy

## 2021-03-01 ENCOUNTER — Other Ambulatory Visit: Payer: Self-pay

## 2021-03-01 DIAGNOSIS — M79604 Pain in right leg: Secondary | ICD-10-CM | POA: Diagnosis not present

## 2021-03-01 DIAGNOSIS — M6281 Muscle weakness (generalized): Secondary | ICD-10-CM

## 2021-03-01 DIAGNOSIS — R252 Cramp and spasm: Secondary | ICD-10-CM

## 2021-03-01 NOTE — Therapy (Signed)
[address]. [place], [place], [address] Phone: [phone]   Fax:  [phone]  Physical Therapy Treatment  Patient Details  Name: Sean Bowers MRN: [id number] Date of Birth: March 01, 1956 Referring Provider (PT): Nani Ravens   Encounter Date: 03/01/2021   PT End of Session - 03/01/21 0850    Visit Number 4    Date for PT Re-Evaluation 04/14/21    PT Start Time 0850    PT Stop Time 0925    PT Time Calculation (min) 35 min    Activity Tolerance Patient tolerated treatment well    Behavior During Therapy [facility] for tasks assessed/performed           Past Medical History:  Diagnosis Date  . Allergy   . Arthritis   . Asthma    no inhalers  . Diverticulitis 09/2016   seen in Urgent Care  . Genital warts   . History of chicken pox   . History of hay fever   . Hyperlipidemia 09/27/2016  . Hypertension     Past Surgical History:  Procedure Laterality Date  . CARPAL TUNNEL RELEASE Bilateral 2006-2007    There were no vitals filed for this visit.   Subjective Assessment - 03/01/21 0850    Subjective Pt arrived 10 mins late. "Doing good" Pt stated he has not seen any signifigant improvement or changes.    Currently in Pain? No/denies                             [facility] Adult PT Treatment/Exercise - 03/01/21 0001      Lumbar Exercises: Aerobic   Recumbent Bike L5 x 6 mins      Lumbar Exercises: Standing   Other Standing Lumbar Exercises 7# deadlft modified with 8" box    Other Standing Lumbar Exercises 7# SL deadlift x10 each side      Knee/Hip Exercises: Machines for Strengthening   Cybex Leg Press 30# BIL 2x10 x, SL LE 10x 20#      Knee/Hip Exercises: Standing   Hip Abduction Both;2 sets;10 reps;Knee straight   red TBand   Hip Extension Both;2 sets;10 reps;Knee straight   red TBand   Walking with Sports Cord 30# fwd/back x5 side/side 3x      Manual Therapy   Manual Therapy Passive  ROM    Passive ROM RT LE                    PT Short Term Goals - 02/21/21 3220      PT SHORT TERM GOAL #1   Title Pt will be I with initial HEP    Status Achieved             PT Long Term Goals - 03/01/21 1013      PT LONG TERM GOAL #1   Title Pt will demo I with advanced HEP    Status On-going      PT LONG TERM GOAL #2   Title Pt will report able to sit >1 hr with no increase in RLE pain    Status On-going      PT LONG TERM GOAL #3   Title Pt will demo B SLR to >/=70 deg    Status On-going                 Plan - 03/01/21 0937    Clinical Impression Statement Pt tolerated progression of  HS ex today. Verbal cues needed for correct form during deadlifts. Conintued limited ROM with HS stretch.    PT Treatment/Interventions ADLs/Self Care Home Management;Electrical Stimulation;Iontophoresis 4mg /ml Dexamethasone;Moist Heat;Neuromuscular re-education;Therapeutic exercise;Therapeutic activities;Patient/family education;Manual techniques;Dry needling;Passive range of motion    PT Next Visit Plan Continue hamstring strength, stretching, hip PROM/flexibility, lumbar/core stab ex's as indicated. Try treadmill SL pull through with treadmill off next session.           Patient will benefit from skilled therapeutic intervention in order to improve the following deficits and impairments:  Decreased range of motion,Increased muscle spasms,Pain,Hypomobility,Impaired flexibility  Visit Diagnosis: Cramp and spasm  Muscle weakness (generalized)  Pain in right leg     Problem List Patient Active Problem List   Diagnosis Date Noted  . Pure hypercholesterolemia 11/24/2019  . Chronic right-sided low back pain without sciatica 05/24/2019  . Asymptomatic varicose veins of both lower extremities 11/30/2018  . Mild intermittent asthma without complication 69/24/9324  . OSA on CPAP 11/20/2017  . Dyspnea   . Hypokalemia 11/04/2017  . Liver lesion, right lobe  11/04/2017  . Chest pain 11/03/2017  . Essential hypertension 09/27/2016  . Hyperlipidemia 09/27/2016  . Genital warts 02/01/2015  . Actinic keratosis 11/08/2014  . Angioma 11/08/2014  . Nevus 11/08/2014  . Neck pain 10/28/2013    Ernst Spell, SPTA 03/01/2021, 10:15 AM  [place]. [place], [place], [address] Phone: [phone]   Fax:  [phone]  Name: Almalik Weissberg MRN: [id number] Date of Birth: 08-Sep-1956

## 2021-03-06 ENCOUNTER — Encounter: Payer: Self-pay | Admitting: Physical Therapy

## 2021-03-06 ENCOUNTER — Ambulatory Visit: Payer: BC Managed Care – PPO | Admitting: Physical Therapy

## 2021-03-06 ENCOUNTER — Other Ambulatory Visit: Payer: Self-pay | Admitting: Family Medicine

## 2021-03-06 ENCOUNTER — Other Ambulatory Visit: Payer: Self-pay

## 2021-03-06 DIAGNOSIS — R252 Cramp and spasm: Secondary | ICD-10-CM | POA: Diagnosis not present

## 2021-03-06 DIAGNOSIS — M6281 Muscle weakness (generalized): Secondary | ICD-10-CM

## 2021-03-06 DIAGNOSIS — M79604 Pain in right leg: Secondary | ICD-10-CM | POA: Diagnosis not present

## 2021-03-06 DIAGNOSIS — M6289 Other specified disorders of muscle: Secondary | ICD-10-CM

## 2021-03-06 NOTE — Progress Notes (Signed)
r 

## 2021-03-06 NOTE — Therapy (Signed)
[place] [place], [place], [address] Phone: [phone]   Fax:  [phone]  Physical Therapy Treatment  Patient Details  Name: Sean Bowers MRN: [id number] Date of Birth: 03-04-1956 Referring Provider (PT): Nani Ravens   Encounter Date: 03/06/2021   PT End of Session - 03/06/21 0844    Visit Number 5    Date for PT Re-Evaluation 04/14/21    PT Start Time 0800    PT Stop Time 0842    PT Time Calculation (min) 42 min    Activity Tolerance Patient tolerated treatment well    Behavior During Therapy [facility] for tasks assessed/performed           Past Medical History:  Diagnosis Date  . Allergy   . Arthritis   . Asthma    no inhalers  . Diverticulitis 09/2016   seen in Urgent Care  . Genital warts   . History of chicken pox   . History of hay fever   . Hyperlipidemia 09/27/2016  . Hypertension     Past Surgical History:  Procedure Laterality Date  . CARPAL TUNNEL RELEASE Bilateral 2006-2007    There were no vitals filed for this visit.   Subjective Assessment - 03/06/21 0802    Subjective "Feeling pretty good" 6-7/10    Currently in Pain? Yes    Pain Score 6     Pain Location Leg    Pain Orientation Right                             [facility] Adult PT Treatment/Exercise - 03/06/21 0001      Lumbar Exercises: Stretches   Single Knee to Chest Stretch Right;Left;4 reps;10 seconds    Lower Trunk Rotation 3 reps;10 seconds      Lumbar Exercises: Aerobic   Recumbent Bike L5 x 5 mins      Lumbar Exercises: Machines for Strengthening   Cybex Lumbar Extension Black band 2x10    Leg Press 40lb 2x10    Other Lumbar Machine Exercise Row & Lats 35lb 2x10      Lumbar Exercises: Standing   Shoulder Extension Strengthening;20 reps;Power [employer];Both    Shoulder Extension Limitations 10      Lumbar Exercises: Supine   Bridge Compliant;20 reps;2 seconds    Single Leg Bridge Compliant;10  reps;2 seconds      Knee/Hip Exercises: Stretches   Passive Hamstring Stretch Both;5 reps;10 seconds    Piriformis Stretch Both;3 reps;10 seconds                    PT Short Term Goals - 03/06/21 0844      PT SHORT TERM GOAL #1   Title Pt will be I with initial HEP    Status Achieved             PT Long Term Goals - 03/06/21 0844      PT LONG TERM GOAL #1   Title Pt will demo I with advanced HEP      PT LONG TERM GOAL #2   Title Pt will report able to sit >1 hr with no increase in RLE pain    Status On-going      PT LONG TERM GOAL #3   Title Pt will demo B SLR to >/=70 deg                 Plan - 03/06/21 7371  Clinical Impression Statement Pt enters clinic reporting soma pain in the R glute low back area. Recently reached out to MD about progress and MD suggested that pt could see a physical medicine specialist. Bilateral HS and piriformis remains. Cues needed for core engagement with seated rows and lats.Some postural weakness present with standing shoulder Ext.    Examination-Participation Restrictions Community Activity;Interpersonal Relationship    Stability/Clinical Decision Making Stable/Uncomplicated    Rehab Potential Good    PT Frequency 2x / week    PT Duration 6 weeks    PT Treatment/Interventions ADLs/Self Care Home Management;Electrical Stimulation;Iontophoresis 4mg /ml Dexamethasone;Moist Heat;Neuromuscular re-education;Therapeutic exercise;Therapeutic activities;Patient/family education;Manual techniques;Dry needling;Passive range of motion    PT Next Visit Plan Continue hamstring strength, stretching, hip PROM/flexibility, lumbar/core stab ex's as indicated.           Patient will benefit from skilled therapeutic intervention in order to improve the following deficits and impairments:  Decreased range of motion,Increased muscle spasms,Pain,Hypomobility,Impaired flexibility  Visit Diagnosis: Pain in right leg  Muscle weakness  (generalized)  Cramp and spasm     Problem List Patient Active Problem List   Diagnosis Date Noted  . Pure hypercholesterolemia 11/24/2019  . Chronic right-sided low back pain without sciatica 05/24/2019  . Asymptomatic varicose veins of both lower extremities 11/30/2018  . Mild intermittent asthma without complication 97/67/3419  . OSA on CPAP 11/20/2017  . Dyspnea   . Hypokalemia 11/04/2017  . Liver lesion, right lobe 11/04/2017  . Chest pain 11/03/2017  . Essential hypertension 09/27/2016  . Hyperlipidemia 09/27/2016  . Genital warts 02/01/2015  . Actinic keratosis 11/08/2014  . Angioma 11/08/2014  . Nevus 11/08/2014  . Neck pain 10/28/2013    Scot Jun, PTA 03/06/2021, 8:52 AM  [address] [place], [place], [address] Phone: [phone]   Fax:  [phone]  Name: Sean Bowers MRN: [id number] Date of Birth: Sep 23, 1956

## 2021-03-08 ENCOUNTER — Ambulatory Visit: Payer: BC Managed Care – PPO | Admitting: Physical Therapy

## 2021-03-09 ENCOUNTER — Encounter: Payer: Self-pay | Admitting: Physical Medicine and Rehabilitation

## 2021-03-12 ENCOUNTER — Ambulatory Visit: Payer: BC Managed Care – PPO | Admitting: Physical Therapy

## 2021-04-08 ENCOUNTER — Other Ambulatory Visit: Payer: Self-pay | Admitting: Family Medicine

## 2021-05-16 DIAGNOSIS — J069 Acute upper respiratory infection, unspecified: Secondary | ICD-10-CM | POA: Diagnosis not present

## 2021-05-16 DIAGNOSIS — Z20828 Contact with and (suspected) exposure to other viral communicable diseases: Secondary | ICD-10-CM | POA: Diagnosis not present

## 2021-05-17 ENCOUNTER — Encounter: Payer: BC Managed Care – PPO | Admitting: Physical Medicine and Rehabilitation

## 2021-05-18 ENCOUNTER — Ambulatory Visit: Payer: BC Managed Care – PPO | Admitting: Family Medicine

## 2021-05-18 ENCOUNTER — Telehealth: Payer: Self-pay

## 2021-05-18 ENCOUNTER — Encounter: Payer: Self-pay | Admitting: Family Medicine

## 2021-05-18 ENCOUNTER — Other Ambulatory Visit: Payer: Self-pay

## 2021-05-18 VITALS — BP 108/72 | HR 77 | Temp 98.3°F | Ht 68.0 in | Wt 204.2 lb

## 2021-05-18 DIAGNOSIS — H6591 Unspecified nonsuppurative otitis media, right ear: Secondary | ICD-10-CM

## 2021-05-18 DIAGNOSIS — C61 Malignant neoplasm of prostate: Secondary | ICD-10-CM

## 2021-05-18 DIAGNOSIS — J209 Acute bronchitis, unspecified: Secondary | ICD-10-CM

## 2021-05-18 MED ORDER — BENZONATATE 100 MG PO CAPS: 100.0000 mg | ORAL_CAPSULE | Freq: Three times a day (TID) | ORAL | 0 refills | Status: DC | PRN

## 2021-05-18 MED ORDER — PREDNISONE 20 MG PO TABS: 40.0000 mg | ORAL_TABLET | Freq: Every day | ORAL | 0 refills | Status: DC

## 2021-05-18 MED ORDER — AZITHROMYCIN 250 MG PO TABS: ORAL_TABLET | ORAL | 0 refills | Status: DC

## 2021-05-18 NOTE — Patient Instructions (Addendum)
Continue to push fluids, practice good hand hygiene, and cover your mouth if you cough.  If you start having fevers, shaking or shortness of breath, seek immediate care.  Wait 2-3 days before taking the Zpak. If you spike a fever or get worse, take it even if it hasn't been 2-3 days yet.   Let us know if you need anything.

## 2021-05-18 NOTE — Progress Notes (Signed)
Chief Complaint  Patient presents with   Cough   Sinus Problem    Darden Amber here for URI complaints.  Duration: 4 days  Associated symptoms: sinus headache, sinus congestion, rhinorrhea, ear pain, sore throat, myalgia, and coughing Denies: sinus pain, itchy watery eyes, ear drainage, wheezing, shortness of breath, and fevers, N/V/D Treatment to date: Nyquil Sick contacts: Yes Tested - for covid x 3.   Past Medical History:  Diagnosis Date   Allergy    Arthritis    Asthma    no inhalers   Diverticulitis 09/2016   seen in Urgent Care   Genital warts    History of chicken pox    History of hay fever    Hyperlipidemia 09/27/2016   Hypertension     BP 108/72   Pulse 77   Temp 98.3 F (36.8 C) (Oral)   Ht 5\' 8"  (1.727 m)   Wt 204 lb 4 oz (92.6 kg)   SpO2 96%   BMI 31.06 kg/m  General: Awake, alert, appears stated age 65: AT, [place], ears patent b/l and TM  neg on L, serous fluid present on R w/o erythema or bulging, nares patent w/o discharge, pharynx pink and without exudates, MMM Neck: No masses or asymmetry Heart: RRR Lungs: CTAB, no accessory muscle use Psych: Age appropriate judgment and insight, normal mood and affect  Acute bronchitis, unspecified organism - Plan: benzonatate (TESSALON) 100 MG capsule  Fluid level behind tympanic membrane of right ear - Plan: predniSONE (DELTASONE) 20 MG tablet  Prostate cancer (HCC)  5 d pred burst for ear, 40 mg/d. Tessalon perles for URI. Pocket rx for Zpak also rx'd, doubt he will need. If no improvement in 2-3 d, he will take.  Continue to push fluids, practice good hand hygiene, cover mouth when coughing. F/u prn. If starting to experience fevers, shaking, or shortness of breath, seek immediate care. Pt voiced understanding and agreement to the plan.  [place], DO 05/18/21 12:03 PM

## 2021-05-18 NOTE — Telephone Encounter (Signed)
Not w/o evaluating him. Can he be seen at 1145?

## 2021-05-18 NOTE — Telephone Encounter (Signed)
Called the patient and has a lot of congestion He has scheduled an appt for next week with PCP. He went to an UC this past Wednesday. They did do a covid and it was negative. He would llike to know if PCP would prescribe a zpac.

## 2021-05-18 NOTE — Telephone Encounter (Signed)
Nurse Assessment Nurse: Waymond Cera, RN, Benjamine Mola Date/Time (Eastern Time): 05/18/2021 8:05:08 AM Confirm and document reason for call. If symptomatic, describe symptoms. ---Caller states cough,sore throat, and congestion. States that phlegm feels like it is choking him. States negative for covid 2 days ago at [facility]. Does the patient have any new or worsening symptoms? ---Yes Will a triage be completed? ---Yes Related visit to physician within the last 2 weeks? ---Yes Does the PT have any chronic conditions? (i.e. diabetes, asthma, this includes High risk factors for pregnancy, etc.) ---Yes List chronic conditions. ---HTN Is this a behavioral health or substance abuse call? ---No Guidelines Guideline Title Affirmed Question Affirmed Notes Nurse Date/Time ([place] Time) COVID-19 - Diagnosed or Suspected [1] HIGH RISK for severe COVID complications (e.g., weak immune system, age > 26 years, obesity with BMI > 25, pregnant, chronic Cantrell, RN, Benjamine Mola 05/18/2021 8:06:33 AM PLEASE NOTE: All timestamps contained within this report are represented as [place] Standard Time. CONFIDENTIALTY NOTICE: This fax transmission is intended only for the addressee. It contains information that is legally privileged, confidential or otherwise protected from use or disclosure. If you are not the intended recipient, you are strictly prohibited from reviewing, disclosing, copying using or disseminating any of this information or taking any action in reliance on or regarding this information. If you have received this fax in error, please notify us immediately by telephone so that we can arrange for its return to [place]. Phone: [phone], Toll-Free: [phone], Fax: [phone] Page: 2 of 2 Call Id: [id number] Guidelines Guideline Title Affirmed Question Affirmed Notes Nurse Date/Time Eilene Ghazi Time) lung disease or other chronic medical condition) AND [2] COVID symptoms (e.g., cough,  fever) (Exceptions: Already seen by PCP and no new or worsening symptoms.) Disp. Time Eilene Ghazi Time) Disposition Final User 05/18/2021 8:02:19 AM Send to Urgent Queue Josephine Cables 05/18/2021 8:11:24 AM See HCP within 4 Hours (or PCP triage) Yes [place]rell, RN, Benjamine Mola Disposition Overriden: Call PCP Now Override Reason: Specify reason. (Please document in 'advice recommended' section) Caller Disagree/Comply Comply Caller Understands Yes PreDisposition Go to Urgent Care/Walk-In Clinic Care Advice Given Per Guideline SEE HCP (OR PCP TRIAGE) WITHIN 4 HOURS: * IF OFFICE WILL BE CLOSED AND NO PCP (PRIMARY CARE PROVIDER) SECOND-LEVEL TRIAGE: You need to be seen within the next 3 or 4 hours. A nearby Urgent Care Center [facility]) is often a good source of care. Another choice is to go to the ED. Go sooner if you become worse. CALL BACK IF: * You become worse CARE ADVICE given per COVID-19 - DIAGNOSED OR SUSPECTED (Adult) guideline. Comments User: Sharol Given, RN Date/Time Eilene Ghazi Time): 05/18/2021 8:11:21 AM Upgraded. Caller feels like he is choking on phelgm. Advised caller that he could try to call the office back as they may be open now. Advised if can't be seen in the office in next 4 hours to be seen in UC. Also advised to isolate,avoid visitors, and wear a mask when he does go to be seen. Verbalizes understanding. User: Sharol Given, RN Date/Time Eilene Ghazi Time): 05/18/2021 8:11:55 AM Also advised if no UC available to be seen in ER. Verbalizes understanding. Referrals REFERRED TO PCP OFFICE

## 2021-05-18 NOTE — Telephone Encounter (Signed)
Contacted and scheduled 

## 2021-05-23 ENCOUNTER — Encounter: Payer: Self-pay | Admitting: Family Medicine

## 2021-05-23 ENCOUNTER — Telehealth (INDEPENDENT_AMBULATORY_CARE_PROVIDER_SITE_OTHER): Payer: BC Managed Care – PPO | Admitting: Family Medicine

## 2021-05-23 ENCOUNTER — Other Ambulatory Visit: Payer: Self-pay

## 2021-05-23 DIAGNOSIS — R058 Other specified cough: Secondary | ICD-10-CM | POA: Diagnosis not present

## 2021-05-23 MED ORDER — PROMETHAZINE-DM 6.25-15 MG/5ML PO SYRP: 5.0000 mL | ORAL_SOLUTION | Freq: Every evening | ORAL | 0 refills | Status: DC | PRN

## 2021-05-23 MED ORDER — BENZONATATE 100 MG PO CAPS: 100.0000 mg | ORAL_CAPSULE | Freq: Three times a day (TID) | ORAL | 0 refills | Status: DC | PRN

## 2021-05-23 NOTE — Progress Notes (Signed)
Chief Complaint  Patient presents with   Nasal Congestion   Cough    Sleep problems at night due to cough    Darden Amber here for URI complaints. Due to COVID-19 pandemic, we are interacting via web portal for an electronic face-to-face visit. I verified patient's ID using 2 identifiers. Patient agreed to proceed with visit via this method. Patient is at home, I am at office. Patient and I are present for visit.   Tx'd for bronchitis, feels much better but has lingering cough. No fevers, sob, wheezing. He has some nasal congestion. Has been taking benzonatate and Nyquil w little relief.   Past Medical History:  Diagnosis Date   Allergy    Arthritis    Asthma    no inhalers   Diverticulitis 09/2016   seen in Urgent Care   Genital warts    History of chicken pox    History of hay fever    Hyperlipidemia 09/27/2016   Hypertension    Objective No conversational dyspnea Age appropriate judgment and insight Nml affect and mood  Post-viral cough syndrome - Plan: benzonatate (TESSALON) 100 MG capsule, promethazine-dextromethorphan (PROMETHAZINE-DM) 6.25-15 MG/5ML syrup  Resolving bronchitis s/s's. If no better in 2 d he will reach out and we will rx ICS.  F/u prn. If starting to experience fevers, shaking, or shortness of breath, seek immediate care. Pt voiced understanding and agreement to the plan.  [place], DO 05/23/21 10:08 AM

## 2021-06-11 DIAGNOSIS — C61 Malignant neoplasm of prostate: Secondary | ICD-10-CM | POA: Diagnosis not present

## 2021-06-21 DIAGNOSIS — C61 Malignant neoplasm of prostate: Secondary | ICD-10-CM | POA: Diagnosis not present

## 2021-06-21 DIAGNOSIS — N400 Enlarged prostate without lower urinary tract symptoms: Secondary | ICD-10-CM | POA: Diagnosis not present

## 2021-07-03 ENCOUNTER — Other Ambulatory Visit: Payer: Self-pay | Admitting: Family Medicine

## 2021-07-04 ENCOUNTER — Other Ambulatory Visit: Payer: Self-pay

## 2021-07-04 ENCOUNTER — Ambulatory Visit (INDEPENDENT_AMBULATORY_CARE_PROVIDER_SITE_OTHER): Payer: BC Managed Care – PPO | Admitting: Family Medicine

## 2021-07-04 ENCOUNTER — Encounter: Payer: Self-pay | Admitting: Family Medicine

## 2021-07-04 VITALS — BP 128/82 | HR 58 | Temp 97.5°F | Ht 68.0 in | Wt 203.5 lb

## 2021-07-04 DIAGNOSIS — Z0000 Encounter for general adult medical examination without abnormal findings: Secondary | ICD-10-CM | POA: Diagnosis not present

## 2021-07-04 LAB — COMPREHENSIVE METABOLIC PANEL
ALT: 25 U/L (ref 0–53)
AST: 25 U/L (ref 0–37)
Albumin: 4.2 g/dL (ref 3.5–5.2)
Alkaline Phosphatase: 37 U/L — ABNORMAL LOW (ref 39–117)
BUN: 14 mg/dL (ref 6–23)
CO2: 31 mEq/L (ref 19–32)
Calcium: 10 mg/dL (ref 8.4–10.5)
Chloride: 102 mEq/L (ref 96–112)
Creatinine, Ser: 0.87 mg/dL (ref 0.40–1.50)
GFR: 91.36 mL/min (ref >60.00)
Glucose, Bld: 89 mg/dL (ref 70–99)
Potassium: 3.9 mEq/L (ref 3.5–5.1)
Sodium: 141 mEq/L (ref 135–145)
Total Bilirubin: 1 mg/dL (ref 0.2–1.2)
Total Protein: 6.5 g/dL (ref 6.0–8.3)

## 2021-07-04 LAB — LIPID PANEL
Cholesterol: 155 mg/dL (ref 0–200)
HDL: 45.8 mg/dL (ref >39.00)
LDL Cholesterol: 91 mg/dL (ref 0–99)
NonHDL: 108.78
Total CHOL/HDL Ratio: 3
Triglycerides: 91 mg/dL (ref 0.0–149.0)
VLDL: 18.2 mg/dL (ref 0.0–40.0)

## 2021-07-04 LAB — CBC
HCT: 42.1 % (ref 39.0–52.0)
Hemoglobin: 14.4 g/dL (ref 13.0–17.0)
MCHC: 34.2 g/dL (ref 30.0–36.0)
MCV: 87.7 fl (ref 78.0–100.0)
Platelets: 162 10*3/uL (ref 150.0–400.0)
RBC: 4.79 Mil/uL (ref 4.22–5.81)
RDW: 13.5 % (ref 11.5–15.5)
WBC: 5.2 10*3/uL (ref 4.0–10.5)

## 2021-07-04 NOTE — Progress Notes (Addendum)
Chief Complaint  Patient presents with   Follow-up    6 month    Well Male Sean Bowers is here for a complete physical.   His last physical was >1 year ago.  Current diet: in general, a "healthy" diet.  Current exercise: walking, active at work Weight trend: stable recently Fatigue out of ordinary? No. Seat belt? Yes.    Health maintenance Shingrix- No Colonoscopy- Yes Tetanus- Yes HIV- Yes Hep C- Yes   Past Medical History:  Diagnosis Date   Allergy    Arthritis    Asthma    no inhalers   Diverticulitis 09/2016   seen in Urgent Care   Genital warts    History of chicken pox    History of hay fever    Hyperlipidemia 09/27/2016   Hypertension       Past Surgical History:  Procedure Laterality Date   CARPAL TUNNEL RELEASE Bilateral 2006-2007    Medications  Current Outpatient Medications on File Prior to Visit  Medication Sig Dispense Refill   aspirin 81 MG tablet Take 81 mg by mouth daily.     lisinopril-hydrochlorothiazide (ZESTORETIC) 20-25 MG tablet Take 1 tablet by mouth once daily 90 tablet 0   loratadine (CLARITIN) 10 MG tablet Take 10 mg by mouth daily.     Multiple Vitamins-Minerals (MULTIVITAMIN MEN PO) Take by mouth.     nitroGLYCERIN (NITROSTAT) 0.4 MG SL tablet Place 1 tablet (0.4 mg total) under the tongue every 5 (five) minutes x 3 doses as needed for chest pain. 25 tablet 1   rosuvastatin (CRESTOR) 20 MG tablet Take 1 tablet (20 mg total) by mouth daily. 90 tablet 3   tiZANidine (ZANAFLEX) 4 MG tablet Take 1 tablet (4 mg total) by mouth every 6 (six) hours as needed for muscle spasms. 21 tablet 0   Allergies Allergies  Allergen Reactions   Penicillins Swelling    Swelling of lips Has patient had a PCN reaction causing immediate rash, facial/tongue/throat swelling, SOB or lightheadedness with hypotension: Yes Has patient had a PCN reaction causing severe rash involving mucus membranes or skin necrosis: Yes Has patient had a PCN reaction  that required hospitalization: No Has patient had a PCN reaction occurring within the last 10 years: No If all of the above answers are "NO", then may proceed with Cephalosporin use.     Family History Family History  Problem Relation Age of Onset   Diabetes Mother    Heart disease Mother    Hypertension Mother    Breast cancer Mother    Arthritis Mother    Cancer Mother        breast   Hyperlipidemia Mother    Heart disease Father    Stroke Father    Arthritis Father    Cancer Father        prostate   Hyperlipidemia Father    Heart disease Paternal Grandmother    Cancer Maternal Uncle        lung   Cancer Paternal Aunt        lung   Cancer Paternal Uncle        lung    Review of Systems: Constitutional:  no fevers Eye:  no recent significant change in vision Ear/Nose/Mouth/Throat:  Ears:  no hearing loss Nose/Mouth/Throat:  no complaints of nasal congestion, no sore throat Cardiovascular:  no chest pain Respiratory:  no shortness of breath Gastrointestinal:  no change in bowel habits GU:  Male: negative for dysuria, frequency Musculoskeletal/Extremities:  no joint pain Integumentary (Skin/Breast):  no new abnormal skin lesions reported Neurologic:  no headaches Endocrine: No unexpected weight changes Hematologic/Lymphatic:  no abnormal bleeding  Exam BP 128/82   Pulse (!) 58   Temp (!) 97.5 F (36.4 C) (Oral)   Ht '5\' 8"'$  (1.727 m)   Wt 203 lb 8 oz (92.3 kg)   SpO2 99%   BMI 30.94 kg/m  General:  well developed, well nourished, in no apparent distress Skin: The area on his shoulder is slightly less uniform in color, border more irreg and slightly larger than pic taken last year; otherwise no significant moles, warts, or growths Head:  no masses, lesions, or tenderness Eyes:  pupils equal and round, sclera anicteric without injection Ears:  canals without lesions, TMs shiny without retraction, no obvious effusion, no erythema Nose:  nares patent, septum  midline, mucosa normal Throat/Pharynx:  lips and gingiva without lesion; tongue and uvula midline; non-inflamed pharynx; no exudates or postnasal drainage Neck: neck supple without adenopathy, thyromegaly, or masses Cardiac: RRR, no bruits, no LE edema Lungs:  clear to auscultation, breath sounds equal bilaterally, no respiratory distress Abdomen: BS+, soft, non-tender, non-distended, no masses or organomegaly noted Rectal: Deferred Musculoskeletal:  symmetrical muscle groups noted without atrophy or deformity Neuro:  gait normal; deep tendon reflexes normal and symmetric Psych: well oriented with normal range of affect and appropriate judgment/insight  Assessment and Plan  Well adult exam - Plan: CBC, Comprehensive metabolic panel, Lipid panel   Well 65 y.o. male. Counseled on diet and exercise. Pt w hx of prostate cancer followed by urology.  Shingrix rec'd. He will consider. Flu shot rec'd for Oct. Covid vaccine booster rec'd for Sept w updated version.  Skin lesion on shoulder appears to have changed over past year. Will plan for punch biopsy when convenient for him.  Immunizations, labs, and further orders as above. Follow up in 6 mo or prn. The patient voiced understanding and agreement to the plan.  [place], DO 07/04/21 8:32 AM

## 2021-07-04 NOTE — Patient Instructions (Addendum)
Give [place] 2-3 business days to get the results of your labs back.   Keep the diet clean and stay active.  I recommend getting the flu shot in mid October. This suggestion would change if the CDC comes out with a different recommendation.   The new Shingrix vaccine (for shingles) is a 2 shot series. It can make people feel low energy, achy and almost like they have the flu for 48 hours after injection. Please plan accordingly when deciding on when to get this shot. Call our office for a nurse visit appointment to get this. The second shot of the series is less severe regarding the side effects, but it still lasts 48 hours.   Consider arch support inserts for your shoes.   Let us know if you need anything.

## 2021-08-09 DIAGNOSIS — D075 Carcinoma in situ of prostate: Secondary | ICD-10-CM | POA: Diagnosis not present

## 2021-08-09 DIAGNOSIS — C61 Malignant neoplasm of prostate: Secondary | ICD-10-CM | POA: Diagnosis not present

## 2021-08-16 DIAGNOSIS — R3911 Hesitancy of micturition: Secondary | ICD-10-CM | POA: Diagnosis not present

## 2021-08-16 DIAGNOSIS — C61 Malignant neoplasm of prostate: Secondary | ICD-10-CM | POA: Diagnosis not present

## 2021-08-16 DIAGNOSIS — N401 Enlarged prostate with lower urinary tract symptoms: Secondary | ICD-10-CM | POA: Diagnosis not present

## 2021-09-27 ENCOUNTER — Ambulatory Visit: Payer: Self-pay | Admitting: Nurse Practitioner

## 2021-09-27 ENCOUNTER — Encounter: Payer: Self-pay | Admitting: Nurse Practitioner

## 2021-09-27 ENCOUNTER — Other Ambulatory Visit: Payer: Self-pay

## 2021-09-27 VITALS — BP 130/84 | HR 92 | Temp 100.1°F | Resp 16

## 2021-09-27 DIAGNOSIS — J40 Bronchitis, not specified as acute or chronic: Secondary | ICD-10-CM

## 2021-09-27 DIAGNOSIS — R509 Fever, unspecified: Secondary | ICD-10-CM

## 2021-09-27 DIAGNOSIS — Z20822 Contact with and (suspected) exposure to covid-19: Secondary | ICD-10-CM

## 2021-09-27 LAB — POC INFLUENZA A&B (BINAX/QUICKVUE)
Influenza A, POC: NEGATIVE
Influenza B, POC: NEGATIVE

## 2021-09-27 LAB — POC COVID19 BINAXNOW: SARS Coronavirus 2 Ag: NEGATIVE

## 2021-09-27 MED ORDER — AZITHROMYCIN 250 MG PO TABS: ORAL_TABLET | ORAL | 0 refills | Status: AC

## 2021-09-27 MED ORDER — BENZONATATE 100 MG PO CAPS
100.0000 mg | ORAL_CAPSULE | Freq: Three times a day (TID) | ORAL | 0 refills | Status: AC | PRN
Start: 2021-09-27 — End: 2021-10-07

## 2021-09-27 NOTE — Progress Notes (Signed)
Subjective:    Patient ID: Sean Bowers, male    DOB: [birth year], 65 y.o.   MRN: [id number]  HPI  65 year old male presenting to [facility] with complaints of a productive cough  that onset yesterday. 101 fever after taking ibuprofen today.    He did have some left over benzonatate that he tried this morning with some relief.   No flu vaccine this year  Has had a COVID vaccine in the past with booster   He has had exposure to children with similar symptoms.   Today's Vitals   09/27/21 0933  BP: 130/84  Pulse: 92  Resp: 16  Temp: 100.1 F (37.8 C)  TempSrc: Tympanic  SpO2: 98%   There is no height or weight on file to calculate BMI.    Past Medical History:  Diagnosis Date   Allergy    Arthritis    Asthma    no inhalers   Diverticulitis 09/2016   seen in Urgent Care   Genital warts    History of chicken pox    History of hay fever    Hyperlipidemia 09/27/2016   Hypertension      Allergies  Allergen Reactions   Penicillins Swelling    Swelling of lips Has patient had a PCN reaction causing immediate rash, facial/tongue/throat swelling, SOB or lightheadedness with hypotension: Yes Has patient had a PCN reaction causing severe rash involving mucus membranes or skin necrosis: Yes Has patient had a PCN reaction that required hospitalization: No Has patient had a PCN reaction occurring within the last 10 years: No If all of the above answers are "NO", then may proceed with Cephalosporin use.      Review of Systems  Constitutional:  Positive for fever.  HENT:  Positive for congestion and sinus pressure.   Eyes: Negative.   Respiratory:  Positive for cough.   Cardiovascular: Negative.   Gastrointestinal: Negative.   Genitourinary: Negative.   Musculoskeletal: Negative.   Neurological: Negative.       Objective:   Physical Exam Constitutional:      Appearance: Normal appearance.  HENT:     Head: Normocephalic.     Right Ear:  Tympanic membrane, ear canal and external ear normal.     Left Ear: Tympanic membrane, ear canal and external ear normal.     Nose: Congestion present.     Mouth/Throat:     Mouth: Mucous membranes are moist.  Eyes:     Pupils: Pupils are equal, round, and reactive to light.  Cardiovascular:     Rate and Rhythm: Normal rate and regular rhythm.     Heart sounds: Normal heart sounds.  Pulmonary:     Effort: Pulmonary effort is normal.     Breath sounds: Rhonchi present.  Musculoskeletal:        General: Normal range of motion.     Cervical back: Normal range of motion.  Skin:    General: Skin is warm.  Neurological:     General: No focal deficit present.     Mental Status: He is alert.    Recent Results (from the past 2160 hour(s))  CBC     Status: None   Collection Time: 07/04/21  8:33 AM  Result Value Ref Range   WBC 5.2 4.0 - 10.5 K/uL   RBC 4.79 4.22 - 5.81 Mil/uL   Platelets 162.0 150.0 - 400.0 K/uL   Hemoglobin 14.4 13.0 - 17.0 g/dL   HCT 42.1 39.0 -  52.0 %   MCV 87.7 78.0 - 100.0 fl   MCHC 34.2 30.0 - 36.0 g/dL   RDW 13.5 11.5 - 15.5 %  Comprehensive metabolic panel     Status: Abnormal   Collection Time: 07/04/21  8:33 AM  Result Value Ref Range   Sodium 141 135 - 145 mEq/L   Potassium 3.9 3.5 - 5.1 mEq/L   Chloride 102 96 - 112 mEq/L   CO2 31 19 - 32 mEq/L   Glucose, Bld 89 70 - 99 mg/dL   BUN 14 6 - 23 mg/dL   Creatinine, Ser 0.87 0.40 - 1.50 mg/dL   Total Bilirubin 1.0 0.2 - 1.2 mg/dL   Alkaline Phosphatase 37 (L) 39 - 117 U/L   AST 25 0 - 37 U/L   ALT 25 0 - 53 U/L   Total Protein 6.5 6.0 - 8.3 g/dL   Albumin 4.2 3.5 - 5.2 g/dL   GFR 91.36 >60.00 mL/min    Comment: Calculated using the CKD-EPI Creatinine Equation (2021)   Calcium 10.0 8.4 - 10.5 mg/dL  Lipid panel     Status: None   Collection Time: 07/04/21  8:33 AM  Result Value Ref Range   Cholesterol 155 0 - 200 mg/dL    Comment: ATP III Classification       Desirable:  < 200 mg/dL                Borderline High:  200 - 239 mg/dL          High:  > = 240 mg/dL   Triglycerides 91.0 0.0 - 149.0 mg/dL    Comment: Normal:  <150 mg/dLBorderline High:  150 - 199 mg/dL   HDL 45.80 >39.00 mg/dL   VLDL 18.2 0.0 - 40.0 mg/dL   LDL Cholesterol 91 0 - 99 mg/dL   Total CHOL/HDL Ratio 3     Comment:                Men          Women1/2 Average Risk     3.4          3.3Average Risk          5.0          4.42X Average Risk          9.6          7.13X Average Risk          15.0          11.0                       NonHDL 108.78     Comment: NOTE:  Non-HDL goal should be 30 mg/dL higher than patient's LDL goal (i.e. LDL goal of < 70 mg/dL, would have non-HDL goal of < 100 mg/dL)  POC Influenza A&B(BINAX/QUICKVUE)     Status: Normal   Collection Time: 09/27/21  9:53 AM  Result Value Ref Range   Influenza A, POC Negative Negative   Influenza B, POC Negative Negative  POC COVID-19     Status: Normal   Collection Time: 09/27/21  9:54 AM  Result Value Ref Range   SARS Coronavirus 2 Ag Negative Negative    Comment: Patient aware of POC results. Has appt with Marigene Ehlers FNP         Assessment & Plan:  1. Encounter for screening laboratory testing for COVID-19 virus  - POC COVID-19 (negative)  2. Fever, unspecified fever cause  - POC Influenza A&B(BINAX/QUICKVUE) (negative)   3. Bronchitis  - azithromycin (ZITHROMAX) 250 MG tablet; Take 2 tablets on day 1, then 1 tablet daily on days 2 through 5  Dispense: 6 tablet; Refill: 0  - benzonatate (TESSALON) 100 MG capsule; Take 1 capsule (100 mg total) by mouth 3 (three) times daily as needed for up to 10 days for cough.  Dispense: 30 capsule; Refill: 0    Push fluids for relief. RTC if symptoms persist or with new concerns.  Stay out of work until fever free for 24 hours.  Seek medical attention for any acutely worsening symptoms

## 2021-10-05 ENCOUNTER — Encounter: Payer: Self-pay | Admitting: Nurse Practitioner

## 2021-10-05 ENCOUNTER — Ambulatory Visit: Payer: BC Managed Care – PPO | Admitting: Nurse Practitioner

## 2021-10-05 ENCOUNTER — Other Ambulatory Visit: Payer: Self-pay

## 2021-10-05 DIAGNOSIS — J4521 Mild intermittent asthma with (acute) exacerbation: Secondary | ICD-10-CM

## 2021-10-05 DIAGNOSIS — J40 Bronchitis, not specified as acute or chronic: Secondary | ICD-10-CM

## 2021-10-05 MED ORDER — ALBUTEROL SULFATE HFA 108 (90 BASE) MCG/ACT IN AERS: 2.0000 | INHALATION_SPRAY | Freq: Four times a day (QID) | RESPIRATORY_TRACT | 0 refills | Status: DC | PRN

## 2021-10-05 MED ORDER — BENZONATATE 100 MG PO CAPS
100.0000 mg | ORAL_CAPSULE | Freq: Three times a day (TID) | ORAL | 0 refills | Status: DC | PRN
Start: 2021-10-05 — End: 2021-10-08

## 2021-10-05 NOTE — Progress Notes (Signed)
Patient called, he was treated for bronchitis on 09/27/21. Overall feeling better but has ongoing dry cough. He has a history of asthma, has not used an inhaler in the pat 2 years.   Denies SOB or distress  Speech is clear over the phone, patient is oriented and calm  Discussed restarting inhaler to help overcome end of bronchial illness, he may continue benzonatate for cough support as well.  If cough persists or with any worsening symptoms he should follow up at the clinic as discussed  Meds ordered this encounter  Medications   benzonatate (TESSALON) 100 MG capsule    Sig: Take 1 capsule (100 mg total) by mouth 3 (three) times daily as needed for up to 7 days for cough.    Dispense:  30 capsule    Refill:  0   albuterol (VENTOLIN HFA) 108 (90 Base) MCG/ACT inhaler    Sig: Inhale 2 puffs into the lungs every 6 (six) hours as needed for up to 7 days for wheezing or shortness of breath.    Dispense:  8 g    Refill:  0

## 2021-10-07 ENCOUNTER — Other Ambulatory Visit: Payer: Self-pay | Admitting: Family Medicine

## 2021-10-08 ENCOUNTER — Other Ambulatory Visit: Payer: Self-pay

## 2021-10-08 ENCOUNTER — Encounter: Payer: Self-pay | Admitting: Family Medicine

## 2021-10-08 ENCOUNTER — Ambulatory Visit: Payer: BC Managed Care – PPO | Admitting: Family Medicine

## 2021-10-08 VITALS — BP 128/80 | HR 71 | Temp 98.4°F | Ht 68.0 in | Wt 212.2 lb

## 2021-10-08 DIAGNOSIS — R051 Acute cough: Secondary | ICD-10-CM | POA: Diagnosis not present

## 2021-10-08 MED ORDER — HYDROCODONE BIT-HOMATROP MBR 5-1.5 MG/5ML PO SOLN
5.0000 mL | Freq: Three times a day (TID) | ORAL | 0 refills | Status: DC | PRN
Start: 2021-10-08 — End: 2022-01-07

## 2021-10-08 MED ORDER — DOXYCYCLINE HYCLATE 100 MG PO TABS: 100.0000 mg | ORAL_TABLET | Freq: Two times a day (BID) | ORAL | 0 refills | Status: AC

## 2021-10-08 NOTE — Patient Instructions (Signed)
Continue to push fluids, practice good hand hygiene, and cover your mouth if you cough.  If you start having fevers, shaking or shortness of breath, seek immediate care.  Send me a message over the weekend if we aren't turning the corner and we will check an X-ray of your lungs.  Let us know if you need anything.

## 2021-10-08 NOTE — Progress Notes (Signed)
Chief Complaint  Patient presents with   Follow-up    Congestion     Sean Bowers here for URI complaints.  Duration: 2 weeks  Associated symptoms: chest tightness, slight sob, and coughing Denies: sinus congestion, sinus pain, rhinorrhea, itchy watery eyes, ear pain, ear drainage, sore throat, wheezing, myalgia, and fevers Treatment to date: Tessalon Perles, SABA, Zpak Sick contacts: No Tested neg for covid and flu.   Past Medical History:  Diagnosis Date   Allergy    Arthritis    Asthma    no inhalers   Diverticulitis 09/2016   seen in Urgent Care   Genital warts    History of chicken pox    History of hay fever    Hyperlipidemia 09/27/2016   Hypertension     Objective BP 128/80   Pulse 71   Temp 98.4 F (36.9 C) (Oral)   Ht 5\' 8"  (1.727 m)   Wt 212 lb 4 oz (96.3 kg)   SpO2 93%   BMI 32.27 kg/m  General: Awake, alert, appears stated age HEENT: AT, [place], ears patent b/l and TM's neg, nares patent w/o discharge, pharynx pink and without exudates, MMM Neck: No masses or asymmetry Heart: RRR Lungs: CTAB, no accessory muscle use Psych: Age appropriate judgment and insight, normal mood and affect  Acute cough - Plan: HYDROcodone bit-homatropine (HYCODAN) 5-1.5 MG/5ML syrup, doxycycline (VIBRA-TABS) 100 MG tablet  Continue to push fluids, practice good hand hygiene, cover mouth when coughing. F/u prn. If starting to experience fevers, shaking, or shortness of breath, seek immediate care. Pt voiced understanding and agreement to the plan.  [place], DO 10/08/21 3:38 PM

## 2021-12-21 ENCOUNTER — Ambulatory Visit: Payer: BC Managed Care – PPO | Admitting: Nurse Practitioner

## 2021-12-21 ENCOUNTER — Other Ambulatory Visit: Payer: Self-pay

## 2021-12-21 VITALS — BP 120/70 | HR 70 | Temp 98.6°F | Resp 16

## 2021-12-21 DIAGNOSIS — R059 Cough, unspecified: Secondary | ICD-10-CM

## 2021-12-21 MED ORDER — BENZONATATE 100 MG PO CAPS: 100.0000 mg | ORAL_CAPSULE | Freq: Three times a day (TID) | ORAL | 0 refills | Status: DC | PRN

## 2021-12-21 NOTE — Progress Notes (Signed)
° °  Subjective:    Patient ID: Sean Bowers, male    DOB: 1956/04/21, 66 y.o.   MRN: [id number]  HPI  66 year old male presenting to [facility] with complaints of an ongoing dry cough since having COVID 2 weeks ago. The patient had left over Benzonatate that he has been using to help with cough. Denies any recent fever. Denies SOB. Denies need for inhaler. He is back at work and able to do activities normally without SOB. Notices his cough mostly at night. Denies sinus congestion or other systemic symptoms   He also had bronchitis in November of last year. Was seen multiple times both at this clinic and at his PCP. Was restarted on his Albuterol at that time, used benzonatate and was also given doxycyline.   He denies a tobacco history. 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 He had a chest CT and Xray most recently in 2018 without note of disease  He had Asthma as a child and needed inhalers at that time but has since grown out of need for asthma management aside from recent URIs  Today's Vitals   12/21/21 1048  BP: 120/70  Pulse: 70  Resp: 16  Temp: 98.6 F (37 C)  TempSrc: Tympanic  SpO2: 96%   There is no height or weight on file to calculate BMI.   Review of Systems  Constitutional: Negative.   HENT: Negative.    Respiratory:  Positive for cough.   Cardiovascular: Negative.   Genitourinary: Negative.   Musculoskeletal: Negative.   Neurological: Negative.   Hematological: Negative.   Psychiatric/Behavioral: Negative.        Objective:   Physical Exam Constitutional:      Appearance: Normal appearance.  HENT:     Head: Normocephalic.     Nose: Nose normal.     Mouth/Throat:     Mouth: Mucous membranes are moist.  Eyes:     Pupils: Pupils are equal, round, and reactive to light.  Cardiovascular:     Rate and Rhythm: Normal rate and regular rhythm.     Heart sounds: Normal heart sounds.  Pulmonary:      Effort: Pulmonary effort is normal.     Breath sounds: Normal breath sounds.  Musculoskeletal:        General: Normal range of motion.     Cervical back: Normal range of motion.  Skin:    General: Skin is warm.     Capillary Refill: Capillary refill takes less than 2 seconds.  Neurological:     General: No focal deficit present.     Mental Status: He is alert.  Psychiatric:        Mood and Affect: Mood normal.          Assessment & Plan:  1. Cough, unspecified type  Will refill benzonatate for patient  Instructed to initiate Albuterol use with any wheezing associated with cough RTC if cough becomes productive, with fever or other new symptoms or concerns   Discussed Chest XRAY with PCP at next appointment if cough persists due to recurrent history of prolonged coughs with URIs   Meds ordered this encounter  Medications   benzonatate (TESSALON) 100 MG capsule    Sig: Take 1 capsule (100 mg total) by mouth 3 (three) times daily as needed for cough.    Dispense:  30 capsule    Refill:  0

## 2022-01-06 ENCOUNTER — Other Ambulatory Visit: Payer: Self-pay | Admitting: Family Medicine

## 2022-01-07 ENCOUNTER — Ambulatory Visit: Payer: BC Managed Care – PPO | Admitting: Family Medicine

## 2022-01-07 ENCOUNTER — Encounter: Payer: Self-pay | Admitting: Family Medicine

## 2022-01-07 VITALS — BP 112/68 | HR 63 | Temp 97.8°F | Ht 68.0 in | Wt 213.5 lb

## 2022-01-07 DIAGNOSIS — I872 Venous insufficiency (chronic) (peripheral): Secondary | ICD-10-CM

## 2022-01-07 DIAGNOSIS — I8391 Asymptomatic varicose veins of right lower extremity: Secondary | ICD-10-CM | POA: Diagnosis not present

## 2022-01-07 DIAGNOSIS — G8929 Other chronic pain: Secondary | ICD-10-CM

## 2022-01-07 DIAGNOSIS — R053 Chronic cough: Secondary | ICD-10-CM

## 2022-01-07 DIAGNOSIS — M25511 Pain in right shoulder: Secondary | ICD-10-CM

## 2022-01-07 DIAGNOSIS — L989 Disorder of the skin and subcutaneous tissue, unspecified: Secondary | ICD-10-CM

## 2022-01-07 DIAGNOSIS — E7800 Pure hypercholesterolemia, unspecified: Secondary | ICD-10-CM | POA: Diagnosis not present

## 2022-01-07 DIAGNOSIS — I10 Essential (primary) hypertension: Secondary | ICD-10-CM

## 2022-01-07 DIAGNOSIS — M25512 Pain in left shoulder: Secondary | ICD-10-CM

## 2022-01-07 DIAGNOSIS — F5104 Psychophysiologic insomnia: Secondary | ICD-10-CM

## 2022-01-07 DIAGNOSIS — G4700 Insomnia, unspecified: Secondary | ICD-10-CM | POA: Insufficient documentation

## 2022-01-07 LAB — LIPID PANEL
Cholesterol: 129 mg/dL (ref 0–200)
HDL: 52.4 mg/dL (ref >39.00)
LDL Cholesterol: 63 mg/dL (ref 0–99)
NonHDL: 76.71
Total CHOL/HDL Ratio: 2
Triglycerides: 70 mg/dL (ref 0.0–149.0)
VLDL: 14 mg/dL (ref 0.0–40.0)

## 2022-01-07 LAB — COMPREHENSIVE METABOLIC PANEL
ALT: 25 U/L (ref 0–53)
AST: 25 U/L (ref 0–37)
Albumin: 4.5 g/dL (ref 3.5–5.2)
Alkaline Phosphatase: 36 U/L — ABNORMAL LOW (ref 39–117)
BUN: 12 mg/dL (ref 6–23)
CO2: 33 mEq/L — ABNORMAL HIGH (ref 19–32)
Calcium: 9.7 mg/dL (ref 8.4–10.5)
Chloride: 103 mEq/L (ref 96–112)
Creatinine, Ser: 0.88 mg/dL (ref 0.40–1.50)
GFR: 90.72 mL/min (ref >60.00)
Glucose, Bld: 101 mg/dL — ABNORMAL HIGH (ref 70–99)
Potassium: 4 mEq/L (ref 3.5–5.1)
Sodium: 140 mEq/L (ref 135–145)
Total Bilirubin: 1.3 mg/dL — ABNORMAL HIGH (ref 0.2–1.2)
Total Protein: 6.9 g/dL (ref 6.0–8.3)

## 2022-01-07 MED ORDER — TRAZODONE HCL 50 MG PO TABS
25.0000 mg | ORAL_TABLET | Freq: Every evening | ORAL | 3 refills | Status: DC | PRN
Start: 2022-01-07 — End: 2022-05-06

## 2022-01-07 NOTE — Patient Instructions (Addendum)
Give [place] 2-3 business days to get the results of your labs back.   Keep the diet clean and stay active.  Focus on your home exercise program for your flexibility.   Let me know if you would like to see a vascular surgeon for the varicose veins.    Ice/cold pack over area for 10-15 min twice daily.  Heat (pad or rice pillow in microwave) over affected area, 10-15 minutes twice daily.   OK to take Tylenol 1000 mg (2 extra strength tabs) or 975 mg (3 regular strength tabs) every 6 hours as needed.  Sleep Hygiene Tips: Do not watch TV or look at screens within 1 hour of going to bed. If you do, make sure there is a blue light filter (nighttime mode) involved. Try to go to bed around the same time every night. Wake up at the same time within 1 hour of regular time. Ex: If you wake up at 7 AM for work, do not sleep past 8 AM on days that you don't work. Do not drink alcohol before bedtime. Do not consume caffeine-containing beverages after noon or within 9 hours of intended bedtime. Get regular exercise/physical activity in your life, but not within 2 hours of planned bedtime. Do not take naps.  Do not eat within 2 hours of planned bedtime. Melatonin, 3-5 mg 30-60 minutes before planned bedtime may be helpful.  The bed should be for sleep or sex only. If after 20-30 minutes you are unable to fall asleep, get up and do something relaxing. Do this until you feel ready to go to sleep again.   Sleep is important to [place] all. Getting good sleep is imperative to adequate functioning during the day. Work with our counselors who are trained to help people obtain quality sleep. Call [phone] to schedule an appointment or if you are curious about insurance coverage/cost.  Let us know if you need anything.  EXERCISES  RANGE OF MOTION (ROM) AND STRETCHING EXERCISES These exercises may help you when beginning to rehabilitate your injury. While completing these exercises, remember:  Restoring tissue  flexibility helps normal motion to return to the joints. This allows healthier, less painful movement and activity. An effective stretch should be held for at least 30 seconds. A stretch should never be painful. You should only feel a gentle lengthening or release in the stretched tissue.  ROM - Pendulum Bend at the waist so that your right / left arm falls away from your body. Support yourself with your opposite hand on a solid surface, such as a table or a countertop. Your right / left arm should be perpendicular to the ground. If it is not perpendicular, you need to lean over farther. Relax the muscles in your right / left arm and shoulder as much as possible. Gently sway your hips and trunk so they move your right / left arm without any use of your right / left shoulder muscles. Progress your movements so that your right / left arm moves side to side, then forward and backward, and finally, both clockwise and counterclockwise. Complete 10-15 repetitions in each direction. Many people use this exercise to relieve discomfort in their shoulder as well as to gain range of motion. Repeat 2 times. Complete this exercise 3 times per week.  STRETCH - Flexion, Standing Stand with good posture. With an underhand grip on your right / left hand and an overhand grip on the opposite hand, grasp a broomstick or cane so that your hands  are a little more than shoulder-width apart. Keeping your right / left elbow straight and shoulder muscles relaxed, push the stick with your opposite hand to raise your right / left arm in front of your body and then overhead. Raise your arm until you feel a stretch in your right / left shoulder, but before you have increased shoulder pain. Try to avoid shrugging your right / left shoulder as your arm rises by keeping your shoulder blade tucked down and toward your mid-back spine. Hold 30 seconds. Slowly return to the starting position. Repeat 2 times. Complete this exercise 3  times per week.  STRETCH - Internal Rotation Place your right / left hand behind your back, palm-up. Throw a towel or belt over your opposite shoulder. Grasp the towel/belt with your right / left hand. While keeping an upright posture, gently pull up on the towel/belt until you feel a stretch in the front of your right / left shoulder. Avoid shrugging your right / left shoulder as your arm rises by keeping your shoulder blade tucked down and toward your mid-back spine. Hold 30. Release the stretch by lowering your opposite hand. Repeat 2 times. Complete this exercise 3 times per week.  STRETCH - External Rotation and Abduction Stagger your stance through a doorframe. It does not matter which foot is forward. As instructed by your physician, physical therapist or athletic trainer, place your hands: And forearms above your head and on the door frame. And forearms at head-height and on the door frame. At elbow-height and on the door frame. Keeping your head and chest upright and your stomach muscles tight to prevent over-extending your low-back, slowly shift your weight onto your front foot until you feel a stretch across your chest and/or in the front of your shoulders. Hold 30 seconds. Shift your weight to your back foot to release the stretch. Repeat 2 times. Complete this stretch 3 times per week.   STRENGTHENING EXERCISES  These exercises may help you when beginning to rehabilitate your injury. They may resolve your symptoms with or without further involvement from your physician, physical therapist or athletic trainer. While completing these exercises, remember:  Muscles can gain both the endurance and the strength needed for everyday activities through controlled exercises. Complete these exercises as instructed by your physician, physical therapist or athletic trainer. Progress the resistance and repetitions only as guided. You may experience muscle soreness or fatigue, but the pain or  discomfort you are trying to eliminate should never worsen during these exercises. If this pain does worsen, stop and make certain you are following the directions exactly. If the pain is still present after adjustments, discontinue the exercise until you can discuss the trouble with your clinician. If advised by your physician, during your recovery, avoid activity or exercises which involve actions that place your right / left hand or elbow above your head or behind your back or head. These positions stress the tissues which are trying to heal.  STRENGTH - Scapular Depression and Adduction With good posture, sit on a firm chair. Supported your arms in front of you with pillows, arm rests or a table top. Have your elbows in line with the sides of your body. Gently draw your shoulder blades down and toward your mid-back spine. Gradually increase the tension without tensing the muscles along the top of your shoulders and the back of your neck. Hold for 3 seconds. Slowly release the tension and relax your muscles completely before completing the next repetition. After  you have practiced this exercise, remove the arm support and complete it in standing as well as sitting. Repeat 2 times. Complete this exercise 3 times per week.   STRENGTH - External Rotators Secure a rubber exercise band/tubing to a fixed object so that it is at the same height as your right / left elbow when you are standing or sitting on a firm surface. Stand or sit so that the secured exercise band/tubing is at your side that is not injured. Bend your elbow 90 degrees. Place a folded towel or small pillow under your right / left arm so that your elbow is a few inches away from your side. Keeping the tension on the exercise band/tubing, pull it away from your body, as if pivoting on your elbow. Be sure to keep your body steady so that the movement is only coming from your shoulder rotating. Hold 3 seconds. Release the tension in a  controlled manner as you return to the starting position. Repeat 2 times. Complete this exercise 3 times per week.   STRENGTH - Supraspinatus Stand or sit with good posture. Grasp a 2-3 lb weight or an exercise band/tubing so that your hand is "thumbs-up," like when you shake hands. Slowly lift your right / left hand from your thigh into the air, traveling about 30 degrees from straight out at your side. Lift your hand to shoulder height or as far as you can without increasing any shoulder pain. Initially, many people do not lift their hands above shoulder height. Avoid shrugging your right / left shoulder as your arm rises by keeping your shoulder blade tucked down and toward your mid-back spine. Hold for 3 seconds. Control the descent of your hand as you slowly return to your starting position. Repeat 2 times. Complete this exercise 3 times per week.   STRENGTH - Shoulder Extensors Secure a rubber exercise band/tubing so that it is at the height of your shoulders when you are either standing or sitting on a firm arm-less chair. With a thumbs-up grip, grasp an end of the band/tubing in each hand. Straighten your elbows and lift your hands straight in front of you at shoulder height. Step back away from the secured end of band/tubing until it becomes tense. Squeezing your shoulder blades together, pull your hands down to the sides of your thighs. Do not allow your hands to go behind you. Hold for 3 seconds. Slowly ease the tension on the band/tubing as you reverse the directions and return to the starting position. Repeat 2 times. Complete this exercise 3 times per week.   STRENGTH - Scapular Retractors Secure a rubber exercise band/tubing so that it is at the height of your shoulders when you are either standing or sitting on a firm arm-less chair. With a palm-down grip, grasp an end of the band/tubing in each hand. Straighten your elbows and lift your hands straight in front of you at shoulder  height. Step back away from the secured end of band/tubing until it becomes tense. Squeezing your shoulder blades together, draw your elbows back as you bend them. Keep your upper arm lifted away from your body throughout the exercise. Hold 3 seconds. Slowly ease the tension on the band/tubing as you reverse the directions and return to the starting position. Repeat 2 times. Complete this exercise 3 times per week.  STRENGTH - Scapular Depressors Find a sturdy chair without wheels, such as a from a dining room table. Keeping your feet on the floor, lift your  bottom from the seat and lock your elbows. Keeping your elbows straight, allow gravity to pull your body weight down. Your shoulders will rise toward your ears. Raise your body against gravity by drawing your shoulder blades down your back, shortening the distance between your shoulders and ears. Although your feet should always maintain contact with the floor, your feet should progressively support less body weight as you get stronger. Hold 3 seconds. In a controlled and slow manner, lower your body weight to begin the next repetition. Repeat 2 times. Complete this exercise 3 times per week.    This information is not intended to replace advice given to you by your health care provider. Make sure you discuss any questions you have with your health care provider.   Document Released: 09/18/2005 Document Revised: 11/25/2014 Document Reviewed: 02/16/2009 Elsevier Interactive Patient Education [employer].

## 2022-01-07 NOTE — Progress Notes (Signed)
Chief Complaint  Patient presents with   Follow-up    Subjective Sean Bowers is a 66 y.o. male who presents for hypertension follow up. He does not monitor home blood pressures. He is compliant with medications. Patient has these side effects of medication: none He is sometimes adhering to a healthy diet overall. Current exercise: active at home/work No Cp or SOB.  Hyperlipidemia Patient presents for dyslipidemia follow up. Currently being treated with Crestor 20 mg/d and compliance with treatment thus far has been good. He denies myalgias. Diet/exercise as above The patient is not known to have coexisting coronary artery disease.  Varicose veins Nonpainful varicose veins mainly on R side over the past 3 months. Starting to stretch his shoes because it is getting tighter. Had a procedure done around 8 years ago. Has been using compressing stockings from his previous vascular surgeon in [place]. Has been wearing since the procedure.    Shoulder pain The patient has a history of bilateral shoulder pain over the tops of both shoulders.  No injury or change in activity.  The left side was bothersome a couple years ago and then recently the right side started to hurt.  The left side is still worse with decreased range of motion.  He is not having any bruising, swelling, numbness/tingling, weakness, or redness.  He was given stretches and exercises in the past but has not been doing them as often as he probably should.  Insomnia The patient will sometimes have difficulty falling asleep and certainly will wake up with melanite and it would take 15 to 20 minutes to fall back asleep.  He has been self-medicating with NyQuil.  He tried melatonin without relief.  He will have racing thoughts or ideas of what he will need to do for work or his side gigs.  He does not describe himself as an anxious person.  He has never been on any prescription medication.  He is not interested in cognitive behavioral  therapy.  No depression.  Skin lesion The patient has a lesion on his left shoulder that is dark.  He does not feel that it is growing or changing colors.  There is no pain, drainage, or itching.  This area is not a commonly sun-exposed area for him.   Past Medical History:  Diagnosis Date   Allergy    Arthritis    Asthma    no inhalers   Diverticulitis 09/2016   seen in Urgent Care   Genital warts    History of chicken pox    History of hay fever    Hyperlipidemia 09/27/2016   Hypertension     Exam BP 112/68    Pulse 63    Temp 97.8 F (36.6 C) (Oral)    Ht 5\' 8"  (1.727 m)    Wt 213 lb 8 oz (96.8 kg)    SpO2 93%    BMI 32.46 kg/m  General:  well developed, well nourished, in no apparent distress Skin: See below Heart: RRR, no bruits, no LE edema Lungs: clear to auscultation, no accessory muscle use Shoulders: Decreased passive and active range of motion on the left, normal on the right.  No deformity, swelling, or ecchymosis.  Positive Neer's on the left.  Negative Hawkins, speeds, liftoff, crossover, O'Briens, empty can bilaterally Neuro: Speech fluent and goal oriented, gait is normal, DTRs equal and symmetric in the upper extremities bilaterally without clonus. Psych: well oriented with normal range of affect and appropriate judgment/insight  Essential hypertension  Pure hypercholesterolemia - Plan: Comprehensive metabolic panel, Lipid panel  Venous insufficiency  Asymptomatic varicose veins of right lower extremity  Chronic pain of both shoulders  Chronic cough  Psychophysiological insomnia - Plan: traZODone (DESYREL) 50 MG tablet  Skin lesion  Chronic, stable.  Continue Zestoretic 20-25 milligrams daily.  Counseled on diet and exercise. Chronic, stable.  Check labs, continue Crestor 20 mg daily. Continue compression stockings.  Elevate legs when able. Offered referral to vascular surgery which he politely declined at this time as he does not have any  sores or pain.  He will let me know if anything changes. Stretches and exercises given.  Could consider physical therapy which he likely will not like.  Could consider injection on the left. This is not that bothersome for him.  I offered to change his blood pressure medicine as lisinopril can cause a chronic cough.  He politely declined and said that cough is not bothersome and he does not want to do anything that could affect his blood pressure. Counseling information provided in his paperwork in addition to sleep hygiene tips.  We will start trazodone 25-50 mg nightly as needed.  Try to wean himself off of the NyQuil. F/u in 6 weeks to recheck this. We will continue to monitor.  Does not appear to be changing or growing but we could take a punch biopsy to ensure nothing sinister is growing. The patient and his spouse voiced understanding and agreement to the plan.  I spent 45 minutes with the patient and his spouse discussing the above plan in addition to reviewing his chart on the same day of the visit.  [place], DO 01/07/22  10:57 AM

## 2022-01-09 ENCOUNTER — Telehealth: Payer: Self-pay

## 2022-01-09 NOTE — Telephone Encounter (Signed)
Nurse Assessment Nurse: Gildardo Pounds, RN, Amy Date/Time Eilene Ghazi Time): 01/09/2022 9:15:38 AM Confirm and document reason for call. If symptomatic, describe symptoms. ---Caller states he has congestion & a cough that is productive of yellow sputum. It started last night & got worse through the night. No fever. Covid test is negative this morning. He had Covid a month ago. He has a little achiness & sore throat. His sinuses are a little puffy & tender. Does the patient have any new or worsening symptoms? ---Yes Will a triage be completed? ---Yes Related visit to physician within the last 2 weeks? ---No Does the PT have any chronic conditions? (i.e. diabetes, asthma, this includes High risk factors for pregnancy, etc.) ---Yes List chronic conditions. ---HTN Is this a behavioral health or substance abuse call? ---No Guidelines Guideline Title Affirmed Question Affirmed Notes Nurse Date/Time (Eastern Time) Influenza - Seasonal [1] Probable mild influenza (no fever) or a common cold, with no complications AND [6] NOT HIGH RISK Lovelace, RN, Amy 01/09/2022 9:18:02 AM PLEASE NOTE: All timestamps contained within this report are represented as [place] Standard Time. CONFIDENTIALTY NOTICE: This fax transmission is intended only for the addressee. It contains information that is legally privileged, confidential or otherwise protected from use or disclosure. If you are not the intended recipient, you are strictly prohibited from reviewing, disclosing, copying using or disseminating any of this information or taking any action in reliance on or regarding this information. If you have received this fax in error, please notify us immediately by telephone so that we can arrange for its return to [place]. Phone: [phone], Toll-Free: [phone], Fax: [phone] Page: 2 of 2 Call Id: [id number] [place]. Time Eilene Ghazi Time) Disposition Final User 01/09/2022 8:59:20 AM Send To Nurse Ria Comment, RN,  April 01/09/2022 9:23:31 AM Ryan, RN, Amy Caller Disagree/Comply Comply Caller Understands Yes PreDisposition InappropriateToAsk Care Advice Given Per Guideline HOME CARE: * You should be able to treat this at home. REASSURANCE AND EDUCATION - INFLUENZA: * Influenza is commonly known as the 'flu'. * There are things that you can do to feel better and decrease your symptoms. * Here is some care advice that should help. * Fever: For fever over 101 F (38.3 C), take acetaminophen every 4 to 6 hours (Adults 650 mg) OR ibuprofen every 6 to 8 hours (Adults 400 mg). * Muscle aches, headache, and other pains: Often this comes and goes with the fever. Take acetaminophen every 4 to 6 hours (Adults 650 mg) OR ibuprofen every 6 to 8 hours (Adults 400 mg). * Sore throat: Try throat lozenges, hard candy or warm chicken broth. NASAL WASHES FOR A STUFFY NOSE: * Introduction: Saline (salt water) nasal irrigation (nasal wash) is an effective and simple home remedy for treating stuffy nose and sinus congestion. The nose can be irrigated by pouring, spraying, or squirting salt water into the nose and then letting it run back out. * How it Helps: The salt water rinses out excess mucus and washes out any irritants (dust, allergens) that might be present. It also moistens the nasal cavity. * Methods: There are several ways to irrigate the nose. You can use a saline nasal spray bottle (available over-thecounter), a rubber ear syringe, a medical syringe without the needle, or a NETI POT. NASAL DECONGESTANTS - EXTRA NOTES AND WARNINGS: * Do not use these medicines if you have high blood pressure, heart disease, prostate problems, or an overactive thyroid. COUGHING SPELLS: PAIN AND FEVER MEDICINES: * For pain or fever relief,  take either acetaminophen or ibuprofen. CALL BACK IF: * Runny nose lasts over 10 days * Cough lasts over 3 weeks * Difficulty breathing occurs * You become worse CARE ADVICE given per  Influenza - Seasonal (Adult) guideline

## 2022-01-10 ENCOUNTER — Encounter: Payer: Self-pay | Admitting: Family Medicine

## 2022-01-10 ENCOUNTER — Ambulatory Visit: Payer: BC Managed Care – PPO | Admitting: Family Medicine

## 2022-01-10 VITALS — BP 130/71 | HR 87 | Temp 98.3°F | Ht 68.0 in | Wt 213.5 lb

## 2022-01-10 DIAGNOSIS — R058 Other specified cough: Secondary | ICD-10-CM

## 2022-01-10 DIAGNOSIS — T464X5A Adverse effect of angiotensin-converting-enzyme inhibitors, initial encounter: Secondary | ICD-10-CM

## 2022-01-10 DIAGNOSIS — J069 Acute upper respiratory infection, unspecified: Secondary | ICD-10-CM | POA: Diagnosis not present

## 2022-01-10 MED ORDER — LOSARTAN POTASSIUM-HCTZ 100-25 MG PO TABS: 1.0000 | ORAL_TABLET | Freq: Every day | ORAL | 2 refills | Status: DC

## 2022-01-10 NOTE — Telephone Encounter (Signed)
Appt scheduled today °

## 2022-01-10 NOTE — Progress Notes (Signed)
Chief Complaint  Patient presents with   congestion, stuffy nose and sore throat    Sean Bowers here for URI complaints.  Duration: 2 days  Associated symptoms: sinus congestion, rhinorrhea, sore throat, and coughing Denies: sinus pain, itchy watery eyes, ear pain, ear drainage, wheezing, shortness of breath, myalgia, and fevers Treatment to date: Nyquil Sick contacts: No Neg for covid.  Had cough a/w ACEi use. In the past, did not want to change Zestoretic but is thinking differently at this point. CXR was neg recently.   Past Medical History:  Diagnosis Date   Allergy    Arthritis    Asthma    no inhalers   Diverticulitis 09/2016   seen in Urgent Care   Genital warts    History of chicken pox    History of hay fever    Hyperlipidemia 09/27/2016   Hypertension     Objective BP 130/71    Pulse 87    Temp 98.3 F (36.8 C) (Oral)    Ht 5\' 8"  (1.727 m)    Wt 213 lb 8 oz (96.8 kg)    SpO2 95%    BMI 32.46 kg/m  General: Awake, alert, appears stated age HEENT: AT, [place], ears patent b/l and TM's neg, nares patent w/o discharge, pharynx pink and without exudates, MMM Neck: No masses or asymmetry Heart: RRR Lungs: CTAB, no accessory muscle use Psych: Age appropriate judgment and insight, normal mood and affect  Viral URI with cough  ACE-inhibitor cough - Plan: losartan-hydrochlorothiazide (HYZAAR) 100-25 MG tablet  Reassurance. Cont Nyquil prn. Continue to push fluids, practice good hand hygiene, cover mouth when coughing. F/u prn. If starting to experience fevers, shaking, or shortness of breath, seek immediate care. AE from med for chronic condition. Stop Zestoretic, start Hyzaar 100-25 mg daily.  He has an appointment in 4 weeks where we will recheck things. Pt voiced understanding and agreement to the plan.  [place], DO 01/10/22 2:01 PM

## 2022-01-10 NOTE — Patient Instructions (Addendum)
Continue to push fluids, practice good hand hygiene, and cover your mouth if you cough.  If you start having fevers, shaking or shortness of breath, seek immediate care.  OK to take Tylenol 1000 mg (2 extra strength tabs) or 975 mg (3 regular strength tabs) every 6 hours as needed.  The cough can take 4-6 weeks to resolve after changing the blood pressure medication.   Keep the diet clean and stay active.  Take tomorrow off.   Let us know if you need anything.

## 2022-02-25 ENCOUNTER — Ambulatory Visit: Payer: BC Managed Care – PPO | Admitting: Family Medicine

## 2022-03-06 ENCOUNTER — Ambulatory Visit: Payer: BC Managed Care – PPO | Admitting: Family Medicine

## 2022-03-15 ENCOUNTER — Ambulatory Visit: Payer: BC Managed Care – PPO | Admitting: Family Medicine

## 2022-03-15 ENCOUNTER — Encounter: Payer: Self-pay | Admitting: Family Medicine

## 2022-03-15 VITALS — BP 118/76 | HR 65 | Temp 97.6°F | Ht 68.0 in | Wt 214.2 lb

## 2022-03-15 DIAGNOSIS — R058 Other specified cough: Secondary | ICD-10-CM | POA: Diagnosis not present

## 2022-03-15 DIAGNOSIS — M25511 Pain in right shoulder: Secondary | ICD-10-CM

## 2022-03-15 DIAGNOSIS — G8929 Other chronic pain: Secondary | ICD-10-CM

## 2022-03-15 DIAGNOSIS — I10 Essential (primary) hypertension: Secondary | ICD-10-CM | POA: Diagnosis not present

## 2022-03-15 DIAGNOSIS — T464X5A Adverse effect of angiotensin-converting-enzyme inhibitors, initial encounter: Secondary | ICD-10-CM | POA: Diagnosis not present

## 2022-03-15 DIAGNOSIS — M25512 Pain in left shoulder: Secondary | ICD-10-CM

## 2022-03-15 MED ORDER — METHOCARBAMOL 750 MG PO TABS: 750.0000 mg | ORAL_TABLET | Freq: Three times a day (TID) | ORAL | 0 refills | Status: DC | PRN

## 2022-03-15 NOTE — Progress Notes (Signed)
Chief Complaint  ?Patient presents with  ? Follow-up  ?  Muscle pain  ? ? ?Subjective ?Sean Bowers is a 66 y.o. male who presents for hypertension follow up. ?He does not monitor home blood pressures. ?He is compliant with medications- Hyzaar 100-25 mg/d. ?Patient has these side effects of medication: he was having a cough and Zestoretic was changed to Hyzaar.  ?He is adhering to a healthy diet overall. ?Current exercise: active at work  ?No Cp or SOB.  ? ?Shoulder pain ?B/l shoulder pain for >6 mo. Feels over the top of his shoulders radiating down slightly. R is slightly worse. No inj or change in activity. Denies any bruising, swelling, redness. Nml Rom but pain towards the end of ROM. Describes the pain as achy and dull. Tried topical meds like Icy Hot without relief. Tried some stretches/exercises which were not particularly.  ?  ?Past Medical History:  ?Diagnosis Date  ? Allergy   ? Arthritis   ? Asthma   ? no inhalers  ? Diverticulitis 09/2016  ? seen in Urgent Care  ? Genital warts   ? History of chicken pox   ? History of hay fever   ? Hyperlipidemia 09/27/2016  ? Hypertension   ? ?Exam ?BP 118/76   Pulse 65   Temp 97.6 ?F (36.4 ?C) (Oral)   Ht '5\' 8"'$  (1.727 m)   Wt 214 lb 4 oz (97.2 kg)   SpO2 95%   BMI 32.58 kg/m?  ?General:  well developed, well nourished, in no apparent distress ?Heart: RRR, no bruits ?Lungs: clear to auscultation, no accessory muscle use ?MSK: mild ttp over coracoid process b/l; no other ttp, deformity, edema ?+Empty can on R, Neer's b/l, Hawkins b/l, neg speeds, cross over, lift off b/l ?Decreased active/passive forward flexion b/l, worse on R ?Psych: well oriented with normal range of affect and appropriate judgment/insight ? ?Essential hypertension ? ?ACE-inhibitor cough ? ?Chronic pain of both shoulders - Plan: methocarbamol (ROBAXIN) 750 MG tablet ? ?1/2. Chronic, stable. Counseled on diet and exercise. Cont Hyzaar 100-25 mg/d.  ?3. Chronic, unstable. Cont  stretches/exercises. Heaat, ice, Tylenol, can trial Robaxin 750 mg TID prn.  ?F/u in 4 mo for CPE or prn. ?The patient voiced understanding and agreement to the plan. ? ?Shelda Pal, DO ?03/15/22  ?7:47 AM ? ?

## 2022-03-15 NOTE — Patient Instructions (Addendum)
Heat (pad or rice pillow in microwave) over affected area, 10-15 minutes twice daily.  ? ?Ice/cold pack over area for 10-15 min twice daily. ? ?OK to take Tylenol 1000 mg (2 extra strength tabs) or 975 mg (3 regular strength tabs) every 6 hours as needed. ? ?We can try a muscle relaxer.  ? ?Continue the stretches and exercises for the next month.  ? ?Let us know if you need anything. ?

## 2022-04-07 ENCOUNTER — Other Ambulatory Visit: Payer: Self-pay | Admitting: Family Medicine

## 2022-04-07 DIAGNOSIS — R058 Other specified cough: Secondary | ICD-10-CM

## 2022-04-18 ENCOUNTER — Ambulatory Visit: Payer: BC Managed Care – PPO | Admitting: Family Medicine

## 2022-04-18 ENCOUNTER — Encounter: Payer: Self-pay | Admitting: Family Medicine

## 2022-04-18 VITALS — BP 118/76 | HR 62 | Temp 98.0°F | Ht 68.0 in | Wt 220.4 lb

## 2022-04-18 DIAGNOSIS — M25511 Pain in right shoulder: Secondary | ICD-10-CM | POA: Diagnosis not present

## 2022-04-18 DIAGNOSIS — D0462 Carcinoma in situ of skin of left upper limb, including shoulder: Secondary | ICD-10-CM

## 2022-04-18 DIAGNOSIS — G8929 Other chronic pain: Secondary | ICD-10-CM | POA: Diagnosis not present

## 2022-04-18 DIAGNOSIS — M25512 Pain in left shoulder: Secondary | ICD-10-CM | POA: Diagnosis not present

## 2022-04-18 DIAGNOSIS — D489 Neoplasm of uncertain behavior, unspecified: Secondary | ICD-10-CM

## 2022-04-18 MED ORDER — METHYLPREDNISOLONE ACETATE 40 MG/ML IJ SUSP
40.0000 mg | Freq: Once | INTRAMUSCULAR | Status: AC
  Administered 2022-04-18: 40 mg via INTRA_ARTICULAR

## 2022-04-18 NOTE — Progress Notes (Signed)
Chief Complaint  Patient presents with   Follow-up    Subjective: Patient is a 66 y.o. male here for f/u shoulder pain.  Pt was given Robaxin and stretches/exercises for presumed RC etiology. New muscle relaxer did not help. Since that time, he reports no improvement with the new muscle relaxer. Still having limited ROM and pain through the end of ROM b/l, worse on the R side. Seems to get worse when he is more active. Has not been compliant with HEP.   Skin lesion L forearm for several months. No itching, drainage, pain. Does not bleed. Has not tried anything so far.   Past Medical History:  Diagnosis Date   Allergy    Arthritis    Asthma    no inhalers   Diverticulitis 09/2016   seen in Urgent Care   Genital warts    History of chicken pox    History of hay fever    Hyperlipidemia 09/27/2016   Hypertension     Objective: BP 118/76   Pulse 62   Temp 98 F (36.7 C) (Oral)   Ht '5\' 8"'$  (1.727 m)   Wt 220 lb 6 oz (100 kg)   SpO2 98%   BMI 33.51 kg/m  General: Awake, appears stated age MSK: Decreased active and passive range of motion of the right shoulder, mild TTP anteriorly, positive Neer's, empty can, Hawkins on the right, equivocal speeds, negative liftoff, crossover, [place] Skin: Scaly and raised lesion measuring 0.4 x 0.3 cm on the posterior lateral left forearm, erythematous base, no fluctuance, drainage, TTP, excessive warmth, drainage Lungs: No accessory muscle use Psych: Age appropriate judgment and insight, normal affect and mood  Procedure Note; Shoulder joint injection Informed consent obtained. The area was palpated, an area was marked posterior to the acromion process approximately 2 cm inferiorly, and cleaned with alcohol after being marked with an otoscope document. Topical anesthesia was then used. A 25-gauge needle, while aiming towards the coracoid process, was used to enter the joint posteriorally with ease. 40 mg of Depo-Medrol with 2 mL of 1%  lidocaine was injected. A Band-Aid was placed. The patient tolerated the procedure well. There were no complications noted.  Procedure note; shave biopsy Informed consent was obtained. Indication: Diagnosis, therapeutic The area was cleaned with alcohol and injected with 0.5 mL of 1% lidocaine with epinephrine. A Dermablade was slightly bent and used to cut under the area of interest. The specimen was placed in a sterile specimen cup and sent to the lab. The area was then cauterized ensuring adequate hemostasis. The area was dressed with triple antibiotic ointment and a bandage. There were no complications noted. The patient tolerated the procedure well.  Assessment and Plan: Chronic pain of both shoulders - Plan: methylPREDNISolone acetate (DEPO-MEDROL) injection 40 mg, PR DRAIN/INJECT LARGE JOINT/BURSA  Neoplasm of uncertain behavior - Plan: Surgical pathology( [facility]/ [facility]), PR SHAV SKIN LES < 0.5 CM TRUNK,ARM,LEG  Encouraged him to be compliant with a home exercise program.  Offered physical therapy which he politely declined.  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  Consider physical therapy versus referral if no improvement in the next month. Shave biopsy today.  Could be a verruca but with erythematous base and timing, will rule out an AK or skin cancer.  Aftercare instructions verbalized and written down. The patient voiced understanding and agreement to the plan.  [place], DO 04/18/22  7:50 AM

## 2022-04-18 NOTE — Patient Instructions (Signed)
Heat (pad or rice pillow in microwave) over affected area, 10-15 minutes twice daily.   Ice/cold pack over area for 10-15 min twice daily.  OK to take Tylenol 1000 mg (2 extra strength tabs) or 975 mg (3 regular strength tabs) every 6 hours as needed.  Muscle relaxers are not likely to be beneficial any longer.  Do not shower for the rest of the day. When you do wash it, use only soap and water. Do not vigorously scrub. Apply triple antibiotic ointment (like Neosporin) twice daily. Keep the area clean and dry.   Things to look out for: increasing pain not relieved by ibuprofen/acetaminophen, fevers, spreading redness, drainage of pus, or foul odor.  Give [place] 1 week to get the results of your biopsy back.  Let us know if you need anything.  EXERCISES  RANGE OF MOTION (ROM) AND STRETCHING EXERCISES These exercises may help you when beginning to rehabilitate your injury. While completing these exercises, remember:  Restoring tissue flexibility helps normal motion to return to the joints. This allows healthier, less painful movement and activity. An effective stretch should be held for at least 30 seconds. A stretch should never be painful. You should only feel a gentle lengthening or release in the stretched tissue.  ROM - Pendulum Bend at the waist so that your right / left arm falls away from your body. Support yourself with your opposite hand on a solid surface, such as a table or a countertop. Your right / left arm should be perpendicular to the ground. If it is not perpendicular, you need to lean over farther. Relax the muscles in your right / left arm and shoulder as much as possible. Gently sway your hips and trunk so they move your right / left arm without any use of your right / left shoulder muscles. Progress your movements so that your right / left arm moves side to side, then forward and backward, and finally, both clockwise and counterclockwise. Complete 10-15 repetitions in each  direction. Many people use this exercise to relieve discomfort in their shoulder as well as to gain range of motion. Repeat 2 times. Complete this exercise 3 times per week.  STRETCH - Flexion, Standing Stand with good posture. With an underhand grip on your right / left hand and an overhand grip on the opposite hand, grasp a broomstick or cane so that your hands are a little more than shoulder-width apart. Keeping your right / left elbow straight and shoulder muscles relaxed, push the stick with your opposite hand to raise your right / left arm in front of your body and then overhead. Raise your arm until you feel a stretch in your right / left shoulder, but before you have increased shoulder pain. Try to avoid shrugging your right / left shoulder as your arm rises by keeping your shoulder blade tucked down and toward your mid-back spine. Hold 30 seconds. Slowly return to the starting position. Repeat 2 times. Complete this exercise 3 times per week.  STRETCH - Internal Rotation Place your right / left hand behind your back, palm-up. Throw a towel or belt over your opposite shoulder. Grasp the towel/belt with your right / left hand. While keeping an upright posture, gently pull up on the towel/belt until you feel a stretch in the front of your right / left shoulder. Avoid shrugging your right / left shoulder as your arm rises by keeping your shoulder blade tucked down and toward your mid-back spine. Hold 30. Release the  stretch by lowering your opposite hand. Repeat 2 times. Complete this exercise 3 times per week.  STRETCH - External Rotation and Abduction Stagger your stance through a doorframe. It does not matter which foot is forward. As instructed by your physician, physical therapist or athletic trainer, place your hands: And forearms above your head and on the door frame. And forearms at head-height and on the door frame. At elbow-height and on the door frame. Keeping your head and  chest upright and your stomach muscles tight to prevent over-extending your low-back, slowly shift your weight onto your front foot until you feel a stretch across your chest and/or in the front of your shoulders. Hold 30 seconds. Shift your weight to your back foot to release the stretch. Repeat 2 times. Complete this stretch 3 times per week.   STRENGTHENING EXERCISES  These exercises may help you when beginning to rehabilitate your injury. They may resolve your symptoms with or without further involvement from your physician, physical therapist or athletic trainer. While completing these exercises, remember:  Muscles can gain both the endurance and the strength needed for everyday activities through controlled exercises. Complete these exercises as instructed by your physician, physical therapist or athletic trainer. Progress the resistance and repetitions only as guided. You may experience muscle soreness or fatigue, but the pain or discomfort you are trying to eliminate should never worsen during these exercises. If this pain does worsen, stop and make certain you are following the directions exactly. If the pain is still present after adjustments, discontinue the exercise until you can discuss the trouble with your clinician. If advised by your physician, during your recovery, avoid activity or exercises which involve actions that place your right / left hand or elbow above your head or behind your back or head. These positions stress the tissues which are trying to heal.  STRENGTH - Scapular Depression and Adduction With good posture, sit on a firm chair. Supported your arms in front of you with pillows, arm rests or a table top. Have your elbows in line with the sides of your body. Gently draw your shoulder blades down and toward your mid-back spine. Gradually increase the tension without tensing the muscles along the top of your shoulders and the back of your neck. Hold for 3 seconds. Slowly  release the tension and relax your muscles completely before completing the next repetition. After you have practiced this exercise, remove the arm support and complete it in standing as well as sitting. Repeat 2 times. Complete this exercise 3 times per week.   STRENGTH - External Rotators Secure a rubber exercise band/tubing to a fixed object so that it is at the same height as your right / left elbow when you are standing or sitting on a firm surface. Stand or sit so that the secured exercise band/tubing is at your side that is not injured. Bend your elbow 90 degrees. Place a folded towel or small pillow under your right / left arm so that your elbow is a few inches away from your side. Keeping the tension on the exercise band/tubing, pull it away from your body, as if pivoting on your elbow. Be sure to keep your body steady so that the movement is only coming from your shoulder rotating. Hold 3 seconds. Release the tension in a controlled manner as you return to the starting position. Repeat 2 times. Complete this exercise 3 times per week.   STRENGTH - Supraspinatus Stand or sit with good posture. Grasp  a 2-3 lb weight or an exercise band/tubing so that your hand is "thumbs-up," like when you shake hands. Slowly lift your right / left hand from your thigh into the air, traveling about 30 degrees from straight out at your side. Lift your hand to shoulder height or as far as you can without increasing any shoulder pain. Initially, many people do not lift their hands above shoulder height. Avoid shrugging your right / left shoulder as your arm rises by keeping your shoulder blade tucked down and toward your mid-back spine. Hold for 3 seconds. Control the descent of your hand as you slowly return to your starting position. Repeat 2 times. Complete this exercise 3 times per week.   STRENGTH - Shoulder Extensors Secure a rubber exercise band/tubing so that it is at the height of your shoulders  when you are either standing or sitting on a firm arm-less chair. With a thumbs-up grip, grasp an end of the band/tubing in each hand. Straighten your elbows and lift your hands straight in front of you at shoulder height. Step back away from the secured end of band/tubing until it becomes tense. Squeezing your shoulder blades together, pull your hands down to the sides of your thighs. Do not allow your hands to go behind you. Hold for 3 seconds. Slowly ease the tension on the band/tubing as you reverse the directions and return to the starting position. Repeat 2 times. Complete this exercise 3 times per week.   STRENGTH - Scapular Retractors Secure a rubber exercise band/tubing so that it is at the height of your shoulders when you are either standing or sitting on a firm arm-less chair. With a palm-down grip, grasp an end of the band/tubing in each hand. Straighten your elbows and lift your hands straight in front of you at shoulder height. Step back away from the secured end of band/tubing until it becomes tense. Squeezing your shoulder blades together, draw your elbows back as you bend them. Keep your upper arm lifted away from your body throughout the exercise. Hold 3 seconds. Slowly ease the tension on the band/tubing as you reverse the directions and return to the starting position. Repeat 2 times. Complete this exercise 3 times per week.  STRENGTH - Scapular Depressors Find a sturdy chair without wheels, such as a from a dining room table. Keeping your feet on the floor, lift your bottom from the seat and lock your elbows. Keeping your elbows straight, allow gravity to pull your body weight down. Your shoulders will rise toward your ears. Raise your body against gravity by drawing your shoulder blades down your back, shortening the distance between your shoulders and ears. Although your feet should always maintain contact with the floor, your feet should progressively support less body  weight as you get stronger. Hold 3 seconds. In a controlled and slow manner, lower your body weight to begin the next repetition. Repeat 2 times. Complete this exercise 3 times per week.    This information is not intended to replace advice given to you by your health care provider. Make sure you discuss any questions you have with your health care provider.   Document Released: 09/18/2005 Document Revised: 11/25/2014 Document Reviewed: 02/16/2009 Elsevier Interactive Patient Education [employer].

## 2022-05-05 ENCOUNTER — Other Ambulatory Visit: Payer: Self-pay | Admitting: Family Medicine

## 2022-05-05 DIAGNOSIS — F5104 Psychophysiologic insomnia: Secondary | ICD-10-CM

## 2022-05-06 ENCOUNTER — Other Ambulatory Visit: Payer: Self-pay | Admitting: Family Medicine

## 2022-05-06 DIAGNOSIS — G8929 Other chronic pain: Secondary | ICD-10-CM

## 2022-05-10 ENCOUNTER — Other Ambulatory Visit: Payer: Self-pay | Admitting: Family Medicine

## 2022-05-10 DIAGNOSIS — R058 Other specified cough: Secondary | ICD-10-CM

## 2022-05-14 ENCOUNTER — Encounter: Payer: Self-pay | Admitting: Family Medicine

## 2022-05-14 ENCOUNTER — Ambulatory Visit (HOSPITAL_BASED_OUTPATIENT_CLINIC_OR_DEPARTMENT_OTHER)
Admission: RE | Admit: 2022-05-14 | Discharge: 2022-05-14 | Disposition: A | Discharge status: Home / Self Care | Payer: BC Managed Care – PPO | Source: Ambulatory Visit | Attending: Family Medicine | Admitting: Family Medicine

## 2022-05-14 ENCOUNTER — Ambulatory Visit: Payer: BC Managed Care – PPO | Admitting: Family Medicine

## 2022-05-14 ENCOUNTER — Encounter (HOSPITAL_BASED_OUTPATIENT_CLINIC_OR_DEPARTMENT_OTHER): Payer: Self-pay

## 2022-05-14 VITALS — BP 124/75 | HR 79 | Ht 68.0 in | Wt 209.0 lb

## 2022-05-14 DIAGNOSIS — K5792 Diverticulitis of intestine, part unspecified, without perforation or abscess without bleeding: Secondary | ICD-10-CM | POA: Diagnosis not present

## 2022-05-14 DIAGNOSIS — R1031 Right lower quadrant pain: Secondary | ICD-10-CM | POA: Diagnosis not present

## 2022-05-14 DIAGNOSIS — E876 Hypokalemia: Secondary | ICD-10-CM

## 2022-05-14 DIAGNOSIS — I700 Atherosclerosis of aorta: Secondary | ICD-10-CM | POA: Diagnosis not present

## 2022-05-14 DIAGNOSIS — R109 Unspecified abdominal pain: Secondary | ICD-10-CM | POA: Diagnosis not present

## 2022-05-14 LAB — COMPREHENSIVE METABOLIC PANEL
ALT: 28 U/L (ref 0–53)
AST: 20 U/L (ref 0–37)
Albumin: 3.7 g/dL (ref 3.5–5.2)
Alkaline Phosphatase: 48 U/L (ref 39–117)
BUN: 11 mg/dL (ref 6–23)
CO2: 30 mEq/L (ref 19–32)
Calcium: 9.4 mg/dL (ref 8.4–10.5)
Chloride: 99 mEq/L (ref 96–112)
Creatinine, Ser: 0.85 mg/dL (ref 0.40–1.50)
GFR: 91.45 mL/min (ref >60.00)
Glucose, Bld: 132 mg/dL — ABNORMAL HIGH (ref 70–99)
Potassium: 3.1 mEq/L — ABNORMAL LOW (ref 3.5–5.1)
Sodium: 140 mEq/L (ref 135–145)
Total Bilirubin: 1.2 mg/dL (ref 0.2–1.2)
Total Protein: 6.9 g/dL (ref 6.0–8.3)

## 2022-05-14 LAB — CBC
HCT: 40.6 % (ref 39.0–52.0)
Hemoglobin: 14 g/dL (ref 13.0–17.0)
MCHC: 34.6 g/dL (ref 30.0–36.0)
MCV: 86.7 fl (ref 78.0–100.0)
Platelets: 192 10*3/uL (ref 150.0–400.0)
RBC: 4.69 Mil/uL (ref 4.22–5.81)
RDW: 13.1 % (ref 11.5–15.5)
WBC: 8.4 10*3/uL (ref 4.0–10.5)

## 2022-05-14 MED ORDER — CIPROFLOXACIN HCL 500 MG PO TABS: 500.0000 mg | ORAL_TABLET | Freq: Two times a day (BID) | ORAL | 0 refills | Status: AC

## 2022-05-14 MED ORDER — METRONIDAZOLE 500 MG PO TABS: 500.0000 mg | ORAL_TABLET | Freq: Three times a day (TID) | ORAL | 0 refills | Status: AC

## 2022-05-14 MED ORDER — IOHEXOL 300 MG/ML  SOLN
100.0000 mL | Freq: Once | INTRAMUSCULAR | Status: AC | PRN
  Administered 2022-05-14: 100 mL via INTRAVENOUS

## 2022-05-15 MED ORDER — POTASSIUM CHLORIDE CRYS ER 20 MEQ PO TBCR: 40.0000 meq | EXTENDED_RELEASE_TABLET | Freq: Every day | ORAL | 0 refills | Status: DC

## 2022-05-15 NOTE — Addendum Note (Signed)
Addended by: Caleen Jobs B on: 05/15/2022 08:15 AM   Modules accepted: Orders

## 2022-05-16 ENCOUNTER — Encounter: Payer: Self-pay | Admitting: Family Medicine

## 2022-05-16 NOTE — Telephone Encounter (Signed)
Patient's wife called to get an update on the message sent to [facility] this morning, advised that provider didn't have a chance to see it yet but will respond once able to.

## 2022-05-20 ENCOUNTER — Other Ambulatory Visit (INDEPENDENT_AMBULATORY_CARE_PROVIDER_SITE_OTHER): Payer: BC Managed Care – PPO

## 2022-05-20 DIAGNOSIS — E876 Hypokalemia: Secondary | ICD-10-CM | POA: Diagnosis not present

## 2022-05-20 LAB — BASIC METABOLIC PANEL
BUN: 11 mg/dL (ref 6–23)
CO2: 29 mEq/L (ref 19–32)
Calcium: 9.3 mg/dL (ref 8.4–10.5)
Chloride: 101 mEq/L (ref 96–112)
Creatinine, Ser: 0.93 mg/dL (ref 0.40–1.50)
GFR: 86.41 mL/min (ref >60.00)
Glucose, Bld: 102 mg/dL — ABNORMAL HIGH (ref 70–99)
Potassium: 4.1 mEq/L (ref 3.5–5.1)
Sodium: 139 mEq/L (ref 135–145)

## 2022-05-26 ENCOUNTER — Other Ambulatory Visit: Payer: Self-pay | Admitting: Family Medicine

## 2022-05-27 ENCOUNTER — Encounter: Payer: Self-pay | Admitting: Family Medicine

## 2022-05-27 ENCOUNTER — Ambulatory Visit: Payer: BC Managed Care – PPO | Admitting: Family Medicine

## 2022-05-27 VITALS — BP 120/76 | HR 70 | Temp 98.1°F | Ht 68.0 in | Wt 207.4 lb

## 2022-05-27 DIAGNOSIS — K5792 Diverticulitis of intestine, part unspecified, without perforation or abscess without bleeding: Secondary | ICD-10-CM

## 2022-05-27 DIAGNOSIS — E876 Hypokalemia: Secondary | ICD-10-CM

## 2022-05-27 MED ORDER — LEVOFLOXACIN 750 MG PO TABS: 750.0000 mg | ORAL_TABLET | Freq: Every day | ORAL | 0 refills | Status: AC

## 2022-05-27 MED ORDER — METRONIDAZOLE 500 MG PO TABS: 500.0000 mg | ORAL_TABLET | Freq: Two times a day (BID) | ORAL | 0 refills | Status: AC

## 2022-05-27 MED ORDER — POTASSIUM CHLORIDE ER 10 MEQ PO TBCR: 10.0000 meq | EXTENDED_RELEASE_TABLET | Freq: Every day | ORAL | 2 refills | Status: DC

## 2022-05-27 NOTE — Progress Notes (Signed)
Chief Complaint  Patient presents with   Abdominal Pain    Matti Minney is here for R sided abdominal pain. Here w spouse.   Patient was seen in 6/26 for diverticulitis with microperforation.  He was placed on 10 days of Flagyl and Cipro.  This helps but 2 days after completing the course, he started having right-sided pain again.  It is around 50% as intense as prior.  He is not having any fevers, nausea, vomiting, bleeding, diarrhea, or decreased appetite.  Past Medical History:  Diagnosis Date   Allergy    Arthritis    Asthma    no inhalers   Diverticulitis 09/2016   seen in Urgent Care   Genital warts    History of chicken pox    History of hay fever    Hyperlipidemia 09/27/2016   Hypertension     BP 120/76   Pulse 70   Temp 98.1 F (36.7 C) (Oral)   Ht '5\' 8"'$  (1.727 m)   Wt 207 lb 6 oz (94.1 kg)   SpO2 97%   BMI 31.53 kg/m  Gen.: Awake, alert, appears stated age 66: Mucous membranes moist without mucosal lesions Heart: Regular rate and rhythm without murmurs Lungs: Clear auscultation bilaterally, no rales or wheezing, normal effort without accessory muscle use. Abdomen: Bowel sounds are present. Abdomen is soft, mild TTP over the right abdominal region, nondistended, no masses or organomegaly. Negative Murphy's, Rovsing's, McBurney's, and Carnett's sign. Psych: Age appropriate judgment and insight. Normal mood and affect.  Hypokalemia  Diverticulitis - Plan: metroNIDAZOLE (FLAGYL) 500 MG tablet, levofloxacin (LEVAQUIN) 750 MG tablet, Ambulatory referral to General Surgery  We will start daily potassium supplementation as he tends to lose a little bit more with the hydrochlorothiazide component of his Hyzaar. Recurrent diverticulitis.  He looks well so do not think he needs IV antibiotics at this time.  Try combination of Levaquin 750 mg daily for 10 days and Flagyl 500 mg twice daily for 10 days.  Refer to general surgery as a contingency.  He does have CT  evidence of microperforations.  If he fails this course, he may need more aggressive management. Pt and his spouse voiced understanding and agreement to the plan.  I spent 35 minutes with the patient discussing the above plans in addition to reviewing his chart on the same date of visit.  [place], DO 05/27/22 9:16 AM

## 2022-05-27 NOTE — Patient Instructions (Signed)
If you do not hear anything about your referral in the next 1-2 weeks, call our office and ask for an update.  If things worsen, please seek immediate care.  Let us know if you need anything.

## 2022-06-07 ENCOUNTER — Other Ambulatory Visit: Payer: Self-pay | Admitting: Family Medicine

## 2022-06-07 DIAGNOSIS — R058 Other specified cough: Secondary | ICD-10-CM

## 2022-06-21 ENCOUNTER — Encounter: Payer: Self-pay | Admitting: Family Medicine

## 2022-06-21 ENCOUNTER — Ambulatory Visit: Payer: BC Managed Care – PPO | Admitting: Family Medicine

## 2022-06-21 VITALS — BP 122/82 | HR 73 | Temp 98.2°F | Ht 68.0 in | Wt 211.0 lb

## 2022-06-21 DIAGNOSIS — M25512 Pain in left shoulder: Secondary | ICD-10-CM | POA: Diagnosis not present

## 2022-06-21 DIAGNOSIS — G8929 Other chronic pain: Secondary | ICD-10-CM | POA: Diagnosis not present

## 2022-06-21 DIAGNOSIS — B356 Tinea cruris: Secondary | ICD-10-CM | POA: Diagnosis not present

## 2022-06-21 MED ORDER — KETOCONAZOLE 2 % EX CREA: 1.0000 | TOPICAL_CREAM | Freq: Every day | CUTANEOUS | 0 refills | Status: AC

## 2022-06-21 MED ORDER — METHYLPREDNISOLONE ACETATE 40 MG/ML IJ SUSP
40.0000 mg | Freq: Once | INTRAMUSCULAR | Status: AC
  Administered 2022-06-21: 40 mg via INTRA_ARTICULAR

## 2022-06-21 NOTE — Progress Notes (Addendum)
Chief Complaint  Patient presents with   Rash    Around anal area  Felt small lump around testicle area   Shoulder Pain    both    Sean Bowers is a 66 y.o. male here for a skin complaint.  Duration: 1 week Location: Perennial area Pruritic? Yes; slight Painful? No Drainage? No New soaps/lotions/topicals/detergents? No Sick contacts? No Other associated symptoms: Feels a lump in his left testicle; no spreading redness, fevers Therapies tried thus far: pink salve  Hx of b/l shoulder pain. L shoulder losing ROM. Injected R shoulder 2 mo ago and it worked well. Interested in doing the other side.   Past Medical History:  Diagnosis Date   Allergy    Arthritis    Asthma    no inhalers   Diverticulitis 09/2016   seen in Urgent Care   Genital warts    History of chicken pox    History of hay fever    Hyperlipidemia 09/27/2016   Hypertension     BP 122/82   Pulse 73   Temp 98.2 F (36.8 C) (Oral)   Ht '5\' 8"'$  (1.727 m)   Wt 211 lb (95.7 kg)   SpO2 94%   BMI 32.08 kg/m  Gen: awake, alert, appearing stated age Lungs: No accessory muscle use Skin: pinkish patch of skin in both inguinal/genital regions bordering the scrotum. No drainage, excessive warmth, TTP, fluctuance, excoriation MSK: Decreased active/passive ROM of L shoulder, +Neer's, Hawkins, Empty can equiv, neg Speed's, cross over, lift off.  Psych: Age appropriate judgment and insight  Procedure Note; Shoulder joint injection Informed consent obtained. The area was palpated, an area was marked posterior to the acromion process approximately 2 cm inferiorly, and cleaned with Betadine x1. A 27-gauge needle, while aiming towards the coracoid process, was used to enter the joint posteriorally with ease. 40 mg of Depomedrol with 2 mL of 1% lidocaine was injected. The patient tolerated the procedure well. There were no complications noted.   Tinea cruris - Plan: ketoconazole (NIZORAL) 2 % cream  Chronic left  shoulder pain - Plan: methylPREDNISolone acetate (DEPO-MEDROL) injection 40 mg, PR DRAIN/INJECT LARGE JOINT/BURSA  2 weeks of ketoconazole cream. Keep clean and dry. Steroid injection today as it worked well on R shoulder. Cont home exercises/stretches. He politely declined PT and specialty referral today.  F/u prn. The patient voiced understanding and agreement to the plan.  [place], DO 06/21/22 10:15 AM

## 2022-06-21 NOTE — Patient Instructions (Signed)
Keep doing the stretches for your shoulders.   Let me know if you want to go to PT or see a specialist.  Keep the skin clean and dry. Consider baby powder.   Let us know if you need anything.

## 2022-06-28 DIAGNOSIS — C61 Malignant neoplasm of prostate: Secondary | ICD-10-CM | POA: Diagnosis not present

## 2022-07-01 ENCOUNTER — Other Ambulatory Visit: Payer: Self-pay | Admitting: Family Medicine

## 2022-07-01 DIAGNOSIS — G8929 Other chronic pain: Secondary | ICD-10-CM

## 2022-07-05 DIAGNOSIS — N401 Enlarged prostate with lower urinary tract symptoms: Secondary | ICD-10-CM | POA: Diagnosis not present

## 2022-07-05 DIAGNOSIS — C61 Malignant neoplasm of prostate: Secondary | ICD-10-CM | POA: Diagnosis not present

## 2022-07-05 DIAGNOSIS — R3915 Urgency of urination: Secondary | ICD-10-CM | POA: Diagnosis not present

## 2022-07-18 ENCOUNTER — Other Ambulatory Visit: Payer: Self-pay | Admitting: Family Medicine

## 2022-07-18 DIAGNOSIS — T464X5A Adverse effect of angiotensin-converting-enzyme inhibitors, initial encounter: Secondary | ICD-10-CM

## 2022-08-01 ENCOUNTER — Other Ambulatory Visit: Payer: Self-pay | Admitting: Family Medicine

## 2022-08-01 DIAGNOSIS — G8929 Other chronic pain: Secondary | ICD-10-CM

## 2022-08-06 ENCOUNTER — Ambulatory Visit: Payer: BC Managed Care – PPO | Admitting: Family Medicine

## 2022-08-06 ENCOUNTER — Encounter: Payer: Self-pay | Admitting: Family Medicine

## 2022-08-06 VITALS — BP 138/84 | HR 70 | Temp 98.2°F | Ht 68.0 in | Wt 209.6 lb

## 2022-08-06 DIAGNOSIS — G5702 Lesion of sciatic nerve, left lower limb: Secondary | ICD-10-CM | POA: Diagnosis not present

## 2022-08-06 DIAGNOSIS — M25511 Pain in right shoulder: Secondary | ICD-10-CM | POA: Diagnosis not present

## 2022-08-06 DIAGNOSIS — G8929 Other chronic pain: Secondary | ICD-10-CM | POA: Diagnosis not present

## 2022-08-06 DIAGNOSIS — M25512 Pain in left shoulder: Secondary | ICD-10-CM

## 2022-08-06 MED ORDER — METHYLPREDNISOLONE ACETATE 40 MG/ML IJ SUSP
40.0000 mg | Freq: Once | INTRAMUSCULAR | Status: AC
  Administered 2022-08-06: 40 mg via INTRALESIONAL

## 2022-08-06 MED ORDER — MELOXICAM 15 MG PO TABS: 15.0000 mg | ORAL_TABLET | Freq: Every day | ORAL | 0 refills | Status: DC

## 2022-08-06 NOTE — Addendum Note (Signed)
Addended by: Kittie Plater, Gervis Gaba HUA on: 08/06/2022 08:04 AM   Modules accepted: Orders

## 2022-08-06 NOTE — Progress Notes (Addendum)
Musculoskeletal Exam  Patient: Sean Bowers DOB: 22-Apr-1956  DOS: 08/06/2022  SUBJECTIVE:  Chief Complaint:   Chief Complaint  Patient presents with   Hip Pain    Left side going on a  month     Sean Bowers is a 66 y.o.  male for evaluation and treatment of L hip pain.   Onset:  1 month ago. No inj or change in activity.  Location: L outer buttock Character:  Dull Progression of issue:  is unchanged Associated symptoms: getting up does not feel good, limping No bruising, swelling, redness, catching, locking Treatment: to date has been: OTC NSAIDs.   Neurovascular symptoms: no  Chronic b/l shoulde rpain. Had L shoulder injection and did well. Would like R shoulder injection. Having limited ROM and pain. No redness, bruising, swelling.   Past Medical History:  Diagnosis Date   Allergy    Arthritis    Asthma    no inhalers   Diverticulitis 09/2016   seen in Urgent Care   Genital warts    History of chicken pox    History of hay fever    Hyperlipidemia 09/27/2016   Hypertension     Objective: VITAL SIGNS: BP 138/84   Pulse 70   Temp 98.2 F (36.8 C) (Oral)   Ht '5\' 8"'$  (1.727 m)   Wt 209 lb 9.6 oz (95.1 kg)   SpO2 97%   BMI 31.87 kg/m  Constitutional: Well formed, well developed. No acute distress. Thorax & Lungs: No accessory muscle use Musculoskeletal: L hip pain.   Normal active range of motion: yes.   Normal passive range of motion: yes Tenderness to palpation: yes over piriformis Deformity: no Ecchymosis: no Tests positive: none Tests negative: FABER, FADDIR, logroll, Stinchfield, Ober's No pain w resisted hip abduction Pain w flexion and medial flexion of the hip over the piriformis Neurologic: Normal sensory function. No focal deficits noted. DTRs equal and symmetric throughout LE's. Gait antalgic.  Psychiatric: Normal mood. Age appropriate judgment and insight. Alert & oriented x 3.    Procedure Note; R shoulder joint injection Informed  consent obtained. The area was palpated, an area was marked posterior to the acromion process approximately 2 cm inferiorly, and cleaned with Betadine x1. A 27-gauge needle, while aiming towards the coracoid process, was used to enter the joint posteriorally with ease. 40 mg of Depomedrol with 2 mL of 1% lidocaine was injected. A Bandaid was placed.  The patient tolerated the procedure well. There were no complications noted.  Assessment:  Piriformis syndrome of left side - Plan: meloxicam (MOBIC) 15 MG tablet  Chronic pain of both shoulders - Plan: PR DRAIN/INJECT LARGE JOINT/BURSA  Plan: Stretches/exercises, heat, ice, Tylenol. PT if no better. Cont stretches/exercises. Pt is option as well as specialty referral. Injection today in R shoulder.  F/u as originally scheduled.  The patient voiced understanding and agreement to the plan.   [place], DO 08/06/22  7:59 AM

## 2022-08-06 NOTE — Patient Instructions (Addendum)
Ice/cold pack over area for 10-15 min twice daily..  OK to take Tylenol 1000 mg (2 extra strength tabs) or 975 mg (3 regular strength tabs) every 6 hours as needed.  Let us know if you need anything.  Piriformis Syndrome Rehab It is normal to feel mild stretching, pulling, tightness, or discomfort as you do these exercises, but you should stop right away if you feel sudden pain or your pain gets worse.   Stretching and range of motion exercises These exercises warm up your muscles and joints and improve the movement and flexibility of your hip and pelvis. These exercises also help to relieve pain, numbness, and tingling. Exercise A: Hip rotators    Lie on your back on a firm surface. Pull your left / right knee toward your same shoulder with your left / right hand until your knee is pointing toward the ceiling. Hold your left / right ankle with your other hand. Keeping your knee steady, gently pull your left / right ankle toward your other shoulder until you feel a stretch in your buttocks. Hold this position for 30 seconds. Repeat 2 times. Complete this stretch 3 times per week. Exercise B: Hip extensors Lie on your back on a firm surface. Both of your legs should be straight. Pull your left / right knee to your chest. Hold your leg in this position by holding onto the back of your thigh or the front of your knee. Hold this position for 30 seconds. Slowly return to the starting position. Repeat 2 times. Complete this stretch 3 times per week.  Strengthening exercises These exercises build strength and endurance in your hip and thigh muscles. Endurance is the ability to use your muscles for a long time, even after they get tired. Exercise C: Straight leg raises (hip abductors)     Lie on your side with your left / right leg in the top position. Lie so your head, shoulder, knee, and hip line up. Bend your bottom knee to help you balance. Lift your top leg up 4-6 inches (10-15 cm),  keeping your toes pointed straight ahead. Hold this position for 1 second. Slowly lower your leg to the starting position. Let your muscles relax completely. Repeat for a total of 10 repetitions. Repeat 2 times. Complete this exercise 3 times per week. Exercise D: Hip abductors and rotators, quadruped    Get on your hands and knees on a firm, lightly padded surface. Your hands should be directly below your shoulders, and your knees should be directly below your hips. Lift your left / right knee out to the side. Keep your knee bent. Do not twist your body. Hold this position for 1 seconds. Slowly lower your leg. Repeat for a total of 10 repetitions.  Repeat 1 times. Complete this exercise 3 times per week. Exercise E: Straight leg raises (hip extensors) Lie on your abdomen on a bed or a firm surface with a pillow under your hips. Squeeze your buttock muscles and lift your left / right thigh off the bed. Do not let your back arch. Hold this position for 3 seconds. Slowly return to the starting position. Let your muscles relax completely before doing another repetition. Repeat 2 times. Complete this exercise 3 times per week.  This information is not intended to replace advice given to you by your health care provider. Make sure you discuss any questions you have with your health care provider. Document Released: 11/04/2005 Document Revised: 07/09/2016 Document Reviewed: 10/17/2015 Elsevier Interactive  Patient Education  [employer].

## 2022-08-08 ENCOUNTER — Other Ambulatory Visit: Payer: Self-pay | Admitting: Family Medicine

## 2022-08-08 DIAGNOSIS — R058 Other specified cough: Secondary | ICD-10-CM

## 2022-08-18 ENCOUNTER — Other Ambulatory Visit: Payer: Self-pay | Admitting: Family Medicine

## 2022-08-19 ENCOUNTER — Encounter: Payer: Self-pay | Admitting: Family Medicine

## 2022-08-19 ENCOUNTER — Ambulatory Visit: Payer: BC Managed Care – PPO | Admitting: Family Medicine

## 2022-08-19 VITALS — BP 120/80 | HR 81 | Temp 98.2°F | Ht 68.0 in | Wt 208.5 lb

## 2022-08-19 DIAGNOSIS — I8393 Asymptomatic varicose veins of bilateral lower extremities: Secondary | ICD-10-CM | POA: Diagnosis not present

## 2022-08-19 DIAGNOSIS — M25511 Pain in right shoulder: Secondary | ICD-10-CM | POA: Diagnosis not present

## 2022-08-19 DIAGNOSIS — G8929 Other chronic pain: Secondary | ICD-10-CM

## 2022-08-19 DIAGNOSIS — M25512 Pain in left shoulder: Secondary | ICD-10-CM | POA: Diagnosis not present

## 2022-08-19 MED ORDER — METHOCARBAMOL 750 MG PO TABS: ORAL_TABLET | ORAL | 0 refills | Status: DC

## 2022-08-19 NOTE — Progress Notes (Signed)
Musculoskeletal Exam  Patient: Sean Bowers DOB: 15-Jul-1956  DOS: 08/19/2022  SUBJECTIVE:  Chief Complaint:   Chief Complaint  Patient presents with   Knee Pain    Concerned about vascular problems   Hip Pain    Sean Bowers is a 66 y.o.  male for evaluation and treatment of L knee pain.   Onset:  2 days ago. No inj or change in activity.  Location: posterior knee Character:  aching  Progression of issue:  has improved Associated symptoms: varicose veins.  Treatment: to date has been ice and prescription NSAIDS.   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 Over calve and behind knees. No pain. Had ablation done around 2010 and has been wearing compression stockings since. They have been slowly returning. LE swelling contained w physical activity and compression stockings.   Past Medical History:  Diagnosis Date   Allergy    Arthritis    Asthma    no inhalers   Diverticulitis 09/2016   seen in Urgent Care   Genital warts    History of chicken pox    History of hay fever    Hyperlipidemia 09/27/2016   Hypertension     Objective: VITAL SIGNS: BP 120/80 (BP Location: Left Arm, Patient Position: Sitting, Cuff Size: Normal)   Pulse 81   Temp 98.2 F (36.8 C) (Oral)   Ht '5\' 8"'$  (1.727 m)   Wt 208 lb 8 oz (94.6 kg)   SpO2 95%   BMI 31.70 kg/m  Constitutional: Well formed, well developed. No acute distress. Thorax & Lungs: No accessory muscle use Musculoskeletal: L knee.   Normal active range of motion: yes.   Normal passive range of motion: yes Tenderness to palpation: no Deformity: no Ecchymosis: no Tests positive: none Tests negative: Lachman's, patellar app/grind, Stine's Neurologic: Normal sensory function. No focal deficits noted.  Skin: Varicose veins without ttp noted anterior LE's b/l Psychiatric: Normal mood. Age appropriate judgment and insight. Alert & oriented x 3.    Assessment:  Asymptomatic varicose veins of both lower  extremities - Plan: Ambulatory referral to Vascular Surgery  Chronic pain of both shoulders - Plan: methocarbamol (ROBAXIN) 750 MG tablet  Plan: Refer to vascular for varicose vein eval. Cont w compression stockings. Cont Robaxin for this and R hip. Knee improving.  F/u prn. The patient voiced understanding and agreement to the plan.   [place], DO 08/19/22  2:48 PM

## 2022-08-19 NOTE — Patient Instructions (Signed)
If you do not hear anything about your referral in the next 1-2 weeks, call our office and ask for an update.  Ice/cold pack over area for 10-15 min twice daily.  Continue wearing your compression stockings.   Let us know if you need anything.

## 2022-08-29 ENCOUNTER — Other Ambulatory Visit: Payer: Self-pay | Admitting: Family Medicine

## 2022-08-29 DIAGNOSIS — G5702 Lesion of sciatic nerve, left lower limb: Secondary | ICD-10-CM

## 2022-09-03 ENCOUNTER — Other Ambulatory Visit: Payer: Self-pay | Admitting: Family Medicine

## 2022-09-03 DIAGNOSIS — T464X5A Adverse effect of angiotensin-converting-enzyme inhibitors, initial encounter: Secondary | ICD-10-CM

## 2022-09-05 ENCOUNTER — Ambulatory Visit (INDEPENDENT_AMBULATORY_CARE_PROVIDER_SITE_OTHER): Payer: Self-pay | Admitting: Nurse Practitioner

## 2022-09-05 ENCOUNTER — Other Ambulatory Visit: Payer: Self-pay | Admitting: *Deleted

## 2022-09-05 DIAGNOSIS — I8393 Asymptomatic varicose veins of bilateral lower extremities: Secondary | ICD-10-CM

## 2022-09-05 DIAGNOSIS — Z23 Encounter for immunization: Secondary | ICD-10-CM

## 2022-09-20 ENCOUNTER — Ambulatory Visit: Payer: BC Managed Care – PPO | Admitting: Physician Assistant

## 2022-09-20 ENCOUNTER — Encounter: Payer: Self-pay | Admitting: Physician Assistant

## 2022-09-20 ENCOUNTER — Ambulatory Visit (HOSPITAL_COMMUNITY)
Admission: RE | Admit: 2022-09-20 | Discharge: 2022-09-20 | Disposition: A | Discharge status: Home / Self Care | Payer: BC Managed Care – PPO | Source: Ambulatory Visit | Attending: Vascular Surgery | Admitting: Vascular Surgery

## 2022-09-20 VITALS — BP 140/82 | HR 94 | Temp 98.0°F | Resp 20 | Ht 68.0 in | Wt 209.9 lb

## 2022-09-20 DIAGNOSIS — I8393 Asymptomatic varicose veins of bilateral lower extremities: Secondary | ICD-10-CM

## 2022-09-20 NOTE — Progress Notes (Signed)
Requested by:  Shelda Pal, [address] [place],  [place] [address]  Reason for consultation: varicose veins    History of Present Illness   Sean Bowers is a 66 y.o. (February 22, 1956) male who presents for evaluation of bulging varicose veins and swelling. He explains that over past year the veins have become more prominent. He has had prior issues with bleeding from his veins in the past but no recent episodes. However, he has had varicose veins and swelling in his legs for > 10 years. The swelling in the Right leg has always been more so than the left. He had bilateral GSV ablations 9 years ago. He wears thigh high 20-30 mmHg religiously daily. 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 He says for this reason he does not really have time or prioritize elevating his legs. He has no history of DVT. He does have family history of venous disease in his father.   Venous symptoms include:  swelling, bleeding Onset/duration:  > 10 years  Occupation:  Facilities management/ Maintenance Aggravating factors: prolonged standing Alleviating factors: compression  Compression:  thigh high compression Helps:  yes Pain medications:  no Previous vein procedures:  BLE venous ablations History of DVT:  no  Past Medical History:  Diagnosis Date   Allergy    Arthritis    Asthma    no inhalers   Diverticulitis 09/2016   seen in Urgent Care   Genital warts    History of chicken pox    History of hay fever    Hyperlipidemia 09/27/2016   Hypertension     Past Surgical History:  Procedure Laterality Date   CARPAL TUNNEL RELEASE Bilateral 2006-2007    Social History   Socioeconomic History   Marital status: Married    Spouse name: Not on file   Number of children: Not on file   Years of education: Not on file   Highest education level: Not on file  Occupational History   Not on file  Tobacco Use   Smoking status: Never     Passive exposure: Never   Smokeless tobacco: Never  Substance and Sexual Activity   Alcohol use: No   Drug use: No   Sexual activity: Not on file  Other Topics Concern   Not on file  Social History Narrative   Not on file   Social Determinants of Health   Financial Resource Strain: Not on file  Food Insecurity: Not on file  Transportation Needs: Not on file  Physical Activity: Not on file  Stress: Not on file  Social Connections: Not on file  Intimate Partner Violence: Not on file    Family History  Problem Relation Age of Onset   Diabetes Mother    Heart disease Mother    Hypertension Mother    Breast cancer Mother    Arthritis Mother    Cancer Mother        breast   Hyperlipidemia Mother    Heart disease Father    Stroke Father    Arthritis Father    Cancer Father        prostate   Hyperlipidemia Father    Heart disease Paternal Grandmother    Cancer Maternal Uncle        lung   Cancer Paternal Aunt        lung   Cancer Paternal Uncle  lung    Current Outpatient Medications  Medication Sig Dispense Refill   aspirin 81 MG tablet Take 81 mg by mouth daily.     loratadine (CLARITIN) 10 MG tablet Take 10 mg by mouth daily.     losartan-hydrochlorothiazide (HYZAAR) 100-25 MG tablet Take 1 tablet by mouth once daily 30 tablet 0   meloxicam (MOBIC) 15 MG tablet Take 1 tablet by mouth once daily 30 tablet 0   methocarbamol (ROBAXIN) 750 MG tablet TAKE 1 TABLET BY MOUTH EVERY 8 HOURS AS NEEDED FOR MUSCLE SPASM 60 tablet 0   Multiple Vitamins-Minerals (MULTIVITAMIN MEN PO) Take by mouth.     nitroGLYCERIN (NITROSTAT) 0.4 MG SL tablet Place 1 tablet (0.4 mg total) under the tongue every 5 (five) minutes x 3 doses as needed for chest pain. 25 tablet 1   potassium chloride (KLOR-CON 10) 10 MEQ tablet Take 1 tablet (10 mEq total) by mouth daily. 90 tablet 2   rosuvastatin (CRESTOR) 20 MG tablet Take 1 tablet by mouth once daily 90 tablet 0   tamsulosin (FLOMAX)  0.4 MG CAPS capsule Take 0.4 mg by mouth daily.     traZODone (DESYREL) 50 MG tablet TAKE 1/2 TO 1 (ONE-HALF TO ONE) TABLET BY MOUTH AT BEDTIME AS NEEDED FOR SLEEP 90 tablet 1   No current facility-administered medications for this visit.    Allergies  Allergen Reactions   Penicillins Swelling    Swelling of lips Has patient had a PCN reaction causing immediate rash, facial/tongue/throat swelling, SOB or lightheadedness with hypotension: Yes Has patient had a PCN reaction causing severe rash involving mucus membranes or skin necrosis: Yes Has patient had a PCN reaction that required hospitalization: No Has patient had a PCN reaction occurring within the last 10 years: No If all of the above answers are "NO", then may proceed with Cephalosporin use.     REVIEW OF SYSTEMS (negative unless checked):   Cardiac:  '[]'$  Chest pain or chest pressure? '[]'$  Shortness of breath upon activity? '[]'$  Shortness of breath when lying flat? '[]'$  Irregular heart rhythm?  Vascular:  '[]'$  Pain in calf, thigh, or hip brought on by walking? '[]'$  Pain in feet at night that wakes you up from your sleep? '[]'$  Blood clot in your veins? '[x]'$  Leg swelling?  Pulmonary:  '[]'$  Oxygen at home? '[]'$  Productive cough? '[]'$  Wheezing?  Neurologic:  '[]'$  Sudden weakness in arms or legs? '[]'$  Sudden numbness in arms or legs? '[]'$  Sudden onset of difficult speaking or slurred speech? '[]'$  Temporary loss of vision in one eye? '[]'$  Problems with dizziness?  Gastrointestinal:  '[]'$  Blood in stool? '[]'$  Vomited blood?  Genitourinary:  '[]'$  Burning when urinating? '[]'$  Blood in urine?  Psychiatric:  '[]'$  Major depression  Hematologic:  '[]'$  Bleeding problems? '[]'$  Problems with blood clotting?  Dermatologic:  '[]'$  Rashes or ulcers?  Constitutional:  '[]'$  Fever or chills?  Ear/Nose/Throat:  '[]'$  Change in hearing? '[]'$  Nose bleeds? '[]'$  Sore throat?  Musculoskeletal:  '[]'$  Back pain? '[]'$  Joint pain? '[]'$  Muscle pain?   Physical Examination      Vitals:   09/20/22 1232  BP: (!) 140/82  Pulse: 94  Resp: 20  Temp: 98 F (36.7 C)  TempSrc: Temporal  SpO2: 97%  Weight: 209 lb 14.4 oz (95.2 kg)  Height: '5\' 8"'$  (1.727 m)   Body mass index is 31.92 kg/m.  General:  WDWN in NAD; vital signs documented above Gait: Normal HENT: WNL, normocephalic Pulmonary: normal non-labored breathing , without wheezing Cardiac:  regular HR Vascular Exam/Pulses:2+ DP pulses Extremities: with varicose veins of left lateral/ anterior leg and right popliteal fossa/ proximal right posterior calf, with reticular veins of right and left lateral anterior legs, with edema right > left, with stasis pigmentation small area medial right distal leg, without lipodermatosclerosis, without ulcers     Musculoskeletal: no muscle wasting or atrophy  Neurologic: A&O X 3;  No focal weakness or paresthesias are detected Psychiatric:  The pt has Normal affect.  Non-invasive Vascular Imaging   BLE Venous Insufficiency Duplex (09/20/22):  RLE:  no DVT and SVT GSV reflux at Sfj and proximal thigh GSV diameter 0.32 cm No SSV reflux  No deep venous reflux   Medical Decision Making   Dong Nimmons is a 66 y.o. male who presents with: RLE chronic venous insufficiency with varicose veins and swelling. He has had prior ablation of both greater saphenous veins. He wears thigh high compression stockings daily. Duplex shows no DVT/ SVT. He does not have any deep reflux. He does have GSV reflux in the proximal GSV above where he has prior ablation. No SSV reflux.  Based on the patient's history and examination, I recommend: continued use of thigh high 20-30 mmHg compression, daily elevation, exercise, and refraining from prolonged sitting or standing. Patient is not a candidate for ablation based on his history and duplex today. He however has bilateral varicose veins that would benefit from stab phlebectomy vs stripping I will see if patients insurance will cover  procedure and follow up with him regarding intervention in the future Provided reassurance that he is not at risk for anything harmful to happen. He knows should he have any spontaneous bleeding to hold pressure on area to obtain hemostasis.    Karoline Caldwell, PA-C [facility] Office: [phone]  09/20/2022, 1:08 PM  Clinic MD: Virl Cagey

## 2022-09-26 ENCOUNTER — Encounter: Payer: Self-pay | Admitting: Family Medicine

## 2022-09-26 ENCOUNTER — Ambulatory Visit: Payer: BC Managed Care – PPO | Admitting: Family Medicine

## 2022-09-26 DIAGNOSIS — R059 Cough, unspecified: Secondary | ICD-10-CM | POA: Diagnosis not present

## 2022-09-26 DIAGNOSIS — R0981 Nasal congestion: Secondary | ICD-10-CM | POA: Diagnosis not present

## 2022-09-26 LAB — POCT INFLUENZA A/B
Influenza A, POC: NEGATIVE
Influenza B, POC: NEGATIVE

## 2022-09-26 LAB — POC COVID19 BINAXNOW: SARS Coronavirus 2 Ag: NEGATIVE

## 2022-09-26 MED ORDER — BENZONATATE 100 MG PO CAPS: 100.0000 mg | ORAL_CAPSULE | Freq: Two times a day (BID) | ORAL | 0 refills | Status: DC | PRN

## 2022-09-26 MED ORDER — FLUTICASONE PROPIONATE 50 MCG/ACT NA SUSP: 2.0000 | Freq: Every day | NASAL | 6 refills | Status: DC

## 2022-09-26 NOTE — Progress Notes (Signed)
Assessment/Plan:   Problem List Items Addressed This Visit   None Visit Diagnoses     Cough, unspecified type       Relevant Medications   benzonatate (TESSALON) 100 MG capsule   Other Relevant Orders   POC COVID-19 (Completed)   POCT Influenza A/B (Completed)   Nasal congestion       Relevant Medications   fluticasone (FLONASE) 50 MCG/ACT nasal spray       Patient with cough, likely viral etiology Without any respiratory distress We will treat symptomatically ED and return precaution discussed    Subjective:  HPI:  Sean Bowers is a 66 y.o. male who has Essential hypertension; Hyperlipidemia; Chest pain; Hypokalemia; Liver lesion, right lobe; Dyspnea; Mild intermittent asthma without complication; OSA on CPAP; Actinic keratosis; Angioma; Genital warts; Neck pain; Nevus; Asymptomatic varicose veins of both lower extremities; Chronic right-sided low back pain without sciatica; Pure hypercholesterolemia; Prostate cancer ([place]); Chronic cough; Chronic pain of both shoulders; Psychophysiological insomnia; and ACE-inhibitor cough on their problem list..   He  has a past medical history of Allergy, Arthritis, Asthma, Diverticulitis (09/2016), Genital warts, History of chicken pox, History of hay fever, Hyperlipidemia (09/27/2016), and Hypertension.Marland Kitchen   He presents with chief complaint of Cough (Cough, headache, runny nose x yesterday) .   Upper Respiratory Infection: Patient complains of symptoms of a URI. Symptoms include congestion, cough, and sore throat. Onset of symptoms was 3 days ago, gradually worsening since that time. He denies c/o achiness, hand/face or pedal edema, shortness of breath, sinus pressure, and chest pain .  He is drinking plenty of fluids. Evaluation to date: none. Treatment to date:  nyquil  without improvement.   Past Surgical History:  Procedure Laterality Date   CARPAL TUNNEL RELEASE Bilateral 2006-2007    Outpatient Medications Prior to Visit   Medication Sig Dispense Refill   aspirin 81 MG tablet Take 81 mg by mouth daily.     loratadine (CLARITIN) 10 MG tablet Take 10 mg by mouth daily.     losartan-hydrochlorothiazide (HYZAAR) 100-25 MG tablet Take 1 tablet by mouth once daily 30 tablet 0   meloxicam (MOBIC) 15 MG tablet Take 1 tablet by mouth once daily 30 tablet 0   methocarbamol (ROBAXIN) 750 MG tablet TAKE 1 TABLET BY MOUTH EVERY 8 HOURS AS NEEDED FOR MUSCLE SPASM 60 tablet 0   Multiple Vitamins-Minerals (MULTIVITAMIN MEN PO) Take by mouth.     nitroGLYCERIN (NITROSTAT) 0.4 MG SL tablet Place 1 tablet (0.4 mg total) under the tongue every 5 (five) minutes x 3 doses as needed for chest pain. 25 tablet 1   potassium chloride (KLOR-CON 10) 10 MEQ tablet Take 1 tablet (10 mEq total) by mouth daily. 90 tablet 2   rosuvastatin (CRESTOR) 20 MG tablet Take 1 tablet by mouth once daily 90 tablet 0   tamsulosin (FLOMAX) 0.4 MG CAPS capsule Take 0.4 mg by mouth daily.     traZODone (DESYREL) 50 MG tablet TAKE 1/2 TO 1 (ONE-HALF TO ONE) TABLET BY MOUTH AT BEDTIME AS NEEDED FOR SLEEP 90 tablet 1   No facility-administered medications prior to visit.    Family History  Problem Relation Age of Onset   Diabetes Mother    Heart disease Mother    Hypertension Mother    Breast cancer Mother    Arthritis Mother    Cancer Mother        breast   Hyperlipidemia Mother    Heart disease Father    Stroke  Father    Arthritis Father    Cancer Father        prostate   Hyperlipidemia Father    Heart disease Paternal Grandmother    Cancer Maternal Uncle        lung   Cancer Paternal Aunt        lung   Cancer Paternal Uncle        lung    Social History   Socioeconomic History   Marital status: Married    Spouse name: Not on file   Number of children: Not on file   Years of education: Not on file   Highest education level: Not on file  Occupational History   Not on file  Tobacco Use   Smoking status: Never    Passive exposure:  Never   Smokeless tobacco: Never  Substance and Sexual Activity   Alcohol use: No   Drug use: No   Sexual activity: Not on file  Other Topics Concern   Not on file  Social History Narrative   Not on file   Social Determinants of Health   Financial Resource Strain: Not on file  Food Insecurity: Not on file  Transportation Needs: Not on file  Physical Activity: Not on file  Stress: Not on file  Social Connections: Not on file  Intimate Partner Violence: Not on file                                                                                                 Objective:  Physical Exam: BP (!) 140/88 (BP Location: Left Arm, Patient Position: Sitting, Cuff Size: Large)   Pulse 78   Temp 98.9 F (37.2 C) (Oral)   Wt 209 lb 3.2 oz (94.9 kg)   SpO2 98%   BMI 31.81 kg/m    General: No acute distress. Awake and conversant.  Eyes: Normal conjunctiva, anicteric. Round symmetric pupils.  ENT: Hearing grossly intact. No nasal discharge. BLT TM normal, no oropharyngeal lesions, MMM Neck: Neck is supple. No masses or thyromegaly.  Respiratory: Respirations are non-labored. CTAB  Skin: Warm. No rashes or ulcers.  Psych: Alert and oriented. Cooperative, Appropriate mood and affect, Normal judgment.  CV: No cyanosis or JVD, RRR, No MRG ABD: nontender, nondistended MSK: Normal ambulation. No clubbing  Neuro: Sensation and CN II-XII grossly normal.  Results for orders placed or performed in visit on 09/26/22  POC COVID-19  Result Value Ref Range   SARS Coronavirus 2 Ag Negative Negative  POCT Influenza A/B  Result Value Ref Range   Influenza A, POC Negative Negative   Influenza B, POC Negative Negative         Alesia Banda, MD, MS

## 2022-09-28 ENCOUNTER — Other Ambulatory Visit: Payer: Self-pay | Admitting: Family Medicine

## 2022-09-28 DIAGNOSIS — G5702 Lesion of sciatic nerve, left lower limb: Secondary | ICD-10-CM

## 2022-10-04 ENCOUNTER — Other Ambulatory Visit: Payer: Self-pay | Admitting: Family Medicine

## 2022-10-04 DIAGNOSIS — G5702 Lesion of sciatic nerve, left lower limb: Secondary | ICD-10-CM

## 2022-10-18 ENCOUNTER — Other Ambulatory Visit: Payer: Self-pay | Admitting: Family Medicine

## 2022-10-18 DIAGNOSIS — R058 Other specified cough: Secondary | ICD-10-CM

## 2022-11-01 ENCOUNTER — Other Ambulatory Visit: Payer: Self-pay | Admitting: Family Medicine

## 2022-11-01 DIAGNOSIS — G5702 Lesion of sciatic nerve, left lower limb: Secondary | ICD-10-CM

## 2022-11-12 ENCOUNTER — Ambulatory Visit (INDEPENDENT_AMBULATORY_CARE_PROVIDER_SITE_OTHER)
Admission: RE | Admit: 2022-11-12 | Discharge: 2022-11-12 | Disposition: A | Discharge status: Home / Self Care | Payer: BC Managed Care – PPO | Source: Ambulatory Visit | Attending: Family Medicine | Admitting: Family Medicine

## 2022-11-12 ENCOUNTER — Ambulatory Visit: Payer: BC Managed Care – PPO | Admitting: Family Medicine

## 2022-11-12 ENCOUNTER — Encounter: Payer: Self-pay | Admitting: Family Medicine

## 2022-11-12 ENCOUNTER — Other Ambulatory Visit: Payer: Self-pay | Admitting: Family Medicine

## 2022-11-12 VITALS — BP 128/86 | HR 80 | Temp 98.1°F | Resp 18 | Ht 68.0 in | Wt 220.4 lb

## 2022-11-12 DIAGNOSIS — R0600 Dyspnea, unspecified: Secondary | ICD-10-CM

## 2022-11-12 DIAGNOSIS — J45909 Unspecified asthma, uncomplicated: Secondary | ICD-10-CM

## 2022-11-12 DIAGNOSIS — R0602 Shortness of breath: Secondary | ICD-10-CM | POA: Diagnosis not present

## 2022-11-12 MED ORDER — FLUTICASONE PROPIONATE HFA 110 MCG/ACT IN AERO: 2.0000 | INHALATION_SPRAY | Freq: Two times a day (BID) | RESPIRATORY_TRACT | 1 refills | Status: DC

## 2022-11-12 NOTE — Progress Notes (Signed)
Chief Complaint  Patient presents with   Asthma    Shortness of breath  Onset a couple of months     Subjective: Patient is a 66 y.o. male here for SOB.  Patient has a history of asthma.  Over the past 2 months, he has been having more shortness of breath.  He feels like he needs to take a deep breath.  He is not on any maintenance inhalers.  It is not exertional.  No association with meals or position.  No weight loss.  He has gained weight since his last visit but blames it on the holidays.  He is not swelling more than usual.  No chest pain, fevers, wheezing, sig coughing. No obvious trigger or event that started this.   Past Medical History:  Diagnosis Date   Allergy    Arthritis    Asthma    no inhalers   Diverticulitis 09/2016   seen in Urgent Care   Genital warts    History of chicken pox    History of hay fever    Hyperlipidemia 09/27/2016   Hypertension     Objective: BP 128/86 (BP Location: Left Arm, Patient Position: Sitting, Cuff Size: Large)   Pulse 80   Temp 98.1 F (36.7 C) (Oral)   Resp 18   Ht '5\' 8"'$  (1.727 m)   Wt 220 lb 6.4 oz (100 kg)   SpO2 96%   BMI 33.51 kg/m  General: Awake, appears stated age HEENT: Ears are patent without otorrhea, TMs negative, nares are patent without rhinorrhea, MMM, no postnasal drainage or pharyngeal erythema/exudate Heart: RRR, 2+ pitting bilateral LE edema tapering at the knees Lungs: CTAB, no rales, wheezes or rhonchi. No accessory muscle use Psych: Age appropriate judgment and insight, normal affect and mood  Assessment and Plan: Dyspnea, unspecified type - Plan: DG Chest 2 View  Uncomplicated asthma, unspecified asthma severity, unspecified whether persistent - Plan: fluticasone (FLOVENT HFA) 110 MCG/ACT inhaler  Check a chest x-ray.  This is likely due to #2.  We will send him to the [place] office to avoid a facility fee. Chronic, suspect uncontrolled.  Start Flovent 2 puffs twice daily.  Rinse mouth out after use.   Follow-up pending the results of the x-ray. The patient voiced understanding and agreement to the plan.  [place], DO 11/12/22  4:40 PM

## 2022-11-12 NOTE — Patient Instructions (Addendum)
Rinse your mouth out after using the daily inhaler.   We will be in touch regarding your X-ray results.   Please get your X-ray done in the basement of our [place] office located on: [address], [place] [address]  You do not need an appointment for that location.   Let us know if you need anything.

## 2022-11-14 ENCOUNTER — Other Ambulatory Visit: Payer: Self-pay | Admitting: Family Medicine

## 2022-11-14 DIAGNOSIS — F5104 Psychophysiologic insomnia: Secondary | ICD-10-CM

## 2022-11-15 ENCOUNTER — Other Ambulatory Visit: Payer: Self-pay | Admitting: Family Medicine

## 2022-11-15 DIAGNOSIS — R058 Other specified cough: Secondary | ICD-10-CM

## 2022-12-13 ENCOUNTER — Other Ambulatory Visit: Payer: Self-pay | Admitting: Family Medicine

## 2022-12-13 DIAGNOSIS — R058 Other specified cough: Secondary | ICD-10-CM

## 2022-12-18 ENCOUNTER — Ambulatory Visit: Payer: BC Managed Care – PPO | Admitting: Family Medicine

## 2022-12-18 ENCOUNTER — Encounter: Payer: Self-pay | Admitting: Family Medicine

## 2022-12-18 VITALS — BP 148/82 | HR 80 | Temp 98.1°F | Resp 16 | Ht 68.0 in | Wt 216.0 lb

## 2022-12-18 DIAGNOSIS — U071 COVID-19: Secondary | ICD-10-CM | POA: Diagnosis not present

## 2022-12-18 DIAGNOSIS — R059 Cough, unspecified: Secondary | ICD-10-CM

## 2022-12-18 MED ORDER — GUAIFENESIN ER 600 MG PO TB12: 1200.0000 mg | ORAL_TABLET | Freq: Two times a day (BID) | ORAL | 1 refills | Status: DC

## 2022-12-18 MED ORDER — MOLNUPIRAVIR EUA 200MG CAPSULE: 4.0000 | ORAL_CAPSULE | Freq: Two times a day (BID) | ORAL | 0 refills | Status: AC

## 2022-12-18 MED ORDER — BENZONATATE 100 MG PO CAPS: 100.0000 mg | ORAL_CAPSULE | Freq: Two times a day (BID) | ORAL | 0 refills | Status: DC | PRN

## 2022-12-18 NOTE — Patient Instructions (Signed)
Over the counter medications that may be helpful for symptoms:  Guaifenesin 1200 mg extended release tabs twice daily, with plenty of water For cough and congestion Brand name: Mucinex   Pseudoephedrine 30 mg, one or two tabs every 4 to 6 hours For sinus congestion Brand name: Sudafed You must get this from the pharmacy counter.  Oxymetazoline nasal spray each morning, one spray in each nostril, for NO MORE THAN 3 days  For nasal and sinus congestion Brand name: Afrin Saline nasal spray or Saline Nasal Irrigation 3-5 times a day For nasal and sinus congestion Brand names: Ocean or AYR Fluticasone nasal spray, one spray in each nostril, each morning (after oxymetazoline and saline, if used) For nasal and sinus congestion Brand name: Flonase Warm salt water gargles  For sore throat Every few hours as needed Alternate ibuprofen 400-600 mg and acetaminophen 1000 mg every 4-6 hours For fever, body aches, headache Brand names: Motrin or Advil and Tylenol Dextromethorphan 12-hour cough version 30 mg every 12 hours  For cough Brand name: Delsym Stop all other cold medications for now (Nyquil, Dayquil, Tylenol Cold, Theraflu, etc) and other non-prescription cough/cold preparations. Many of these have the same ingredients listed above and could cause an overdose of medication.   Herbal treatments that have been shown to be helpful in some patients include: Vitamin C '1000mg'$  per day Vitamin D 4000iU per day Zinc '100mg'$  per day Quercetin 25-'500mg'$  twice a day Melatonin 5-'10mg'$  at bedtime  General Instructions Allow your body to rest Drink PLENTY of fluids Isolate yourself from everyone, even family, until test results have returned  If your COVID-19 test is positive Then you ARE INFECTED and you can pass the virus to others You must quarantine from others for a minimum of  10 days since symptoms started AND You are fever free for 24 hours WITHOUT any medication to reduce fever AND Your  symptoms are improving Do not go to the store or other public areas Do not go around household members who are not known to be infected with COVID-19 If you MUST leave your area of quarantine (example: go to a bathroom you share with others in your home), you must Wear a mask Wash your hands thoroughly Wipe down any surfaces you touch Do not share food, drinks, towels, or other items with other persons Dispose of your own tissues, food containers, etc  Once you have recovered, please continue good preventive care measures, including:  wearing a mask when in public wash your hands frequently avoid touching your face/nose/eyes cover coughs/sneezes with the inside of your elbow stay out of crowds keep a 6 foot distance from others  If you develop severe shortness of breath, uncontrolled fevers, coughing up blood, confusion, chest pain, or signs of dehydration (such as significantly decreased urine amounts or dizziness with standing) please go to the ER.

## 2022-12-18 NOTE — Progress Notes (Signed)
Acute Office Visit  Subjective:     Patient ID: Sean Bowers, male    DOB: [birth year], 67 y.o.   MRN: [id number]  Chief Complaint  Patient presents with   Cough   Nasal Congestion   Covid Positive    Tested positive     Patient is in today for COVID infection.   Patient reports he started feeling poorly on Monday (2 days ago) and tested positive for COVID today. Symptoms have included cough with yellow sputum, congestion, headache, rhinorrhea, fatigue, malaise, trouble sleeping, occasional dyspnea. He denies fevers, chills, chest pain, wheezing, GI/GU symptoms. He has been taking tylenol and Nyquil with minimal improvement. He is interested in antiviral therapy.      All review of systems negative except what is listed in the HPI      Objective:    BP (!) 148/82   Pulse 80   Temp 98.1 F (36.7 C)   Resp 16   Ht '5\' 8"'$  (1.727 m)   Wt 216 lb (98 kg)   SpO2 97%   BMI 32.84 kg/m    Physical Exam Vitals reviewed.  Constitutional:      Appearance: Normal appearance.  Cardiovascular:     Rate and Rhythm: Normal rate and regular rhythm.     Pulses: Normal pulses.     Heart sounds: Normal heart sounds.  Pulmonary:     Effort: Pulmonary effort is normal.     Breath sounds: Normal breath sounds. No wheezing, rhonchi or rales.  Skin:    General: Skin is warm and dry.  Neurological:     Mental Status: He is alert and oriented to person, place, and time.  Psychiatric:        Mood and Affect: Mood normal.        Behavior: Behavior normal.        Thought Content: Thought content normal.        Judgment: Judgment normal.     No results found for any visits on 12/18/22.      Assessment & Plan:   Problem List Items Addressed This Visit   None Visit Diagnoses     COVID-19    -  Primary   Relevant Medications   molnupiravir EUA (LAGEVRIO) 200 mg CAPS capsule   benzonatate (TESSALON) 100 MG capsule   guaiFENesin (MUCINEX) 600 MG 12 hr tablet   Cough,  unspecified type       Relevant Medications   benzonatate (TESSALON) 100 MG capsule      Some potential med interactions with Paxlovid, so he would like to see if he can get molnupiravir affordably - if not, he will let us know and we will discuss options. Adding Tessalon and Mucinex. He has enough PRN albuterol inhaler. Continue supportive measures including rest, hydration, humidifier use, steam showers, warm compresses to sinuses, warm liquids with lemon and honey, and over-the-counter cough, cold, and analgesics as needed.   Additional information added to AVS.  Patient aware of signs/symptoms requiring further/urgent evaluation.    Meds ordered this encounter  Medications   molnupiravir EUA (LAGEVRIO) 200 mg CAPS capsule    Sig: Take 4 capsules (800 mg total) by mouth 2 (two) times daily for 5 days.    Dispense:  40 capsule    Refill:  0    Order Specific Question:   Supervising Provider    Answer:   Penni Homans A [[id number]]   benzonatate (TESSALON) 100 MG capsule    Sig: Take 1  capsule (100 mg total) by mouth 2 (two) times daily as needed for cough.    Dispense:  20 capsule    Refill:  0    Order Specific Question:   Supervising Provider    Answer:   Penni Homans A [[id number]]   guaiFENesin (MUCINEX) 600 MG 12 hr tablet    Sig: Take 2 tablets (1,200 mg total) by mouth 2 (two) times daily.    Dispense:  30 tablet    Refill:  1    Order Specific Question:   Supervising Provider    Answer:   Penni Homans A [[id number]]    Return if symptoms worsen or fail to improve.  Terrilyn Saver, NP

## 2022-12-25 ENCOUNTER — Encounter: Payer: Self-pay | Admitting: Family Medicine

## 2022-12-25 DIAGNOSIS — R059 Cough, unspecified: Secondary | ICD-10-CM

## 2022-12-25 DIAGNOSIS — U071 COVID-19: Secondary | ICD-10-CM

## 2022-12-26 MED ORDER — BENZONATATE 100 MG PO CAPS: 100.0000 mg | ORAL_CAPSULE | Freq: Two times a day (BID) | ORAL | 0 refills | Status: DC | PRN

## 2023-01-03 ENCOUNTER — Other Ambulatory Visit: Payer: Self-pay | Admitting: Family Medicine

## 2023-01-03 DIAGNOSIS — G5702 Lesion of sciatic nerve, left lower limb: Secondary | ICD-10-CM

## 2023-01-20 ENCOUNTER — Other Ambulatory Visit: Payer: Self-pay | Admitting: Family Medicine

## 2023-01-20 DIAGNOSIS — T464X5A Adverse effect of angiotensin-converting-enzyme inhibitors, initial encounter: Secondary | ICD-10-CM

## 2023-01-29 ENCOUNTER — Ambulatory Visit: Payer: BC Managed Care – PPO | Admitting: Family Medicine

## 2023-01-29 ENCOUNTER — Encounter: Payer: Self-pay | Admitting: Family Medicine

## 2023-01-29 VITALS — BP 119/70 | HR 75 | Temp 98.1°F | Resp 18 | Ht 68.0 in | Wt 215.8 lb

## 2023-01-29 DIAGNOSIS — J069 Acute upper respiratory infection, unspecified: Secondary | ICD-10-CM | POA: Diagnosis not present

## 2023-01-29 LAB — POCT INFLUENZA A/B
Influenza A, POC: NEGATIVE
Influenza B, POC: NEGATIVE

## 2023-01-29 LAB — POC COVID19 BINAXNOW: SARS Coronavirus 2 Ag: NEGATIVE

## 2023-01-29 MED ORDER — GUAIFENESIN ER 600 MG PO TB12: 1200.0000 mg | ORAL_TABLET | Freq: Two times a day (BID) | ORAL | 1 refills | Status: DC

## 2023-01-29 MED ORDER — BENZONATATE 100 MG PO CAPS: 100.0000 mg | ORAL_CAPSULE | Freq: Two times a day (BID) | ORAL | 0 refills | Status: DC | PRN

## 2023-01-29 NOTE — Progress Notes (Signed)
Acute Office Visit  Subjective:     Patient ID: Sean Bowers, male    DOB: Feb 08, 1956, 67 y.o.   MRN: [id number]  Chief Complaint  Patient presents with   Cough    Negative COVID- 01-28-2023 Onset 01/27/2023 Yellow mucus        Upper Respiratory Infection: Patient complains of symptoms of a URI. Symptoms include  cough, congestion, wheezing, yellow sputum, rhinorrhea, mild headache/sinus pressure . Onset of symptoms was 2 days ago, gradually worsening since that time.   He is drinking plenty of fluids. Evaluation to date: none. Treatment to date:  Mucinex . No fevers, chills, chest pain, dyspnea, hemoptysis, GI/GU symptoms.  Daughter with Flu A last week.     All review of systems negative except what is listed in the HPI      Objective:    BP 119/70 (BP Location: Left Arm, Patient Position: Sitting, Cuff Size: Large)   Pulse 75   Temp 98.1 F (36.7 C) (Oral)   Resp 18   Ht '5\' 8"'$  (1.727 m)   Wt 215 lb 12.8 oz (97.9 kg)   SpO2 97%   BMI 32.81 kg/m    Physical Exam Vitals reviewed.  Constitutional:      Appearance: Normal appearance.  HENT:     Head: Normocephalic and atraumatic.     Right Ear: Tympanic membrane normal.     Left Ear: Tympanic membrane normal.     Nose: Congestion and rhinorrhea present.     Mouth/Throat:     Pharynx: No oropharyngeal exudate.  Eyes:     Extraocular Movements: Extraocular movements intact.     Conjunctiva/sclera: Conjunctivae normal.  Cardiovascular:     Rate and Rhythm: Normal rate and regular rhythm.     Pulses: Normal pulses.     Heart sounds: Normal heart sounds.  Pulmonary:     Effort: Pulmonary effort is normal.     Breath sounds: Normal breath sounds. No wheezing, rhonchi or rales.  Musculoskeletal:     Cervical back: Normal range of motion and neck supple. No tenderness.  Lymphadenopathy:     Cervical: No cervical adenopathy.  Skin:    General: Skin is warm and dry.  Neurological:     Mental Status: He  is alert and oriented to person, place, and time.  Psychiatric:        Mood and Affect: Mood normal.        Behavior: Behavior normal.        Thought Content: Thought content normal.        Judgment: Judgment normal.     Results for orders placed or performed in visit on 01/29/23  POC COVID-19  Result Value Ref Range   SARS Coronavirus 2 Ag Negative Negative  POCT Influenza A/B  Result Value Ref Range   Influenza A, POC Negative Negative   Influenza B, POC Negative Negative        Assessment & Plan:   Problem List Items Addressed This Visit   None Visit Diagnoses     Viral URI with cough    -  Primary   Relevant Medications   benzonatate (TESSALON) 100 MG capsule   guaiFENesin (MUCINEX) 600 MG 12 hr tablet   Other Relevant Orders   POC COVID-19 (Completed)   POCT Influenza A/B (Completed)     Flu and COVID testing are negative today.  Likely another viral infection. Typically your body is able to fight off a viral infection within the first 8-10  days of symptoms. If symptoms do not start improving by this time or get worse before then, please let us know so we can decide if antibiotics will be appropriate.  I am sending in your Tessalon cough pills and Mucinex.  Continue supportive measures including rest, hydration, humidifier use, steam showers, warm compresses to sinuses, warm liquids with lemon and honey, and over-the-counter cough, cold, and analgesics as needed.   Patient aware of signs/symptoms requiring further/urgent evaluation.   Meds ordered this encounter  Medications   benzonatate (TESSALON) 100 MG capsule    Sig: Take 1 capsule (100 mg total) by mouth 2 (two) times daily as needed for cough.    Dispense:  20 capsule    Refill:  0    Order Specific Question:   Supervising Provider    Answer:   Penni Homans A [[id number]]   guaiFENesin (MUCINEX) 600 MG 12 hr tablet    Sig: Take 2 tablets (1,200 mg total) by mouth 2 (two) times daily.    Dispense:  30 tablet     Refill:  1    Order Specific Question:   Supervising Provider    Answer:   Penni Homans A [[id number]]    Return if symptoms worsen or fail to improve.  Terrilyn Saver, NP

## 2023-01-29 NOTE — Patient Instructions (Signed)
Flu and COVID testing are negative today.  Likely another viral infection. Typically your body is able to fight off a viral infection within the first 8-10 days of symptoms. If symptoms do not start improving by this time or get worse before then, please let us know so we can decide if antibiotics will be appropriate.  I am sending in your Tessalon cough pills and Mucinex.  Continue supportive measures including rest, hydration, humidifier use, steam showers, warm compresses to sinuses, warm liquids with lemon and honey, and over-the-counter cough, cold, and analgesics as needed.   The following information is provided as a Human resources officer for ADULT patients only and does NOT take into account PREGNANCY, ALLERGIES, LIVER CONDITIONS, KIDNEY CONDITIONS, GASTROINTESTINAL CONDITIONS, OR PRESCRIPTION MEDICATION INTERACTIONS. Please be sure to ask your provider if the following are safe to take with your specific medical history, conditions, or current medication regimen if you are unsure.   Adult Basic Symptom Management   Congestion: Guaifenesin (Mucinex)- follow directions on packaging with a maximum dose of '2400mg'$  in a 24 hour period.  Pain/Fever: Ibuprofen '200mg'$  - '400mg'$  every 4-6 hours as needed (MAX '1200mg'$  in a 24 hour period) Pain/Fever: Tylenol '500mg'$  -'1000mg'$  every 6-8 hours as needed (MAX '3000mg'$  in a 24 hour period)  Cough: Dextromethorphan (Delsym)- follow directions on packing with a maximum dose of '120mg'$  in a 24 hour period.  Nasal Stuffiness: Saline nasal spray and/or Nettie Pot with sterile saline solution  Runny Nose: Fluticasone nasal spray (Flonase) OR Mometasone nasal spray (Nasonex) OR Triamcinolone Acetonide nasal spray (Nasacort)- follow directions on the packaging  Pain/Pressure: Warm washcloth to the face  Sore Throat: Warm salt water gargles  If you have allergies, you may also consider taking an oral antihistamine (like Zyrtec or Claritin) as these may also help with your  symptoms.  **Many medications will have more than one ingredient, be sure you are reading the packaging carefully and not taking more than one dose of the same kind of medication at the same time or too close together. It is OK to use formulas that have all of the ingredients you want, but do not take them in a combined medication and as separate dose too close together. If you have any questions, the pharmacist will be happy to help you decide what is safe.

## 2023-02-06 ENCOUNTER — Other Ambulatory Visit: Payer: Self-pay | Admitting: Family Medicine

## 2023-02-06 DIAGNOSIS — F5104 Psychophysiologic insomnia: Secondary | ICD-10-CM

## 2023-02-06 DIAGNOSIS — G5702 Lesion of sciatic nerve, left lower limb: Secondary | ICD-10-CM

## 2023-02-07 NOTE — Telephone Encounter (Signed)
Last OV--11/12/22 Last RF--11/12/22--#90 no refills

## 2023-02-16 ENCOUNTER — Other Ambulatory Visit: Payer: Self-pay | Admitting: Family Medicine

## 2023-02-16 DIAGNOSIS — R058 Other specified cough: Secondary | ICD-10-CM

## 2023-02-23 ENCOUNTER — Other Ambulatory Visit: Payer: Self-pay | Admitting: Family Medicine

## 2023-02-23 DIAGNOSIS — G5702 Lesion of sciatic nerve, left lower limb: Secondary | ICD-10-CM

## 2023-03-15 ENCOUNTER — Other Ambulatory Visit: Payer: Self-pay | Admitting: Family Medicine

## 2023-03-15 DIAGNOSIS — T464X5A Adverse effect of angiotensin-converting-enzyme inhibitors, initial encounter: Secondary | ICD-10-CM

## 2023-03-19 ENCOUNTER — Other Ambulatory Visit: Payer: Self-pay | Admitting: Family Medicine

## 2023-03-19 DIAGNOSIS — R058 Other specified cough: Secondary | ICD-10-CM

## 2023-03-19 MED ORDER — LOSARTAN POTASSIUM-HCTZ 100-25 MG PO TABS: 1.0000 | ORAL_TABLET | Freq: Every day | ORAL | 3 refills | Status: DC

## 2023-03-27 ENCOUNTER — Other Ambulatory Visit: Payer: Self-pay | Admitting: Family Medicine

## 2023-03-27 DIAGNOSIS — G5702 Lesion of sciatic nerve, left lower limb: Secondary | ICD-10-CM

## 2023-04-29 ENCOUNTER — Encounter: Payer: Self-pay | Admitting: Family Medicine

## 2023-04-29 DIAGNOSIS — T464X5A Adverse effect of angiotensin-converting-enzyme inhibitors, initial encounter: Secondary | ICD-10-CM

## 2023-04-30 MED ORDER — POTASSIUM CHLORIDE ER 10 MEQ PO TBCR: 10.0000 meq | EXTENDED_RELEASE_TABLET | Freq: Every day | ORAL | 3 refills | Status: DC

## 2023-04-30 MED ORDER — ROSUVASTATIN CALCIUM 20 MG PO TABS: 20.0000 mg | ORAL_TABLET | Freq: Every day | ORAL | 3 refills | Status: DC

## 2023-04-30 MED ORDER — LOSARTAN POTASSIUM-HCTZ 100-25 MG PO TABS: 1.0000 | ORAL_TABLET | Freq: Every day | ORAL | 3 refills | Status: DC

## 2023-05-02 ENCOUNTER — Other Ambulatory Visit: Payer: Self-pay | Admitting: Family Medicine

## 2023-05-11 ENCOUNTER — Other Ambulatory Visit: Payer: Self-pay | Admitting: Family Medicine

## 2023-05-11 DIAGNOSIS — F5104 Psychophysiologic insomnia: Secondary | ICD-10-CM

## 2023-05-12 NOTE — Telephone Encounter (Signed)
Last RF--02/07/23--#90

## 2023-05-14 ENCOUNTER — Ambulatory Visit: Payer: Medicare HMO | Admitting: Family Medicine

## 2023-05-14 ENCOUNTER — Other Ambulatory Visit: Payer: Self-pay | Admitting: Family Medicine

## 2023-05-14 MED ORDER — TAMSULOSIN HCL 0.4 MG PO CAPS: 0.4000 mg | ORAL_CAPSULE | Freq: Every day | ORAL | 0 refills | Status: DC

## 2023-05-20 ENCOUNTER — Ambulatory Visit (INDEPENDENT_AMBULATORY_CARE_PROVIDER_SITE_OTHER): Payer: Medicare HMO | Admitting: Family Medicine

## 2023-05-20 ENCOUNTER — Encounter: Payer: Self-pay | Admitting: Family Medicine

## 2023-05-20 VITALS — BP 128/83 | HR 63 | Temp 97.9°F | Ht 68.0 in | Wt 214.1 lb

## 2023-05-20 DIAGNOSIS — E7800 Pure hypercholesterolemia, unspecified: Secondary | ICD-10-CM | POA: Diagnosis not present

## 2023-05-20 DIAGNOSIS — Z23 Encounter for immunization: Secondary | ICD-10-CM

## 2023-05-20 DIAGNOSIS — I10 Essential (primary) hypertension: Secondary | ICD-10-CM | POA: Diagnosis not present

## 2023-05-20 LAB — COMPREHENSIVE METABOLIC PANEL WITH GFR
ALT: 28 U/L (ref 0–53)
AST: 27 U/L (ref 0–37)
Albumin: 4.4 g/dL (ref 3.5–5.2)
Alkaline Phosphatase: 38 U/L — ABNORMAL LOW (ref 39–117)
BUN: 15 mg/dL (ref 6–23)
CO2: 29 meq/L (ref 19–32)
Calcium: 9.9 mg/dL (ref 8.4–10.5)
Chloride: 100 meq/L (ref 96–112)
Creatinine, Ser: 0.86 mg/dL (ref 0.40–1.50)
GFR: 90.48 mL/min
Glucose, Bld: 98 mg/dL (ref 70–99)
Potassium: 3.7 meq/L (ref 3.5–5.1)
Sodium: 139 meq/L (ref 135–145)
Total Bilirubin: 1.2 mg/dL (ref 0.2–1.2)
Total Protein: 6.8 g/dL (ref 6.0–8.3)

## 2023-05-20 LAB — LIPID PANEL
Cholesterol: 118 mg/dL (ref 0–200)
HDL: 46.5 mg/dL (ref >39.00)
LDL Cholesterol: 58 mg/dL (ref 0–99)
NonHDL: 71.34
Total CHOL/HDL Ratio: 3
Triglycerides: 69 mg/dL (ref 0.0–149.0)
VLDL: 13.8 mg/dL (ref 0.0–40.0)

## 2023-05-20 NOTE — Patient Instructions (Addendum)
Give us 2-3 business days to get the results of your labs back.  ? ?Keep the diet clean and stay active. ? ?The Shingrix vaccine (for shingles) is a 2 shot series spaced 2-6 months apart. It can make people feel low energy, achy and almost like they have the flu for 48 hours after injection. 1/5 people can have nausea and/or vomiting. Please plan accordingly when deciding on when to get this shot. Call your pharmacy to get this. The second shot of the series is less severe regarding the side effects, but it still lasts 48 hours.  ? ?Let us know if you need anything. ?

## 2023-05-20 NOTE — Addendum Note (Signed)
Addended by: Scharlene Gloss B on: 05/20/2023 08:14 AM   Modules accepted: Orders

## 2023-05-20 NOTE — Progress Notes (Signed)
Chief Complaint  Patient presents with   Follow-up    6 month     Subjective Sean Bowers is a 67 y.o. male who presents for hypertension follow up. He does not monitor home blood pressures. He is compliant with medications- Hyzaar 100-25 mg/d. Patient has these side effects of medication: none He is usually adhering to a healthy diet overall. Current exercise: active at work, walking No CP or SOB.   Hyperlipidemia Patient presents for hyperlipidemia follow up. Currently being treated with Crestor 20 mg/d and compliance with treatment thus far has been good. He denies myalgias. Diet/exercise as above.  The patient is not known to have coexisting coronary artery disease.   Past Medical History:  Diagnosis Date   Allergy    Arthritis    Asthma    no inhalers   Diverticulitis 09/2016   seen in Urgent Care   Genital warts    History of chicken pox    History of hay fever    Hyperlipidemia 09/27/2016   Hypertension     Exam BP 128/83 (BP Location: Left Arm, Patient Position: Sitting, Cuff Size: Normal)   Pulse 63   Temp 97.9 F (36.6 C) (Oral)   Ht 5\' 8"  (1.727 m)   Wt 214 lb 2 oz (97.1 kg)   SpO2 97%   BMI 32.56 kg/m  General:  well developed, well nourished, in no apparent distress Heart: RRR, no bruits, no LE edema Lungs: clear to auscultation, no accessory muscle use Psych: well oriented with normal range of affect and appropriate judgment/insight  Essential hypertension  Pure hypercholesterolemia - Plan: Comprehensive metabolic panel, Lipid panel  Chronic, stable.  Continue Hyzaar 100-25 mg daily.  Counseled on diet and exercise. Chronic, stable.  Continue Crestor 20 mg daily. PCV 20 today.  Shingrix recommended to get at the pharmacy. F/u in 6 mo. The patient voiced understanding and agreement to the plan.  Jilda Roche Craig, DO 05/20/23  8:09 AM

## 2023-05-21 DIAGNOSIS — C61 Malignant neoplasm of prostate: Secondary | ICD-10-CM | POA: Diagnosis not present

## 2023-05-21 NOTE — Addendum Note (Signed)
Addended by: Niza Soderholm P on: 05/21/2023 05:07 PM   Modules accepted: Level of Service  

## 2023-05-29 ENCOUNTER — Other Ambulatory Visit: Payer: Self-pay | Admitting: Family Medicine

## 2023-05-29 DIAGNOSIS — R3912 Poor urinary stream: Secondary | ICD-10-CM | POA: Diagnosis not present

## 2023-05-29 DIAGNOSIS — R058 Other specified cough: Secondary | ICD-10-CM

## 2023-05-29 DIAGNOSIS — N401 Enlarged prostate with lower urinary tract symptoms: Secondary | ICD-10-CM | POA: Diagnosis not present

## 2023-05-29 DIAGNOSIS — C61 Malignant neoplasm of prostate: Secondary | ICD-10-CM | POA: Diagnosis not present

## 2023-06-05 ENCOUNTER — Other Ambulatory Visit: Payer: Self-pay | Admitting: Urology

## 2023-06-05 DIAGNOSIS — C61 Malignant neoplasm of prostate: Secondary | ICD-10-CM

## 2023-06-08 ENCOUNTER — Other Ambulatory Visit: Payer: Self-pay | Admitting: Family Medicine

## 2023-06-09 ENCOUNTER — Other Ambulatory Visit: Payer: Self-pay | Admitting: Family Medicine

## 2023-06-09 DIAGNOSIS — F5104 Psychophysiologic insomnia: Secondary | ICD-10-CM

## 2023-06-09 MED ORDER — TRAZODONE HCL 50 MG PO TABS
50.0000 mg | ORAL_TABLET | Freq: Every day | ORAL | 3 refills | Status: DC
Start: 2023-06-09 — End: 2024-05-31

## 2023-07-04 ENCOUNTER — Ambulatory Visit (INDEPENDENT_AMBULATORY_CARE_PROVIDER_SITE_OTHER): Payer: Medicare HMO | Admitting: Physician Assistant

## 2023-07-04 ENCOUNTER — Encounter: Payer: Self-pay | Admitting: Physician Assistant

## 2023-07-04 VITALS — BP 140/84 | HR 81 | Temp 98.3°F | Ht 68.0 in | Wt 217.6 lb

## 2023-07-04 DIAGNOSIS — M25571 Pain in right ankle and joints of right foot: Secondary | ICD-10-CM | POA: Diagnosis not present

## 2023-07-04 MED ORDER — MELOXICAM 15 MG PO TABS
15.0000 mg | ORAL_TABLET | Freq: Every day | ORAL | 0 refills | Status: AC
Start: 2023-07-04 — End: ?

## 2023-07-04 NOTE — Progress Notes (Signed)
Established patient visit   Patient: Sean Bowers   DOB: [birth year]   67 y.o. Male  MRN: [id number] Visit Date: 07/04/2023  Today's healthcare provider: Alfredia Ferguson, PA-C   Cc. Right ankle pain  Subjective    HPI  Pt reports right ankle pain for the last 8-9 days, associated with morning stiffness, some swelling in the evening. He has been using compression socks and icing the ankle. Denies injury, history of injury or surgery.   Medications: Outpatient Medications Prior to Visit  Medication Sig   aspirin 81 MG tablet Take 81 mg by mouth daily.   loratadine (CLARITIN) 10 MG tablet Take 10 mg by mouth daily.   losartan-hydrochlorothiazide (HYZAAR) 100-25 MG tablet TAKE 1 TABLET EVERY DAY   Multiple Vitamins-Minerals (MULTIVITAMIN MEN PO) Take by mouth.   nitroGLYCERIN (NITROSTAT) 0.4 MG SL tablet Place 1 tablet (0.4 mg total) under the tongue every 5 (five) minutes x 3 doses as needed for chest pain.   potassium chloride (KLOR-CON) 10 MEQ tablet Take 1 tablet by mouth once daily   rosuvastatin (CRESTOR) 20 MG tablet Take 1 tablet by mouth once daily   tamsulosin (FLOMAX) 0.4 MG CAPS capsule TAKE 1 CAPSULE EVERY DAY   traZODone (DESYREL) 50 MG tablet Take 1 tablet (50 mg total) by mouth at bedtime.   [DISCONTINUED] meloxicam (MOBIC) 15 MG tablet Take 1 tablet by mouth once daily   No facility-administered medications prior to visit.    Review of Systems  Constitutional:  Negative for fatigue and fever.  Respiratory:  Negative for cough and shortness of breath.   Cardiovascular:  Negative for chest pain, palpitations and leg swelling.  Musculoskeletal:  Positive for arthralgias.  Neurological:  Negative for dizziness and headaches.      Objective    BP (!) 140/84   Pulse 81   Temp 98.3 F (36.8 C)   Ht 5\' 8"  (1.727 m)   Wt 217 lb 9.6 oz (98.7 kg)   SpO2 96%   BMI 33.09 kg/m   Physical Exam Vitals reviewed.  Constitutional:      Appearance: He is not  ill-appearing.  HENT:     Head: Normocephalic.  Eyes:     Conjunctiva/sclera: Conjunctivae normal.  Cardiovascular:     Rate and Rhythm: Normal rate.  Pulmonary:     Effort: Pulmonary effort is normal. No respiratory distress.  Musculoskeletal:     Comments: No tenderness to right ankle, no visible edema, erythema.  Appropriately warm to touch  Visible varicose veins  Neurological:     General: No focal deficit present.     Mental Status: He is alert and oriented to person, place, and time.  Psychiatric:        Mood and Affect: Mood normal.        Behavior: Behavior normal.       No results found for any visits on 07/04/23.  Assessment & Plan      Acute right ankle pain Recommending continuing ice, compression, elevation Refilled meloxicam 15 mg daily for pain relief If no significant improvement would order xray of right ankle  - meloxicam (MOBIC) 15 MG tablet; Take 1 tablet (15 mg total) by mouth daily.  Dispense: 30 tablet; Refill: 0   Return if symptoms worsen or fail to improve.      I, Alfredia Ferguson, PA-C have reviewed all documentation for this visit. The documentation on  07/04/23   for the exam, diagnosis, procedures, and orders are  all accurate and complete.    Alfredia Ferguson, PA-C  [facility] [phone] (phone) [phone] (fax)  [facility]

## 2023-07-22 ENCOUNTER — Other Ambulatory Visit: Payer: Medicare HMO

## 2023-07-24 ENCOUNTER — Ambulatory Visit
Admission: RE | Admit: 2023-07-24 | Discharge: 2023-07-24 | Disposition: A | Discharge status: Home / Self Care | Payer: Medicare HMO | Source: Ambulatory Visit | Attending: Urology | Admitting: Urology

## 2023-07-24 DIAGNOSIS — R972 Elevated prostate specific antigen [PSA]: Secondary | ICD-10-CM | POA: Diagnosis not present

## 2023-07-24 DIAGNOSIS — C61 Malignant neoplasm of prostate: Secondary | ICD-10-CM

## 2023-07-24 MED ORDER — GADOPICLENOL 0.5 MMOL/ML IV SOLN
10.0000 mL | Freq: Once | INTRAVENOUS | Status: AC | PRN
  Administered 2023-07-24: 10 mL via INTRAVENOUS

## 2023-08-15 ENCOUNTER — Other Ambulatory Visit: Payer: Self-pay | Admitting: Physician Assistant

## 2023-08-15 DIAGNOSIS — M25571 Pain in right ankle and joints of right foot: Secondary | ICD-10-CM

## 2023-08-15 MED ORDER — MELOXICAM 15 MG PO TABS
15.0000 mg | ORAL_TABLET | Freq: Every day | ORAL | 0 refills | Status: DC
Start: 2023-08-15 — End: 2023-10-09

## 2023-10-09 ENCOUNTER — Other Ambulatory Visit: Payer: Self-pay | Admitting: Family Medicine

## 2023-10-09 DIAGNOSIS — M25571 Pain in right ankle and joints of right foot: Secondary | ICD-10-CM

## 2023-10-10 MED ORDER — MELOXICAM 15 MG PO TABS
15.0000 mg | ORAL_TABLET | Freq: Every day | ORAL | 0 refills | Status: DC
Start: 2023-10-10 — End: 2023-12-19

## 2023-11-25 ENCOUNTER — Ambulatory Visit (INDEPENDENT_AMBULATORY_CARE_PROVIDER_SITE_OTHER): Payer: Medicare HMO | Admitting: Family Medicine

## 2023-11-25 ENCOUNTER — Encounter: Payer: Self-pay | Admitting: Family Medicine

## 2023-11-25 VITALS — BP 138/84 | HR 78 | Temp 98.0°F | Resp 16 | Ht 68.0 in | Wt 218.6 lb

## 2023-11-25 DIAGNOSIS — I10 Essential (primary) hypertension: Secondary | ICD-10-CM | POA: Diagnosis not present

## 2023-11-25 DIAGNOSIS — Z0000 Encounter for general adult medical examination without abnormal findings: Secondary | ICD-10-CM

## 2023-11-25 LAB — COMPREHENSIVE METABOLIC PANEL
ALT: 25 U/L (ref 0–53)
AST: 26 U/L (ref 0–37)
Albumin: 4.5 g/dL (ref 3.5–5.2)
Alkaline Phosphatase: 45 U/L (ref 39–117)
BUN: 13 mg/dL (ref 6–23)
CO2: 32 meq/L (ref 19–32)
Calcium: 9.5 mg/dL (ref 8.4–10.5)
Chloride: 101 meq/L (ref 96–112)
Creatinine, Ser: 0.81 mg/dL (ref 0.40–1.50)
GFR: 91.8 mL/min (ref >60.00)
Glucose, Bld: 90 mg/dL (ref 70–99)
Potassium: 3.8 meq/L (ref 3.5–5.1)
Sodium: 140 meq/L (ref 135–145)
Total Bilirubin: 1.6 mg/dL — ABNORMAL HIGH (ref 0.2–1.2)
Total Protein: 6.9 g/dL (ref 6.0–8.3)

## 2023-11-25 LAB — LIPID PANEL
Cholesterol: 133 mg/dL (ref 0–200)
HDL: 46.9 mg/dL (ref >39.00)
LDL Cholesterol: 70 mg/dL (ref 0–99)
NonHDL: 86.41
Total CHOL/HDL Ratio: 3
Triglycerides: 84 mg/dL (ref 0.0–149.0)
VLDL: 16.8 mg/dL (ref 0.0–40.0)

## 2023-11-25 LAB — CBC
HCT: 44.8 % (ref 39.0–52.0)
Hemoglobin: 15.1 g/dL (ref 13.0–17.0)
MCHC: 33.8 g/dL (ref 30.0–36.0)
MCV: 88.9 fL (ref 78.0–100.0)
Platelets: 194 10*3/uL (ref 150.0–400.0)
RBC: 5.04 Mil/uL (ref 4.22–5.81)
RDW: 12.8 % (ref 11.5–15.5)
WBC: 6.1 10*3/uL (ref 4.0–10.5)

## 2023-11-25 MED ORDER — AMLODIPINE BESYLATE 5 MG PO TABS
5.0000 mg | ORAL_TABLET | Freq: Every day | ORAL | 3 refills | Status: DC
Start: 2023-11-25 — End: 2024-01-06

## 2023-11-25 NOTE — Patient Instructions (Addendum)
 Give us  2-3 business days to get the results of your labs back.   Keep the diet clean and stay active.  Please get me a copy of your advanced directive form at your convenience.   The Shingrix vaccine (for shingles) is a 2 shot series spaced 2-6 months apart. It can make people feel low energy, achy and almost like they have the flu for 48 hours after injection. 1/5 people can have nausea and/or vomiting. Please plan accordingly when deciding on when to get this shot. Call your pharmacy to get this. The second shot of the series is less severe regarding the side effects, but it still lasts 48 hours.   Let us  know if you need anything.  Piriformis Syndrome Rehab It is normal to feel mild stretching, pulling, tightness, or discomfort as you do these exercises, but you should stop right away if you feel sudden pain or your pain gets worse.   Stretching and range of motion exercises These exercises warm up your muscles and joints and improve the movement and flexibility of your hip and pelvis. These exercises also help to relieve pain, numbness, and tingling. Exercise A: Hip rotators    Lie on your back on a firm surface. Pull your left / right knee toward your same shoulder with your left / right hand until your knee is pointing toward the ceiling. Hold your left / right ankle with your other hand. Keeping your knee steady, gently pull your left / right ankle toward your other shoulder until you feel a stretch in your buttocks. Hold this position for 30 seconds. Repeat 2 times. Complete this stretch 3 times per week. Exercise B: Hip extensors Lie on your back on a firm surface. Both of your legs should be straight. Pull your left / right knee to your chest. Hold your leg in this position by holding onto the back of your thigh or the front of your knee. Hold this position for 30 seconds. Slowly return to the starting position. Repeat 2 times. Complete this stretch 3 times per  week.  Strengthening exercises These exercises build strength and endurance in your hip and thigh muscles. Endurance is the ability to use your muscles for a long time, even after they get tired. Exercise C: Straight leg raises (hip abductors)     Lie on your side with your left / right leg in the top position. Lie so your head, shoulder, knee, and hip line up. Bend your bottom knee to help you balance. Lift your top leg up 4-6 inches (10-15 cm), keeping your toes pointed straight ahead. Hold this position for 1 second. Slowly lower your leg to the starting position. Let your muscles relax completely. Repeat for a total of 10 repetitions. Repeat 2 times. Complete this exercise 3 times per week. Exercise D: Hip abductors and rotators, quadruped    Get on your hands and knees on a firm, lightly padded surface. Your hands should be directly below your shoulders, and your knees should be directly below your hips. Lift your left / right knee out to the side. Keep your knee bent. Do not twist your body. Hold this position for 1 seconds. Slowly lower your leg. Repeat for a total of 10 repetitions.  Repeat 1 times. Complete this exercise 3 times per week. Exercise E: Straight leg raises (hip extensors) Lie on your abdomen on a bed or a firm surface with a pillow under your hips. Squeeze your buttock muscles and lift your left /  right thigh off the bed. Do not let your back arch. Hold this position for 3 seconds. Slowly return to the starting position. Let your muscles relax completely before doing another repetition. Repeat 2 times. Complete this exercise 3 times per week.  This information is not intended to replace advice given to you by your health care provider. Make sure you discuss any questions you have with your health care provider. Document Released: 11/04/2005 Document Revised: 07/09/2016 Document Reviewed: 10/17/2015 Elsevier Interactive Patient Education  2018 [employer]     Wrist and Forearm Exercises Do exercises exactly as told by your health care provider and adjust them as directed. It is normal to feel mild stretching, pulling, tightness, or discomfort as you do these exercises, but you should stop right away if you feel sudden pain or your pain gets worse.   RANGE OF MOTION EXERCISES These exercises warm up your muscles and joints and improve the movement and flexibility of your injured wrist and forearm. These exercises also help to relieve pain, numbness, and tingling. These exercises are done using the muscles in your injured wrist and forearm. Exercise A: Wrist Flexion, Active With your fingers relaxed, bend your wrist forward as far as you can. Hold this position for 30 seconds. Repeat 2 times. Complete this exercise 3 times per week. Exercise B: Wrist Extension, Active With your fingers relaxed, bend your wrist backward as far as you can. Hold this position for 30 seconds. Repeat 2 times. Complete this exercise 3 times per week. Exercise C: Supination, Active  Stand or sit with your arms at your sides. Bend your left / right elbow to an L shape (90 degrees). Turn your palm upward until you feel a gentle stretch on the inside of your forearm. Hold this position for 30 seconds. Slowly return your palm to the starting position. Repeat 2 times. Complete this exercise 3 times per week. Exercise D: Pronation, Active  Stand or sit with your arms at your sides. Bend your left / right elbow to an L shape (90 degrees). Turn your palm downward until you feel a gentle stretch on the top of your forearm. Hold this position for 30 seconds. Slowly return your palm to the starting position. Repeat 2 times. Complete this exercise once a day.  STRETCHING EXERCISES These exercises warm up your muscles and joints and improve the movement and flexibility of your injured wrist and forearm. These exercises also help to relieve pain, numbness, and tingling.  These exercises are done using your healthy wrist and forearm to help stretch the muscles in your injured wrist and forearm. Exercise E: Wrist Flexion, Passive  Extend your left / right arm in front of you, relax your wrist, and point your fingers downward. Gently push on the back of your hand. Stop when you feel a gentle stretch on the top of your forearm. Hold this position for 30 seconds. Repeat 2 times. Complete this exercise 3 times per week. Exercise F: Wrist Extension, Passive  Extend your left / right arm in front of you and turn your palm upward. Gently pull your palm and fingertips back so your fingers point downward. You should feel a gentle stretch on the palm-side of your forearm. Hold this position for 30 seconds. Repeat 2 times. Complete this exercise 3 times per week. Exercise G: Forearm Rotation, Supination, Passive Sit with your left / right elbow bent to an L shape (90 degrees) with your forearm resting on a table. Keeping your upper body  and shoulder still, use your other hand to rotate your forearm palm-up until you feel a gentle to moderate stretch. Hold this position for 30 seconds. Slowly release the stretch and return to the starting position. Repeat 2 times. Complete this exercise 3 times per week. Exercise H: Forearm Rotation, Pronation, Passive Sit with your left / right elbow bent to an L shape (90 degrees) with your forearm resting on a table. Keeping your upper body and shoulder still, use your other hand to rotate your forearm palm-down until you feel a gentle to moderate stretch. Hold this position for 30 seconds. Slowly release the stretch and return to the starting position. Repeat 2 times. Complete this exercise 3 times per week.  STRENGTHENING EXERCISES These exercises build strength and endurance in your wrist and forearm. Endurance is the ability to use your muscles for a long time, even after they get tired. Exercise I: Wrist Flexors  Sit with  your left / right forearm supported on a table and your hand resting palm-up over the edge of the table. Your elbow should be bent to an L shape (about 90 degrees) and be below the level of your shoulder. Hold a 3-5 lb weight in your left / right hand. Or, hold a rubber exercise band or tube in both hands, keeping your hands at the same level and hip distance apart. There should be a slight tension in the exercise band or tube. Slowly curl your hand up toward your forearm. Hold this position for 3 seconds. Slowly lower your hand back to the starting position. Repeat 2 times. Complete this exercise 3 times per week. Exercise J: Wrist Extensors  Sit with your left / right forearm supported on a table and your hand resting palm-down over the edge of the table. Your elbow should be bent to an L shape (about 90 degrees) and be below the level of your shoulder. Hold a 3-5 lb weight in your left / right hand. Or, hold a rubber exercise band or tube in both hands, keeping your hands at the same level and hip distance apart. There should be a slight tension in the exercise band or tube. Slowly curl your hand up toward your forearm. Hold this position for 3 seconds. Slowly lower your hand back to the starting position. Repeat 2 times. Complete this exercise 3 times per week. Exercise K: Forearm Rotation, Supination  Sit with your left / right forearm supported on a table and your hand resting palm-down. Your elbow should be at your side, bent to an L shape (about 90 degrees), and below the level of your shoulder. Keep your wrist stable and in a neutral position throughout the exercise. Gently hold a lightweight hammer with your left / right hand. Without moving your elbow or wrist, slowly rotate your palm upward to a thumbs-up position. Hold this position for 3 seconds. Slowly return your forearm to the starting position. Repeat 2 times. Complete this exercise 3 times per week. Exercise L: Forearm  Rotation, Pronation  Sit with your left / right forearm supported on a table and your hand resting palm-up. Your elbow should be at your side, bent to an L shape (about 90 degrees), and below the level of your shoulder. Keep your wrist stable. Do not allow it to move backward or forward during the exercise. Gently hold a lightweight hammer with your left / right hand. Without moving your elbow or wrist, slowly rotate your palm and hand upward to a thumbs-up position.  Hold this position for 3 seconds. Slowly return your forearm to the starting position. Repeat 2 times. Complete this exercise 3 times per week. Exercise M: Grip Strengthening  Hold one of these items in your left / right hand: play dough, therapy putty, a dense sponge, a stress ball, or a large, rolled sock. Squeeze as hard as you can without increasing pain. Hold this position for 5 seconds. Slowly release your grip. Repeat 2 times. Complete this exercise 3 times per week.  This information is not intended to replace advice given to you by your health care provider. Make sure you discuss any questions you have with your health care provider. Document Released: 09/18/2005 Document Revised: 07/29/2016 Document Reviewed: 07/30/2015 Elsevier Interactive Patient Education  [employer].

## 2023-11-25 NOTE — Progress Notes (Signed)
 Chief Complaint  Patient presents with   Annual Exam    Annual Exam    Well Male Sean Bowers is here for a complete physical.   His last physical was >1 year ago.  Current diet: in general, a healthy diet.   Current exercise: active at work Weight trend: stable Fatigue out of ordinary? No. Seat belt? Yes.   Advanced directive? No  Health maintenance Shingrix- No Colonoscopy- Yes Tetanus- Yes Hep C- Yes Pneumonia vaccine- Yes  Hypertension Patient presents for hypertension follow up. He does not  monitor home blood pressures. He is compliant with medications- Hyzaar 100-25 mg/d. Patient has these side effects of medication: none Diet/exercise as above. No Cp or SOB.   Past Medical History:  Diagnosis Date   Allergy    Arthritis    Asthma    no inhalers   Diverticulitis 09/2016   seen in Urgent Care   Genital warts    History of chicken pox    History of hay fever    Hyperlipidemia 09/27/2016   Hypertension      Past Surgical History:  Procedure Laterality Date   CARPAL TUNNEL RELEASE Bilateral 2006-2007    Medications  Current Outpatient Medications on File Prior to Visit  Medication Sig Dispense Refill   aspirin  81 MG tablet Take 81 mg by mouth daily.     loratadine (CLARITIN) 10 MG tablet Take 10 mg by mouth daily.     losartan -hydrochlorothiazide  (HYZAAR) 100-25 MG tablet TAKE 1 TABLET EVERY DAY 90 tablet 3   meloxicam  (MOBIC ) 15 MG tablet Take 1 tablet (15 mg total) by mouth daily. 90 tablet 0   Multiple Vitamins-Minerals (MULTIVITAMIN MEN PO) Take by mouth.     potassium chloride  (KLOR-CON ) 10 MEQ tablet Take 1 tablet by mouth once daily 90 tablet 0   rosuvastatin  (CRESTOR ) 20 MG tablet Take 1 tablet by mouth once daily 90 tablet 0   tamsulosin  (FLOMAX ) 0.4 MG CAPS capsule TAKE 1 CAPSULE EVERY DAY 90 capsule 3   traZODone  (DESYREL ) 50 MG tablet Take 1 tablet (50 mg total) by mouth at bedtime. 90 tablet 3   No current facility-administered  medications on file prior to visit.     Allergies Allergies  Allergen Reactions   Penicillins Swelling    Swelling of lips Has patient had a PCN reaction causing immediate rash, facial/tongue/throat swelling, SOB or lightheadedness with hypotension: Yes Has patient had a PCN reaction causing severe rash involving mucus membranes or skin necrosis: Yes Has patient had a PCN reaction that required hospitalization: No Has patient had a PCN reaction occurring within the last 10 years: No If all of the above answers are NO, then may proceed with Cephalosporin use.     Family History Family History  Problem Relation Age of Onset   Diabetes Mother    Heart disease Mother    Hypertension Mother    Breast cancer Mother    Arthritis Mother    Cancer Mother        breast   Hyperlipidemia Mother    Heart disease Father    Stroke Father    Arthritis Father    Cancer Father        prostate   Hyperlipidemia Father    Heart disease Paternal Grandmother    Cancer Maternal Uncle        lung   Cancer Paternal Aunt        lung   Cancer Paternal Uncle  lung    Review of Systems: Constitutional:  no fevers Eye:  no recent significant change in vision Ears:  No changes in hearing Nose/Mouth/Throat:  no complaints of nasal congestion, no sore throat Cardiovascular: no chest pain Respiratory:  No shortness of breath Gastrointestinal:  No change in bowel habits GU:  No frequency Integumentary:  no abnormal skin lesions reported Neurologic:  no headaches Endocrine:  denies unexplained weight changes  Exam BP 138/84   Pulse 78   Temp 98 F (36.7 C) (Oral)   Resp 16   Ht 5' 8 (1.727 m)   Wt 218 lb 9.6 oz (99.2 kg)   SpO2 95%   BMI 33.24 kg/m  General:  well developed, well nourished, in no apparent distress Skin:  no significant moles, warts, or growths Head:  no masses, lesions, or tenderness Eyes:  pupils equal and round, sclera anicteric without injection Ears:   canals without lesions, TMs shiny without retraction, no obvious effusion, no erythema Nose:  nares patent, mucosa normal Throat/Pharynx:  lips and gingiva without lesion; tongue and uvula midline; non-inflamed pharynx; no exudates or postnasal drainage Lungs:  clear to auscultation, breath sounds equal bilaterally, no respiratory distress Cardio:  regular rate and rhythm, no LE edema or bruits Rectal: Deferred GI: BS+, S, NT, ND, no masses or organomegaly Musculoskeletal:  symmetrical muscle groups noted without atrophy or deformity Neuro:  gait normal; deep tendon reflexes normal and symmetric Psych: well oriented with normal range of affect and appropriate judgment/insight  Assessment and Plan  Well adult exam  Essential hypertension - Plan: CBC, Comprehensive metabolic panel, Lipid panel, amLODipine  (NORVASC ) 5 MG tablet   Well 68 y.o. male. Counseled on diet and exercise. Advanced directive form provided today.  Shingrix rec'd.  HTN: Chronic, uncontrolled. Rechecked BP, elevated. He has not been as good w his diet. Cont Hyzaar 100-25 mg/d, add Norvasc  5 mg/d monitor BP at home. F/u in 1 mo.  Other orders as above. The patient voiced understanding and agreement to the plan.  Sean [place], DO 11/25/23 8:58 AM

## 2023-11-27 DIAGNOSIS — C61 Malignant neoplasm of prostate: Secondary | ICD-10-CM | POA: Diagnosis not present

## 2023-11-27 DIAGNOSIS — R3912 Poor urinary stream: Secondary | ICD-10-CM | POA: Diagnosis not present

## 2023-11-27 DIAGNOSIS — N5201 Erectile dysfunction due to arterial insufficiency: Secondary | ICD-10-CM | POA: Diagnosis not present

## 2023-11-27 DIAGNOSIS — N401 Enlarged prostate with lower urinary tract symptoms: Secondary | ICD-10-CM | POA: Diagnosis not present

## 2023-12-19 ENCOUNTER — Other Ambulatory Visit: Payer: Self-pay | Admitting: Family Medicine

## 2023-12-19 DIAGNOSIS — M25571 Pain in right ankle and joints of right foot: Secondary | ICD-10-CM

## 2024-01-06 ENCOUNTER — Encounter: Payer: Self-pay | Admitting: Family Medicine

## 2024-01-06 ENCOUNTER — Ambulatory Visit: Payer: Medicare HMO | Admitting: Family Medicine

## 2024-01-06 ENCOUNTER — Ambulatory Visit (INDEPENDENT_AMBULATORY_CARE_PROVIDER_SITE_OTHER): Payer: Medicare HMO | Admitting: Family Medicine

## 2024-01-06 VITALS — BP 138/80 | HR 71 | Temp 98.0°F | Resp 16 | Ht 68.0 in | Wt 220.6 lb

## 2024-01-06 DIAGNOSIS — R0683 Snoring: Secondary | ICD-10-CM

## 2024-01-06 DIAGNOSIS — I10 Essential (primary) hypertension: Secondary | ICD-10-CM | POA: Diagnosis not present

## 2024-01-06 MED ORDER — CARVEDILOL 12.5 MG PO TABS: 12.5000 mg | ORAL_TABLET | Freq: Two times a day (BID) | ORAL | 3 refills | Status: DC

## 2024-01-06 NOTE — Progress Notes (Signed)
Chief Complaint  Patient presents with   Follow-up    Follow up    Subjective Sean Bowers is a 68 y.o. male who presents for hypertension follow up. He does monitor home blood pressures. Blood pressures ranging from 130-140's/80's on average. He is compliant with medications- Hyzaar 100-25 mg/d, Norvasc 5 mg/d. Patient has these side effects of medication: none He is sometimes adhering to a healthy diet overall. Current exercise: none No CP or SOB.   Pt has a hx of snoring. He would like to have another sleep study. Has had a sleep study in past. +fatigue.    Past Medical History:  Diagnosis Date   Allergy    Arthritis    Asthma    no inhalers   Diverticulitis 09/2016   seen in Urgent Care   Genital warts    History of chicken pox    History of hay fever    Hyperlipidemia 09/27/2016   Hypertension     Exam BP 138/80 (BP Location: Left Arm, Patient Position: Sitting)   Pulse 71   Temp 98 F (36.7 C) (Oral)   Resp 16   Ht 5\' 8"  (1.727 m)   Wt 220 lb 9.6 oz (100.1 kg)   SpO2 96%   BMI 33.54 kg/m  General:  well developed, well nourished, in no apparent distress Heart: RRR, no bruits, 2+ bilateral LE edema tapering at the proximal tibia Lungs: clear to auscultation, no accessory muscle use Psych: well oriented with normal range of affect and appropriate judgment/insight  Essential hypertension - Plan: carvedilol (COREG) 12.5 MG tablet  Snoring - Plan: Ambulatory referral to Pulmonology  Chronic, uncontrolled. Cont Hyzaar 100-25 mg/d, stop Norvasc due to failure to improve.  Start Coreg 12.5 mg twice daily.  Monitor blood pressure at home.  Counseled on diet and exercise. Will refer back to the sleep team at his request. F/u in 1 month to recheck #1. The patient voiced understanding and agreement to the plan.  Jilda Roche [place], DO 01/06/24  9:16 AM

## 2024-01-06 NOTE — Patient Instructions (Addendum)
Keep the diet clean and stay active.  Aim to do some physical exertion for 150 minutes per week. This is typically divided into 5 days per week, 30 minutes per day. The activity should be enough to get your heart rate up. Anything is better than nothing if you have time constraints.  If you do not hear anything about your referral in the next 1-2 weeks, call our office and ask for an update.  If you are having trouble taking the 2nd dosage of the Coreg let me know.   Let us know if you need anything.

## 2024-01-13 ENCOUNTER — Institutional Professional Consult (permissible substitution): Payer: Medicare HMO | Admitting: Sleep Medicine

## 2024-01-20 ENCOUNTER — Encounter: Payer: Self-pay | Admitting: Sleep Medicine

## 2024-01-20 ENCOUNTER — Ambulatory Visit: Payer: Medicare HMO | Admitting: Sleep Medicine

## 2024-01-20 VITALS — BP 122/68 | HR 61 | Temp 96.9°F | Ht 68.0 in | Wt 223.4 lb

## 2024-01-20 DIAGNOSIS — G4733 Obstructive sleep apnea (adult) (pediatric): Secondary | ICD-10-CM | POA: Diagnosis not present

## 2024-01-20 DIAGNOSIS — I10 Essential (primary) hypertension: Secondary | ICD-10-CM | POA: Diagnosis not present

## 2024-01-20 DIAGNOSIS — Z6833 Body mass index (BMI) 33.0-33.9, adult: Secondary | ICD-10-CM

## 2024-01-20 DIAGNOSIS — E66811 Obesity, class 1: Secondary | ICD-10-CM | POA: Diagnosis not present

## 2024-01-20 NOTE — Progress Notes (Signed)
 Name:Sean Bowers MRN: [id number] DOB: 1956-01-04   CHIEF COMPLAINT:  REASSESSMENT OF OSA   HISTORY OF PRESENT ILLNESS:  Sean Bowers is a 68 y.o. w/ a h/o OSA, obesity, HTN and hyperlipidemia who presents for reassessment of OSA. Reports that he was initially diagnosed with mild OSA in 2021 and subsequently started on CPAP therapy which he used for a few years. States that he discontinued CPAP therapy due to it being an inconvenience. Reports snoring. Reports nocturnal awakenings due to nocturia, however does not have difficulty falling back to sleep. Denies any significant weight changes. Denies morning headaches, RLS symptoms, dream enactment, cataplexy, hypnagogic or hypnapompic hallucinations. Denies a family history of sleep apnea. Denies drowsy driving. Drinks 4 cups of coffee daily, 3 cups of tea daily, denies alcohol, tobacco or illicit drug use.   Bedtime 10:30 pm Sleep onset 20 mins Rise time 6:30 am   EPWORTH SLEEP SCORE 5    01/20/2024    8:00 AM 03/23/2020    3:00 PM  Results of the Epworth flowsheet  Sitting and reading 0 1  Watching TV 1 1  Sitting, inactive in a public place (e.g. a theatre or a meeting) 1 0  As a passenger in a car for an hour without a break 0 1  Lying down to rest in the afternoon when circumstances permit 3 3  Sitting and talking to someone 0 0  Sitting quietly after a lunch without alcohol 0 0  In a car, while stopped for a few minutes in traffic 0 0  Total score 5 6     PAST MEDICAL HISTORY :   has a past medical history of Allergy, Arthritis, Asthma, Diverticulitis (09/2016), Genital warts, History of chicken pox, History of hay fever, Hyperlipidemia (09/27/2016), and Hypertension.  has a past surgical history that includes Carpal tunnel release (Bilateral, 2006-2007). Prior to Admission medications   Medication Sig Start Date End Date Taking? Authorizing Provider  aspirin 81 MG tablet Take 81 mg by mouth daily.   Yes [provider]  carvedilol (COREG) 12.5 MG tablet Take 1 tablet (12.5 mg total) by mouth 2 (two) times daily with a meal. 01/06/24  Yes Wendling, Jilda Roche, DO  loratadine (CLARITIN) 10 MG tablet Take 10 mg by mouth daily.   Yes [provider]  losartan-hydrochlorothiazide (HYZAAR) 100-25 MG tablet TAKE 1 TABLET EVERY DAY 05/30/23  Yes Sharlene Dory, DO  meloxicam (MOBIC) 15 MG tablet TAKE 1 TABLET EVERY DAY 12/19/23  Yes Sharlene Dory, DO  Multiple Vitamins-Minerals (MULTIVITAMIN MEN PO) Take by mouth.   Yes [provider]  potassium chloride (KLOR-CON) 10 MEQ tablet Take 1 tablet by mouth once daily 06/09/23  Yes Wendling, Jilda Roche, DO  rosuvastatin (CRESTOR) 20 MG tablet Take 1 tablet by mouth once daily 06/09/23  Yes Wendling, Jilda Roche, DO  tamsulosin (FLOMAX) 0.4 MG CAPS capsule TAKE 1 CAPSULE EVERY DAY 05/30/23  Yes Sharlene Dory, DO  traZODone (DESYREL) 50 MG tablet Take 1 tablet (50 mg total) by mouth at bedtime. 06/09/23  Yes Sharlene Dory, DO   Allergies  Allergen Reactions   Penicillins Swelling    Swelling of lips Has patient had a PCN reaction causing immediate rash, facial/tongue/throat swelling, SOB or lightheadedness with hypotension: Yes Has patient had a PCN reaction causing severe rash involving mucus membranes or skin necrosis: Yes Has patient had a PCN reaction that required hospitalization: No Has patient had a PCN  reaction occurring within the last 10 years: No If all of the above answers are "NO", then may proceed with Cephalosporin use.     FAMILY HISTORY:  family history includes Arthritis in his father and mother; Breast cancer in his mother; Cancer in his father, maternal uncle, mother, paternal aunt, and paternal uncle; Diabetes in his mother; Heart disease in his father, mother, and paternal grandmother; Hyperlipidemia in his father and mother; Hypertension in his mother; Stroke in his  father. SOCIAL HISTORY:  reports that he has never smoked. He has never been exposed to tobacco smoke. He has never used smokeless tobacco. He reports that he does not drink alcohol and does not use drugs.   Review of Systems:  Gen:  Denies  fever, sweats, chills weight loss  HEENT: Denies blurred vision, double vision, ear pain, eye pain, hearing loss, nose bleeds, sore throat Cardiac:  No dizziness, chest pain or heaviness, chest tightness,edema, No JVD Resp:   No cough, -sputum production, -shortness of breath,-wheezing, -hemoptysis,  Gi: Denies swallowing difficulty, stomach pain, nausea or vomiting, diarrhea, constipation, bowel incontinence Gu:  Denies bladder incontinence, burning urine Ext:   Denies Joint pain, stiffness or swelling Skin: Denies  skin rash, easy bruising or bleeding or hives Endoc:  Denies polyuria, polydipsia , polyphagia or weight change Psych:   Denies depression, insomnia or hallucinations  Other:  All other systems negative  VITAL SIGNS: BP 122/68 (BP Location: Right Arm, Cuff Size: Large)   Pulse 61   Temp (!) 96.9 F (36.1 C)   Ht 5\' 8"  (1.727 m)   Wt 223 lb 6.4 oz (101.3 kg)   SpO2 98%   BMI 33.97 kg/m    Physical Examination:   General Appearance: No distress  EYES PERRLA, EOM intact.   NECK Supple, No JVD Pulmonary: normal breath sounds, No wheezing.  CardiovascularNormal S1,S2.  No m/r/g.   Abdomen: Benign, Soft, non-tender. Skin:   warm, no rashes, no ecchymosis  Extremities: normal, no cyanosis, clubbing. Neuro:without focal findings,  speech normal  PSYCHIATRIC: Mood, affect within normal limits.   ASSESSMENT AND PLAN  OSA I suspect that OSA is likely present due to clinical presentation. Discussed the consequences of untreated sleep apnea. Advised not to drive drowsy for safety of patient and others. Will complete further evaluation with a home sleep study and follow up to review results.    HTN Stable, on current management.  Following with PCP.   Obesity Counseled patient on diet and lifestyle modification.    MEDICATION ADJUSTMENTS/LABS AND TESTS ORDERED: Recommend Sleep Study   Patient  satisfied with Plan of action and management. All questions answered  Follow up to review HST results and treatment plan.   I spent a total of  32 minutes reviewing chart data, face-to-face evaluation with the patient, counseling and coordination of care as detailed above.    Tempie Hoist, M.D.  Sleep Medicine [facility]

## 2024-01-20 NOTE — Patient Instructions (Signed)
 Marland Kitchen

## 2024-01-26 ENCOUNTER — Ambulatory Visit (INDEPENDENT_AMBULATORY_CARE_PROVIDER_SITE_OTHER): Admitting: Family Medicine

## 2024-01-26 ENCOUNTER — Encounter: Payer: Self-pay | Admitting: Family Medicine

## 2024-01-26 VITALS — BP 128/78 | HR 54 | Temp 98.0°F | Resp 20 | Ht 68.0 in | Wt 219.0 lb

## 2024-01-26 DIAGNOSIS — I10 Essential (primary) hypertension: Secondary | ICD-10-CM | POA: Diagnosis not present

## 2024-01-26 DIAGNOSIS — L308 Other specified dermatitis: Secondary | ICD-10-CM

## 2024-01-26 MED ORDER — TRIAMCINOLONE ACETONIDE 0.1 % EX CREA: 1.0000 | TOPICAL_CREAM | Freq: Two times a day (BID) | CUTANEOUS | 0 refills | Status: AC

## 2024-01-26 MED ORDER — CARVEDILOL 12.5 MG PO TABS: 12.5000 mg | ORAL_TABLET | Freq: Two times a day (BID) | ORAL | 3 refills | Status: DC

## 2024-01-26 NOTE — Patient Instructions (Signed)
 Try not to scratch as this can make things worse. Avoid scented products while dealing with this. You may resume when the itchiness resolves. Cold/cool compresses can help.   Stay on the Claritin.   Let us know if you need anything.

## 2024-01-26 NOTE — Progress Notes (Signed)
 Chief Complaint  Patient presents with   Acute Visit    Patient presents today for possible rash to lower right leg for 2-3 weeks now. He reports itchy and red.    Sean Bowers is a 68 y.o. male here for a skin complaint.  Duration: 2 weeks Location: lower legs Pruritic? Yes Painful? No Drainage? No New soaps/lotions/topicals/detergents? No Sick contacts? No Other associated symptoms: not spreading Therapies tried thus far: none  Hypertension Patient presents for hypertension follow up. He does not monitor home blood pressures. He is compliant with medications- Hyzaar 100-25 mg/d, Coreg 12.5 mg bid. Patient has these side effects of medication: none He is usually adhering to a healthy diet overall. Exercise: active at work, walking No CP or SOB.  Past Medical History:  Diagnosis Date   Allergy    Arthritis    Asthma    no inhalers   Diverticulitis 09/2016   seen in Urgent Care   Genital warts    History of chicken pox    History of hay fever    Hyperlipidemia 09/27/2016   Hypertension     BP 128/78   Pulse (!) 54   Temp 98 F (36.7 C)   Resp 20   Ht 5\' 8"  (1.727 m)   Wt 219 lb (99.3 kg)   SpO2 98%   BMI 33.30 kg/m  Gen: awake, alert, appearing stated age Lungs: No accessory muscle use Skin: red-pink patches with some excoriation, scaling over anterior leges. No drainage, TTP, fluctuance Psych: Age appropriate judgment and insight  Pruritic dermatitis - Plan: triamcinolone cream (KENALOG) 0.1 %  Essential hypertension - Plan: carvedilol (COREG) 12.5 MG tablet  Try not to scratch as this can make things worse. Avoid scented products while dealing with this. Prn non-scented emollient. Cold/cool compresses can help. Cont Claritin. Bid top CS cream.  Chronic, stable. Cont Hyzaar 100-25 mg/d, Coreg 12.5 mg bid. Counseled on diet/exercise. F/u in 6 mo or prn.  The patient voiced understanding and agreement to the plan.  Sean Roche [place],  DO 01/26/24 8:28 AM

## 2024-01-30 DIAGNOSIS — H35372 Puckering of macula, left eye: Secondary | ICD-10-CM | POA: Diagnosis not present

## 2024-02-01 DIAGNOSIS — G4730 Sleep apnea, unspecified: Secondary | ICD-10-CM | POA: Diagnosis not present

## 2024-02-10 ENCOUNTER — Encounter

## 2024-02-10 DIAGNOSIS — G4733 Obstructive sleep apnea (adult) (pediatric): Secondary | ICD-10-CM

## 2024-02-10 DIAGNOSIS — R0683 Snoring: Secondary | ICD-10-CM | POA: Diagnosis not present

## 2024-02-12 ENCOUNTER — Ambulatory Visit: Payer: Self-pay | Admitting: *Deleted

## 2024-02-12 ENCOUNTER — Telehealth: Payer: Self-pay

## 2024-02-12 DIAGNOSIS — G4733 Obstructive sleep apnea (adult) (pediatric): Secondary | ICD-10-CM

## 2024-02-12 NOTE — Telephone Encounter (Signed)
  Chief Complaint: right knee pain and swelling  Symptoms: right knee pain level at rest 3-4 with mobility 5-6 . Swelling reported no redness, can walk and bend knee Frequency: couple of days ago Pertinent Negatives: Patient denies fever or redness Disposition: [] ED /[] Urgent Care (no appt availability in office) / [x] Appointment(In office/virtual)/ []  [facility]/ [] Home Care/ [] Refused Recommended Disposition /[] [facility]/ []  Follow-up with PCP Additional Notes:    No available appt  with PCP. Scheduled appt with other provider tomorrow.      Copied from CRM [id number]. Topic: Clinical - Red Word Triage >> Feb 12, 2024  1:49 PM Gurney Maxin H wrote: [facility] that prompted transfer to Nurse Triage: Right knee pain, hurts to walk and a little swollen. Reason for Disposition  MILD or MODERATE swelling (e.g., can't move joint normally, can't do usual activities) (Exceptions: Itchy, localized swelling; swelling is chronic.)  Answer Assessment - Initial Assessment Questions 1. LOCATION: "Where is the swelling located?"  (e.g., left, right, both knees)     Right knee 2. ONSET: "When did the swelling start?" "Does it come and go, or is it there all the time?"     Couple days  3. SWELLING: "How bad is the swelling?" Or, "How large is it?" (e.g., mild, moderate, severe; size of localized swelling)    - NONE: No joint swelling.   - LOCALIZED: Localized; small area of puffy or swollen skin (e.g., insect bite, skin irritation).   - MILD: Joint looks or feels mildly swollen or puffy.   - MODERATE: Swollen; interferes with normal activities (e.g., work or school); can't move joint normally (bend and straighten completely); may be limping.   - SEVERE: Very swollen; can't move swollen joint at all; limping a lot or unable to walk.    Swelling can walk 4. PAIN: "Is there any pain?" If Yes, ask: "How bad is it?" (Scale 1-10; or mild, moderate, severe)   - NONE (0): no pain.   - MILD  (1-3): doesn't interfere with normal activities.    - MODERATE (4-7): interferes with normal activities (e.g., work or school) or awakens from sleep, limping.    - SEVERE (8-10): excruciating pain, unable to do any normal activities, unable to walk.      Pain level 3-4 at rest , 5-6 with mobility 5. SETTING: "Has there been any recent work, exercise or other activity that involved that part of the body?"      No  6. AGGRAVATING FACTORS: "What makes the knee swelling worse?" (e.g., walking, climbing stairs, running)     na 7. ASSOCIATED SYMPTOMS: "Is there any pain or redness?"     Yes pain no redness reported  8. OTHER SYMPTOMS: "Do you have any other symptoms?" (e.g., chest pain, difficulty breathing, fever, calf pain)     Right knee pain  9. PREGNANCY: "Is there any chance you are pregnant?" "When was your last menstrual period?"     na  Protocols used: Knee Swelling-A-AH

## 2024-02-12 NOTE — Telephone Encounter (Signed)
-----   Message from [facility] D REDDY sent at 02/12/2024 10:10 AM EDT ----- Regarding: HST results Please notify patient that HST revealed moderate OSA, recommend starting on APAP therapy set to 4-16 cm H2O, EPR 3 with the AirTouch N30i nasal mask. Please also schedule a 3 month CPAP follow up visit. Thanks ----- Message ----- From: Lilian Kapur Sent: 02/11/2024   5:30 PM EDT To: Alanda Slim, MD

## 2024-02-12 NOTE — Telephone Encounter (Signed)
 I have notified the patient and placed the order for the avap machine. The patient will call back once he receives the machine to schedule a 31-90 day follow up appointment.  Nothing further needed.

## 2024-02-13 ENCOUNTER — Encounter: Payer: Self-pay | Admitting: Physician Assistant

## 2024-02-13 ENCOUNTER — Ambulatory Visit (INDEPENDENT_AMBULATORY_CARE_PROVIDER_SITE_OTHER): Admitting: Physician Assistant

## 2024-02-13 VITALS — BP 134/68 | HR 57 | Ht 68.0 in | Wt 214.8 lb

## 2024-02-13 DIAGNOSIS — M25561 Pain in right knee: Secondary | ICD-10-CM

## 2024-02-13 MED ORDER — CYCLOBENZAPRINE HCL 10 MG PO TABS: 10.0000 mg | ORAL_TABLET | Freq: Three times a day (TID) | ORAL | 0 refills | Status: DC | PRN

## 2024-02-13 NOTE — Progress Notes (Signed)
      Established patient visit   Patient: Sean Bowers   DOB: 05-31-1956   [name]  MRN: [id number] Visit Date: 02/13/2024  Today's healthcare provider: Alfredia Ferguson, PA-C   Cc. Right knee pain  Subjective     Pt reports he stepped wrong off his truck yesterday, right knee pain and swelling. He has iced and taken ibuprofen and a muscle relaxant. He is able to walk, put all his weight on that leg, put his socks on standing.  Medications: Outpatient Medications Prior to Visit  Medication Sig   aspirin 81 MG tablet Take 81 mg by mouth daily.   carvedilol (COREG) 12.5 MG tablet Take 1 tablet (12.5 mg total) by mouth 2 (two) times daily with a meal.   loratadine (CLARITIN) 10 MG tablet Take 10 mg by mouth daily.   losartan-hydrochlorothiazide (HYZAAR) 100-25 MG tablet TAKE 1 TABLET EVERY DAY   meloxicam (MOBIC) 15 MG tablet TAKE 1 TABLET EVERY DAY   Multiple Vitamins-Minerals (MULTIVITAMIN MEN PO) Take by mouth.   potassium chloride (KLOR-CON) 10 MEQ tablet Take 1 tablet by mouth once daily   rosuvastatin (CRESTOR) 20 MG tablet Take 1 tablet by mouth once daily   tamsulosin (FLOMAX) 0.4 MG CAPS capsule TAKE 1 CAPSULE EVERY DAY   traZODone (DESYREL) 50 MG tablet Take 1 tablet (50 mg total) by mouth at bedtime.   triamcinolone cream (KENALOG) 0.1 % Apply 1 Application topically 2 (two) times daily.   No facility-administered medications prior to visit.    Review of Systems  Constitutional:  Negative for fatigue and fever.  Respiratory:  Negative for cough and shortness of breath.   Cardiovascular:  Negative for chest pain, palpitations and leg swelling.  Neurological:  Negative for dizziness and headaches.       Objective    BP 134/68   Pulse (!) 57   Ht 5\' 8"  (1.727 m)   Wt 214 lb 12.8 oz (97.4 kg)   BMI 32.66 kg/m    Physical Exam Vitals reviewed.  Constitutional:      Appearance: He is not ill-appearing.  HENT:     Head: Normocephalic.  Eyes:      Conjunctiva/sclera: Conjunctivae normal.  Cardiovascular:     Rate and Rhythm: Normal rate.  Pulmonary:     Effort: Pulmonary effort is normal. No respiratory distress.  Musculoskeletal:     Comments: Small lateral joint effusion, minimal lateral tenderness. On exam no joint laxity, full ROM without pain, full strength 5/5.    Neurological:     Mental Status: He is alert and oriented to person, place, and time.  Psychiatric:        Mood and Affect: Mood normal.        Behavior: Behavior normal.    No results found for any visits on 02/13/24.  Assessment & Plan    Acute pain of right knee -     Cyclobenzaprine HCl; Take 1 tablet (10 mg total) by mouth 3 (three) times daily as needed for muscle spasms.  Dispense: 20 tablet; Refill: 0  Recommending light activity, no kneeling, squatting. Ice, elevation. Ok to take flexeril, ibuprofen, tylenol.  Return if symptoms worsen or fail to improve.       Alfredia Ferguson, PA-C  [facility] [phone] (phone) [phone] (fax)  [facility]

## 2024-02-17 ENCOUNTER — Ambulatory Visit: Admitting: Sleep Medicine

## 2024-03-04 ENCOUNTER — Encounter: Payer: Self-pay | Admitting: Family Medicine

## 2024-03-04 ENCOUNTER — Ambulatory Visit (INDEPENDENT_AMBULATORY_CARE_PROVIDER_SITE_OTHER): Admitting: Family Medicine

## 2024-03-04 VITALS — BP 134/82 | HR 65 | Temp 98.2°F | Ht 68.0 in | Wt 213.8 lb

## 2024-03-04 DIAGNOSIS — J40 Bronchitis, not specified as acute or chronic: Secondary | ICD-10-CM

## 2024-03-04 DIAGNOSIS — R059 Cough, unspecified: Secondary | ICD-10-CM | POA: Diagnosis not present

## 2024-03-04 LAB — POCT INFLUENZA A/B
Influenza A, POC: NEGATIVE
Influenza B, POC: NEGATIVE

## 2024-03-04 LAB — POC COVID19 BINAXNOW: SARS Coronavirus 2 Ag: NEGATIVE

## 2024-03-04 MED ORDER — PREDNISONE 20 MG PO TABS: 40.0000 mg | ORAL_TABLET | Freq: Every day | ORAL | 0 refills | Status: AC

## 2024-03-04 MED ORDER — AZITHROMYCIN 250 MG PO TABS: ORAL_TABLET | ORAL | 0 refills | Status: DC

## 2024-03-04 NOTE — Patient Instructions (Addendum)
 Continue to push fluids, practice good hand hygiene, and cover your mouth if you cough.  If you start having fevers, shaking or shortness of breath, seek immediate care.  OK to take Tylenol 1000 mg (2 extra strength tabs) or 975 mg (3 regular strength tabs) every 6 hours as needed.  Start with the prednisone. If not significantly better in 2 days, take the antibiotic.   Let us  know if you need anything.

## 2024-03-04 NOTE — Progress Notes (Signed)
 CC: URI  Allan Arab here for URI complaints.  Duration: 3 days - worsening Associated symptoms: sinus congestion, rhinorrhea, and coughing Denies: sinus pain, itchy watery eyes, ear pain, ear drainage, sore throat, wheezing, shortness of breath, myalgia, and fevers Treatment to date: Nyquil Sick contacts: No  Past Medical History:  Diagnosis Date   Allergy    Arthritis    Asthma    no inhalers   Diverticulitis 09/2016   seen in Urgent Care   Genital warts    History of chicken pox    History of hay fever    Hyperlipidemia 09/27/2016   Hypertension     Objective BP 134/82   Pulse 65   Temp 98.2 F (36.8 C)   Ht 5\' 8"  (1.727 m)   Wt 213 lb 12.8 oz (97 kg)   SpO2 96%   BMI 32.51 kg/m  General: Awake, alert, appears stated age HEENT: AT, [place], ears patent b/l and TM's neg, nares patent w/o discharge, pharynx pink and without exudates, MMM, no sinus ttp Neck: No masses or asymmetry Heart: RRR Lungs: faint exp wheezes at bases, no accessory muscle use Psych: Age appropriate judgment and insight, normal mood and affect  Wheezy bronchitis - Plan: predniSONE (DELTASONE) 20 MG tablet, azithromycin (ZITHROMAX) 250 MG tablet  5 d pred burst 40 mg/d. If not significantly better in 2 days, take the Zpak. Continue to push fluids, practice good hand hygiene, cover mouth when coughing. F/u prn. If starting to experience fevers, shaking, or shortness of breath, seek immediate care. Pt voiced understanding and agreement to the plan.  Shellie Dials [place], DO 03/04/24 3:00 PM

## 2024-03-17 ENCOUNTER — Other Ambulatory Visit: Payer: Self-pay | Admitting: Family Medicine

## 2024-03-28 ENCOUNTER — Other Ambulatory Visit: Payer: Self-pay

## 2024-03-28 ENCOUNTER — Emergency Department
Admission: EM | Admit: 2024-03-28 | Discharge: 2024-03-28 | Disposition: A | Discharge status: Home / Self Care | Attending: Emergency Medicine | Admitting: Emergency Medicine

## 2024-03-28 DIAGNOSIS — Z8616 Personal history of COVID-19: Secondary | ICD-10-CM | POA: Insufficient documentation

## 2024-03-28 DIAGNOSIS — I10 Essential (primary) hypertension: Secondary | ICD-10-CM | POA: Insufficient documentation

## 2024-03-28 DIAGNOSIS — W57XXXA Bitten or stung by nonvenomous insect and other nonvenomous arthropods, initial encounter: Secondary | ICD-10-CM | POA: Insufficient documentation

## 2024-03-28 DIAGNOSIS — S40861A Insect bite (nonvenomous) of right upper arm, initial encounter: Secondary | ICD-10-CM | POA: Diagnosis not present

## 2024-03-28 DIAGNOSIS — Z8546 Personal history of malignant neoplasm of prostate: Secondary | ICD-10-CM | POA: Diagnosis not present

## 2024-03-28 MED ORDER — DOXYCYCLINE HYCLATE 100 MG PO TABS: 100.0000 mg | ORAL_TABLET | Freq: Two times a day (BID) | ORAL | 0 refills | Status: AC

## 2024-03-28 NOTE — ED Triage Notes (Addendum)
 Pt to ED via pov C/O tick to right upper arm. Has tried to take it out himself with no success. Wants it taken out, pt in NAD at this time

## 2024-03-28 NOTE — ED Provider Notes (Signed)
 [facility] Provider Note    Event Date/Time   First MD Initiated Contact with Patient 03/28/24 2247     (approximate)   History   Tick Removal    HPI  Sean Bowers is a 69 y.o. male    with a past medical history of OSA, pruritic dermatitis, bronchitis, BPH, hypertension COVID, hypokalemia malignant neoplasm of prostate,  who presents to the ED complaining of tick in his right arm.  . According to the patient, he noticed a tick 2 hours ago.       Physical Exam   Triage Vital Signs: ED Triage Vitals  Encounter Vitals Group     BP 03/28/24 2206 (!) 163/83     Systolic BP Percentile --      Diastolic BP Percentile --      Pulse Rate 03/28/24 2206 (!) 57     Resp 03/28/24 2206 17     Temp 03/28/24 2206 98 F (36.7 C)     Temp Source 03/28/24 2206 Oral     SpO2 03/28/24 2206 98 %     Weight --      Height --      Head Circumference --      Peak Flow --      Pain Score 03/28/24 2205 0     Pain Loc --      Pain Education --      Exclude from Growth Chart --     Most recent vital signs: Vitals:   03/28/24 2206  BP: (!) 163/83  Pulse: (!) 57  Resp: 17  Temp: 98 F (36.7 C)  SpO2: 98%     Constitutional: Alert, NAD. Able to speak in complete sentences without cough or dyspnea  Eyes: Conjunctivae are normal.  Head: Atraumatic. Nose: No congestion/rhinnorhea. Mouth/Throat: Mucous membranes are moist.   Neck: Painless ROM. Supple. No JVD, nodes, thyromegaly  Cardiovascular:   Good peripheral circulation.RRR no murmurs, gallops, rubs  Respiratory: Normal respiratory effort.  No retractions. Clear to auscultation bilaterally without wheezing or crackles  Gastrointestinal: Soft and nontender.  Musculoskeletal:  no deformity Right arm: Presence of thick medial area close to axilla.  Neurologic:  MAE spontaneously. No gross focal neurologic deficits are appreciated.  Skin:  Skin is warm, dry and intact. No rash noted. Psychiatric: Mood  and affect are normal. Speech and behavior are normal.    ED Results / Procedures / Treatments   Labs (all labs ordered are listed, but only abnormal results are displayed) Labs Reviewed - No data to display   EKG     RADIOLOGY I  PROCEDURES:  Critical Care performed:   .Incision and Drainage  Date/Time: 03/28/2024 11:19 PM  Performed by: Awilda Lennox, PA-C Authorized by: Awilda Lennox, PA-C   Consent:    Consent obtained:  Verbal   Consent given by:  Patient   Risks discussed:  Infection Universal protocol:    Procedure explained and questions answered to patient or proxy's satisfaction: yes     Patient identity confirmed:  Verbally with patient Location:    Indications for incision and drainage: Tick.   Location:  Upper extremity   Upper extremity location:  Arm   Arm location:  R upper arm Sedation:    Sedation type:  None Anesthesia:    Anesthesia method:  None Procedure type:    Complexity:  Simple Post-procedure details:    Procedure completion:  Tolerated well, no immediate complications    MEDICATIONS ORDERED IN  ED: Medications - No data to display    IMPRESSION / MDM / ASSESSMENT AND PLAN / ED COURSE  I reviewed the triage vital signs and the nursing notes.  Differential diagnosis includes, but is not limited to, fatigue, Lyme disease, cellulitis  Patient's presentation is most consistent with acute, uncomplicated illness.   Patient's diagnosis is consistent with tick in the arm.  I did review the patient's allergies and medications.I explained to the patient the signs of Lyme disease patient desires to take doxycycline .  the patient is in stable and satisfactory condition for discharge home  Patient will be discharged home with prescriptions for doxycycline . Patient is to follow up with PCP as needed or otherwise directed. Patient is given ED precautions to return to the ED for any worsening or new symptoms. Discussed plan of care with  patient, answered all of patient's questions, Patient agreeable to plan of care. Advised patient to take medications according to the instructions on the label. Discussed possible side effects of new medications. Patient verbalized understanding.    FINAL CLINICAL IMPRESSION(S) / ED DIAGNOSES   Final diagnoses:  Tick bite of axillary region, right, initial encounter     Rx / DC Orders   ED Discharge Orders          Ordered    doxycycline  (VIBRA -TABS) 100 MG tablet  2 times daily        03/28/24 2322             Note:  This document was prepared using Dragon voice recognition software and may include unintentional dictation errors.   Awilda Lennox, PA-C 03/28/24 2323    Kandee Orion, MD 03/28/24 [phone]

## 2024-03-28 NOTE — Discharge Instructions (Addendum)
 You have been diagnosed with tick bite.  Please take doxycycline  1 tablet by mouth every 12  hours after main meals for 10 days.  Please come back to ED or go to your PCP if you have new symptoms or symptoms worsen.

## 2024-03-30 ENCOUNTER — Other Ambulatory Visit: Payer: Self-pay | Admitting: Family Medicine

## 2024-03-30 DIAGNOSIS — I10 Essential (primary) hypertension: Secondary | ICD-10-CM

## 2024-04-19 ENCOUNTER — Ambulatory Visit: Admitting: Sleep Medicine

## 2024-04-19 ENCOUNTER — Encounter: Payer: Self-pay | Admitting: Sleep Medicine

## 2024-04-19 VITALS — BP 130/80 | HR 58 | Temp 98.2°F | Ht 69.0 in | Wt 220.6 lb

## 2024-04-19 DIAGNOSIS — I10 Essential (primary) hypertension: Secondary | ICD-10-CM

## 2024-04-19 DIAGNOSIS — Z6833 Body mass index (BMI) 33.0-33.9, adult: Secondary | ICD-10-CM | POA: Diagnosis not present

## 2024-04-19 DIAGNOSIS — Z9981 Dependence on supplemental oxygen: Secondary | ICD-10-CM | POA: Diagnosis not present

## 2024-04-19 DIAGNOSIS — G4733 Obstructive sleep apnea (adult) (pediatric): Secondary | ICD-10-CM | POA: Diagnosis not present

## 2024-04-19 DIAGNOSIS — E66811 Obesity, class 1: Secondary | ICD-10-CM | POA: Diagnosis not present

## 2024-04-19 NOTE — Progress Notes (Signed)
 Name:Sean Bowers MRN: [id number] DOB: 24-Apr-1956   CHIEF COMPLAINT:  CPAP F/U   HISTORY OF PRESENT ILLNESS:  Sean Bowers is a 68 y.o. w/ a h/o OSA, HTN and obesity who presents for CPAP f/u visit. Reports using CPAP therapy every night, which is confirmed by compliance data. He is currently using the Airfit F20 FFM, which is comfortable. Reports feeling a little more refreshed upon awakening with CPAP therapy.     EPWORTH SLEEP SCORE    01/20/2024    8:00 AM 03/23/2020    3:00 PM  Results of the Epworth flowsheet  Sitting and reading 0 1  Watching TV 1 1  Sitting, inactive in a public place (e.g. a theatre or a meeting) 1 0  As a passenger in a car for an hour without a break 0 1  Lying down to rest in the afternoon when circumstances permit 3 3  Sitting and talking to someone 0 0  Sitting quietly after a lunch without alcohol 0 0  In a car, while stopped for a few minutes in traffic 0 0  Total score 5 6    PAST MEDICAL HISTORY :   has a past medical history of Allergy, Arthritis, Asthma, Diverticulitis (09/2016), Genital warts, History of chicken pox, History of hay fever, Hyperlipidemia (09/27/2016), and Hypertension.  has a past surgical history that includes Carpal tunnel release (Bilateral, 2006-2007). Prior to Admission medications   Medication Sig Start Date End Date Taking? Authorizing Provider  amLODipine  (NORVASC ) 5 MG tablet Take 5 mg by mouth daily. 02/19/24  Yes [provider]  ascorbic acid (VITAMIN C) 500 MG tablet Take 500 mg by mouth daily.   Yes [provider]  aspirin  81 MG tablet Take 81 mg by mouth daily.   Yes [provider]  azithromycin  (ZITHROMAX ) 250 MG tablet Take 2 tabs the first day and then 1 tab daily until you run out. 03/04/24  Yes Jobe Mulder, DO  carvedilol  (COREG ) 12.5 MG tablet Take 1 tablet (12.5 mg total) by mouth 2 (two) times daily with a meal. 01/26/24  Yes Wendling, Shellie Dials, DO   Cholecalciferol (VITAMIN D -1000 MAX ST) 25 MCG (1000 UT) tablet Take 1,000 Units by mouth daily.   Yes [provider]  cyclobenzaprine  (FLEXERIL ) 10 MG tablet Take 1 tablet (10 mg total) by mouth 3 (three) times daily as needed for muscle spasms. 02/13/24  Yes Trenton Frock, PA-C  loratadine (CLARITIN) 10 MG tablet Take 10 mg by mouth daily.   Yes [provider]  losartan -hydrochlorothiazide  (HYZAAR) 100-25 MG tablet TAKE 1 TABLET EVERY DAY 05/30/23  Yes Jobe Mulder, DO  meloxicam  (MOBIC ) 15 MG tablet TAKE 1 TABLET EVERY DAY 12/19/23  Yes Jobe Mulder, DO  Multiple Vitamins-Minerals (MULTIVITAMIN MEN PO) Take by mouth.   Yes [provider]  potassium chloride  (KLOR-CON ) 10 MEQ tablet TAKE 1 TABLET EVERY DAY 03/17/24  Yes Jobe Mulder, DO  rosuvastatin  (CRESTOR ) 20 MG tablet Take 1 tablet by mouth once daily 06/09/23  Yes Wendling, Shellie Dials, DO  tamsulosin  (FLOMAX ) 0.4 MG CAPS capsule TAKE 1 CAPSULE EVERY DAY 05/30/23  Yes Jobe Mulder, DO  traZODone  (DESYREL ) 50 MG tablet Take 1 tablet (50 mg total) by mouth at bedtime. 06/09/23  Yes Jobe Mulder, DO  triamcinolone  cream (KENALOG ) 0.1 % Apply 1 Application topically 2 (two) times daily. 01/26/24  Yes Jobe Mulder, DO   Allergies  Allergen Reactions  Penicillins Swelling    Swelling of lips Has patient had a PCN reaction causing immediate rash, facial/tongue/throat swelling, SOB or lightheadedness with hypotension: Yes Has patient had a PCN reaction causing severe rash involving mucus membranes or skin necrosis: Yes Has patient had a PCN reaction that required hospitalization: No Has patient had a PCN reaction occurring within the last 10 years: No If all of the above answers are "NO", then may proceed with Cephalosporin use.     FAMILY HISTORY:  family history includes Arthritis in his father and mother; Breast cancer in his mother; Cancer in  his father, maternal uncle, mother, paternal aunt, and paternal uncle; Diabetes in his mother; Heart disease in his father, mother, and paternal grandmother; Hyperlipidemia in his father and mother; Hypertension in his mother; Stroke in his father. SOCIAL HISTORY:  reports that he has never smoked. He has never been exposed to tobacco smoke. He has never used smokeless tobacco. He reports that he does not drink alcohol and does not use drugs.   Review of Systems:  Gen:  Denies  fever, sweats, chills weight loss  HEENT: Denies blurred vision, double vision, ear pain, eye pain, hearing loss, nose bleeds, sore throat Cardiac:  No dizziness, chest pain or heaviness, chest tightness,edema, No JVD Resp:   No cough, -sputum production, -shortness of breath,-wheezing, -hemoptysis,  Gi: Denies swallowing difficulty, stomach pain, nausea or vomiting, diarrhea, constipation, bowel incontinence Gu:  Denies bladder incontinence, burning urine Ext:   Denies Joint pain, stiffness or swelling Skin: Denies  skin rash, easy bruising or bleeding or hives Endoc:  Denies polyuria, polydipsia , polyphagia or weight change Psych:   Denies depression, insomnia or hallucinations  Other:  All other systems negative  VITAL SIGNS: BP 130/80 (BP Location: Left Arm, Patient Position: Sitting, Cuff Size: Large)   Pulse (!) 58   Temp 98.2 F (36.8 C) (Oral)   Ht 5\' 9"  (1.753 m)   Wt 220 lb 9.6 oz (100.1 kg)   SpO2 97%   BMI 32.58 kg/m    Physical Examination:   General Appearance: No distress  EYES PERRLA, EOM intact.   NECK Supple, No JVD Pulmonary: normal breath sounds, No wheezing.  CardiovascularNormal S1,S2.  No m/r/g.   Abdomen: Benign, Soft, non-tender. Skin:   warm, no rashes, no ecchymosis  Extremities: normal, no cyanosis, clubbing. Neuro:without focal findings,  speech normal  PSYCHIATRIC: Mood, affect within normal limits.   ASSESSMENT AND PLAN  OSA I suspect that OSA is likely present  due to clinical presentation. Discussed the consequences of untreated sleep apnea. Advised not to drive drowsy for safety of patient and others. Will complete further evaluation with a home sleep study and follow up to review results.    HTN Stable, on current management. Following with PCP.   Obesity Counseled patient on diet and lifestyle modification.   Patient  satisfied with Plan of action and management. All questions answered  I spent a total of 32 minutes reviewing chart data, face-to-face evaluation with the patient, counseling and coordination of care as detailed above.    Antwione Picotte, M.D.  Sleep Medicine [facility]

## 2024-04-19 NOTE — Patient Instructions (Signed)

## 2024-05-05 ENCOUNTER — Other Ambulatory Visit: Payer: Self-pay | Admitting: Family Medicine

## 2024-05-05 DIAGNOSIS — T464X5A Adverse effect of angiotensin-converting-enzyme inhibitors, initial encounter: Secondary | ICD-10-CM

## 2024-05-11 ENCOUNTER — Ambulatory Visit

## 2024-05-27 DIAGNOSIS — R3912 Poor urinary stream: Secondary | ICD-10-CM | POA: Diagnosis not present

## 2024-05-27 DIAGNOSIS — N5201 Erectile dysfunction due to arterial insufficiency: Secondary | ICD-10-CM | POA: Diagnosis not present

## 2024-05-27 DIAGNOSIS — C61 Malignant neoplasm of prostate: Secondary | ICD-10-CM | POA: Diagnosis not present

## 2024-05-29 ENCOUNTER — Other Ambulatory Visit: Payer: Self-pay | Admitting: Family Medicine

## 2024-05-29 DIAGNOSIS — F5104 Psychophysiologic insomnia: Secondary | ICD-10-CM

## 2024-05-31 ENCOUNTER — Ambulatory Visit (INDEPENDENT_AMBULATORY_CARE_PROVIDER_SITE_OTHER): Admitting: Family Medicine

## 2024-05-31 VITALS — BP 130/72 | HR 64 | Temp 98.0°F | Resp 16 | Ht 69.0 in | Wt 218.6 lb

## 2024-05-31 DIAGNOSIS — L989 Disorder of the skin and subcutaneous tissue, unspecified: Secondary | ICD-10-CM | POA: Diagnosis not present

## 2024-05-31 NOTE — Patient Instructions (Addendum)
 Send me a message if new symptoms arise.   For the next 7-10 days, please use triple antibiotic ointment (Neosporin) twice daily.   I think there is a component of bruising with this and this can take around a month to resolve.   Let us  know if you need anything.

## 2024-05-31 NOTE — Progress Notes (Signed)
 Chief Complaint  Patient presents with   Insect Bite    Insect Bite    Sean Bowers is a 68 y.o. male here for a skin complaint.  Duration: 4 days Location: legs Pruritic? One lesion did initially Painful? No Drainage? No New soaps/lotions/topicals/detergents? No Sick contacts? No Other associated symptoms: no obvious trauma or fevers Therapies tried thus far: none  Past Medical History:  Diagnosis Date   Allergy    Arthritis    Asthma    no inhalers   Diverticulitis 09/2016   seen in Urgent Care   Genital warts    History of chicken pox    History of hay fever    Hyperlipidemia 09/27/2016   Hypertension     BP 130/72 (BP Location: Left Arm, Patient Position: Sitting)   Pulse 64   Temp 98 F (36.7 C) (Oral)   Resp 16   Ht 5' 9 (1.753 m)   Wt 218 lb 9.6 oz (99.2 kg)   SpO2 97%   BMI 32.28 kg/m  Gen: awake, alert, appearing stated age Lungs: No accessory muscle use Skin: patches and macular lesions with central excoriation and some centered around a follicle. Some blanch, some do not.  No scaling, ttp, fluctuance, drainage.  Psych: Age appropriate judgment and insight  Skin lesions  Some lesions look like bites and some look like furuncles. TAO bid for 7-10 d. Ecchymosis from bites could take 4-5 weeks to resolve. Let us  know if there are issues.  F/u prn. The patient voiced understanding and agreement to the plan.  Mabel Mt [place], DO 05/31/24 8:21 AM

## 2024-07-14 ENCOUNTER — Ambulatory Visit (INDEPENDENT_AMBULATORY_CARE_PROVIDER_SITE_OTHER)

## 2024-07-14 VITALS — BP 130/72 | Ht 69.0 in | Wt 208.0 lb

## 2024-07-14 DIAGNOSIS — Z0000 Encounter for general adult medical examination without abnormal findings: Secondary | ICD-10-CM

## 2024-07-14 DIAGNOSIS — Z2821 Immunization not carried out because of patient refusal: Secondary | ICD-10-CM | POA: Diagnosis not present

## 2024-07-14 NOTE — Progress Notes (Signed)
 Because this visit was a virtual/telehealth visit,  certain criteria was not obtained, such a blood pressure, CBG if applicable, and timed get up and go. Any medications not marked as taking were not mentioned during the medication reconciliation part of the visit. Any vitals not documented were not able to be obtained due to this being a telehealth visit or patient was unable to self-report a recent blood pressure reading due to a lack of equipment at home via telehealth. Vitals that have been documented are verbally provided by the patient.  This visit was performed by a medical professional under my direct supervision. I was immediately available for consultation/collaboration. I have reviewed and agree with the Annual Wellness Visit documentation.  Subjective:   Sean Bowers is a 68 y.o. who presents for a Medicare Wellness preventive visit.  As a reminder, Annual Wellness Visits don't include a physical exam, and some assessments may be limited, especially if this visit is performed virtually. We may recommend an in-person follow-up visit with your provider if needed.  Visit Complete: Virtual I connected with  [name] on 07/14/24 by a audio enabled telemedicine application and verified that I am speaking with the correct person using two identifiers.  Patient Location: Home  Provider Location: Home Office  I discussed the limitations of evaluation and management by telemedicine. The patient expressed understanding and agreed to proceed.  Vital Signs: Because this visit was a virtual/telehealth visit, some criteria may be missing or patient reported. Any vitals not documented were not able to be obtained and vitals that have been documented are patient reported.  VideoDeclined- This patient declined Librarian, academic. Therefore the visit was completed with audio only.  Persons Participating in Visit: Patient.  AWV Questionnaire: No: Patient  Medicare AWV questionnaire was not completed prior to this visit.  Cardiac Risk Factors include: advanced age (>95men, >66 women);male gender;obesity (BMI >30kg/m2);dyslipidemia;hypertension     Objective:    Today's Vitals   07/14/24 0806  BP: 130/72  Weight: 208 lb (94.3 kg)  Height: 5' 9 (1.753 m)   Body mass index is 30.72 kg/m.     07/14/2024    8:11 AM 03/28/2024   10:05 PM 11/04/2017    2:15 AM 11/03/2017    5:20 PM  Advanced Directives  Does Patient Have a Medical Advance Directive? No No No  No   Would patient like information on creating a medical advance directive? No - Patient declined  No - Patient declined  No - Patient declined      Data saved with a previous flowsheet row definition    Current Medications (verified) Outpatient Encounter Medications as of 07/14/2024  Medication Sig   amLODipine  (NORVASC ) 5 MG tablet Take 5 mg by mouth daily.   ascorbic acid (VITAMIN C) 500 MG tablet Take 500 mg by mouth daily.   aspirin  81 MG tablet Take 81 mg by mouth daily.   azithromycin  (ZITHROMAX ) 250 MG tablet Take 2 tabs the first day and then 1 tab daily until you run out.   carvedilol  (COREG ) 12.5 MG tablet Take 1 tablet (12.5 mg total) by mouth 2 (two) times daily with a meal.   Cholecalciferol (VITAMIN D -1000 MAX ST) 25 MCG (1000 UT) tablet Take 1,000 Units by mouth daily.   cyclobenzaprine  (FLEXERIL ) 10 MG tablet Take 1 tablet (10 mg total) by mouth 3 (three) times daily as needed for muscle spasms.   loratadine (CLARITIN) 10 MG tablet Take 10 mg by mouth daily.  losartan -hydrochlorothiazide  (HYZAAR) 100-25 MG tablet TAKE 1 TABLET EVERY DAY   meloxicam  (MOBIC ) 15 MG tablet TAKE 1 TABLET EVERY DAY   Multiple Vitamins-Minerals (MULTIVITAMIN MEN PO) Take by mouth.   potassium chloride  (KLOR-CON ) 10 MEQ tablet TAKE 1 TABLET EVERY DAY   rosuvastatin  (CRESTOR ) 20 MG tablet TAKE 1 TABLET EVERY DAY   tamsulosin  (FLOMAX ) 0.4 MG CAPS capsule TAKE 1 CAPSULE EVERY DAY    traZODone  (DESYREL ) 50 MG tablet TAKE 1 TABLET AT BEDTIME   triamcinolone  cream (KENALOG ) 0.1 % Apply 1 Application topically 2 (two) times daily.   No facility-administered encounter medications on file as of 07/14/2024.    Allergies (verified) Penicillins   History: Past Medical History:  Diagnosis Date   Allergy    Arthritis    Asthma    no inhalers   Diverticulitis 09/2016   seen in Urgent Care   Genital warts    History of chicken pox    History of hay fever    Hyperlipidemia 09/27/2016   Hypertension    Past Surgical History:  Procedure Laterality Date   CARPAL TUNNEL RELEASE Bilateral 2006-2007   Family History  Problem Relation Age of Onset   Diabetes Mother    Heart disease Mother    Hypertension Mother    Breast cancer Mother    Arthritis Mother    Cancer Mother        breast   Hyperlipidemia Mother    Heart disease Father    Stroke Father    Arthritis Father    Cancer Father        prostate   Hyperlipidemia Father    Heart disease Paternal Grandmother    Cancer Maternal Uncle        lung   Cancer Paternal Aunt        lung   Cancer Paternal Uncle        lung   Social History   Socioeconomic History   Marital status: Married    Spouse name: Not on file   Number of children: Not on file   Years of education: Not on file   Highest education level: 12th grade  Occupational History   Not on file  Tobacco Use   Smoking status: Never    Passive exposure: Never   Smokeless tobacco: Never  Substance and Sexual Activity   Alcohol use: No   Drug use: No   Sexual activity: Not on file  Other Topics Concern   Not on file  Social History Narrative   Not on file   Social Drivers of Health   Financial Resource Strain: Low Risk  (07/14/2024)   Overall Financial Resource Strain (CARDIA)    Difficulty of Paying Living Expenses: Not hard at all  Food Insecurity: No Food Insecurity (07/14/2024)   Hunger Vital Sign    Worried About Running Out of  Food in the Last Year: Never true    Ran Out of Food in the Last Year: Never true  Transportation Needs: No Transportation Needs (07/14/2024)   PRAPARE - Administrator, Civil Service (Medical): No    Lack of Transportation (Non-Medical): No  Physical Activity: Sufficiently Active (07/14/2024)   Exercise Vital Sign    Days of Exercise per Week: 7 days    Minutes of Exercise per Session: 40 min  Stress: No Stress Concern Present (07/14/2024)   [employer] of Occupational Health - Occupational Stress Questionnaire    Feeling of Stress: Not at all  Social  Connections: Socially Integrated (07/14/2024)   Social Connection and Isolation Panel    Frequency of Communication with Friends and Family: More than three times a week    Frequency of Social Gatherings with Friends and Family: More than three times a week    Attends Religious Services: More than 4 times per year    Active Member of [employer] or Organizations: Yes    Attends Engineer, structural: More than 4 times per year    Marital Status: Married    Tobacco Counseling Counseling given: Not Answered    Clinical Intake:  Pre-visit preparation completed: Yes  Pain : No/denies pain     BMI - recorded: 30.72 Nutritional Status: BMI > 30  Obese Nutritional Risks: None Diabetes: No  Lab Results  Component Value Date   HGBA1C 5.5 06/28/2020   HGBA1C 5.6 05/24/2019   HGBA1C 5.6 09/27/2016     How often do you need to have someone help you when you read instructions, pamphlets, or other written materials from your doctor or pharmacy?: 1 - Never  Interpreter Needed?: No  Information entered by :: Deziya Amero,cma   Activities of Daily Living     07/14/2024    8:09 AM  In your present state of health, do you have any difficulty performing the following activities:  Hearing? 0  Vision? 0  Difficulty concentrating or making decisions? 0  Walking or climbing stairs? 0  Dressing or bathing? 0   Doing errands, shopping? 0  Preparing Food and eating ? N  Using the Toilet? N  In the past six months, have you accidently leaked urine? N  Do you have problems with loss of bowel control? N  Managing your Medications? N  Managing your Finances? N  Housekeeping or managing your Housekeeping? N    Patient Care Team: Frann Mabel Mt, DO as PCP - General (Family Medicine)  I have updated your Care Teams any recent Medical Services you may have received from other providers in the past year.     Assessment:   This is a routine wellness examination for Tasley.  Hearing/Vision screen Hearing Screening - Comments:: No difficulties Vision Screening - Comments:: Patient wears glasses as needed   Goals Addressed             This Visit's Progress    Patient Stated       Patient would like to take more time to relax       Depression Screen     07/14/2024    8:12 AM 11/25/2023    7:45 AM 05/20/2023    7:41 AM 11/12/2022    4:06 PM 08/06/2022    7:40 AM 01/07/2022    7:08 AM 07/04/2021    7:50 AM  PHQ 2/9 Scores  PHQ - 2 Score 0 0 0 0 0 0 0  PHQ- 9 Score 0 0 0        Fall Risk     07/14/2024    8:10 AM 11/25/2023    7:45 AM 05/20/2023    7:41 AM 01/29/2023   11:46 AM 11/12/2022    4:06 PM  Fall Risk   Falls in the past year? 1 0 0 0 0  Number falls in past yr: 0 0 0 0 0  Injury with Fall? 0 0 0 0 0  Risk for fall due to : No Fall Risks  No Fall Risks No Fall Risks No Fall Risks  Follow up Falls evaluation completed Falls evaluation  completed Falls evaluation completed Falls evaluation completed Falls evaluation completed      Data saved with a previous flowsheet row definition    MEDICARE RISK AT HOME:  Medicare Risk at Home Any stairs in or around the home?: Yes If so, are there any without handrails?: No Home free of loose throw rugs in walkways, pet beds, electrical cords, etc?: Yes Adequate lighting in your home to reduce risk of falls?: Yes Life alert?:  No Use of a cane, walker or w/c?: No Grab bars in the bathroom?: Yes Shower chair or bench in shower?: Yes Elevated toilet seat or a handicapped toilet?: Yes  TIMED UP AND GO:  Was the test performed?  No  Cognitive Function: 6CIT completed        07/14/2024    8:08 AM  6CIT Screen  What Year? 0 points  What month? 0 points  What time? 0 points  Count back from 20 0 points  Months in reverse 0 points  Repeat phrase 0 points  Total Score 0 points    Immunizations Immunization History  Administered Date(s) Administered   Influenza,inj,Quad PF,6+ Mos 09/22/2019, 09/05/2022   Influenza-Unspecified 10/02/2018, 08/29/2020, 09/19/2023   PFIZER Comirnaty(Gray Top)Covid-19 Tri-Sucrose Vaccine 01/25/2020, 02/16/2020, 12/15/2020   PNEUMOCOCCAL CONJUGATE-20 05/20/2023   Tdap 04/10/2017   Tetanus 09/27/2006    Screening Tests Health Maintenance  Topic Date Due   Zoster Vaccines- Shingrix (1 of 2) Never done   INFLUENZA VACCINE  06/18/2024   COVID-19 Vaccine (4 - 2024-25 season) 11/17/2024 (Originally 07/20/2023)   Medicare Annual Wellness (AWV)  07/14/2025   Colonoscopy  12/10/2026   DTaP/Tdap/Td (2 - Td or Tdap) 04/11/2027   Pneumococcal Vaccine: 50+ Years  Completed   Hepatitis C Screening  Completed   HPV VACCINES  Aged Out   Meningococcal B Vaccine  Aged Out    Health Maintenance  Health Maintenance Due  Topic Date Due   Zoster Vaccines- Shingrix (1 of 2) Never done   INFLUENZA VACCINE  06/18/2024   Health Maintenance Items Addressed:patient declined the vaccinations , and states he will get them later  Additional Screening:  Vision Screening: Recommended annual ophthalmology exams for early detection of glaucoma and other disorders of the eye. Would you like a referral to an eye doctor? No    Dental Screening: Recommended annual dental exams for proper oral hygiene  Community Resource Referral / Chronic Care Management: CRR required this visit?  No   CCM  required this visit?  No   Plan:    I have personally reviewed and noted the following in the patient's chart:   Medical and social history Use of alcohol, tobacco or illicit drugs  Current medications and supplements including opioid prescriptions. Patient is not currently taking opioid prescriptions. Functional ability and status Nutritional status Physical activity Advanced directives List of other physicians Hospitalizations, surgeries, and ER visits in previous 12 months Vitals Screenings to include cognitive, depression, and falls Referrals and appointments  In addition, I have reviewed and discussed with patient certain preventive protocols, quality metrics, and best practice recommendations. A written personalized care plan for preventive services as well as general preventive health recommendations were provided to patient.   Lyle MARLA Right, [place]   07/14/2024   After Visit Summary: (MyChart) Due to this being a telephonic visit, the after visit summary with patients personalized plan was offered to patient via MyChart   Notes: Nothing significant to report at this time.

## 2024-07-14 NOTE — Patient Instructions (Signed)
 Sean Bowers , Thank you for taking time out of your busy schedule to complete your Annual Wellness Visit with me. I enjoyed our conversation and look forward to speaking with you again next year. I, as well as your care team,  appreciate your ongoing commitment to your health goals. Please review the following plan we discussed and let me know if I can assist you in the future. Your Game plan/ To Do List    Referrals: If you haven't heard from the office you've been referred to, please reach out to them at the phone provided.   Follow up Visits: We will see or speak with you next year for your Next Medicare AWV with our clinical staff Have you seen your provider in the last 6 months (3 months if uncontrolled diabetes)? Yes  Clinician Recommendations:  Aim for 30 minutes of exercise or brisk walking, 6-8 glasses of water, and 5 servings of fruits and vegetables each day.       This is a list of the screenings recommended for you:  Health Maintenance  Topic Date Due   Zoster (Shingles) Vaccine (1 of 2) Never done   Flu Shot  06/18/2024   COVID-19 Vaccine (4 - 2024-25 season) 11/17/2024*   Medicare Annual Wellness Visit  07/14/2025   Colon Cancer Screening  12/10/2026   DTaP/Tdap/Td vaccine (2 - Td or Tdap) 04/11/2027   Pneumococcal Vaccine for age over 11  Completed   Hepatitis C Screening  Completed   HPV Vaccine  Aged Out   Meningitis B Vaccine  Aged Out  *Topic was postponed. The date shown is not the original due date.    Advanced directives: (Copy Requested) Please bring a copy of your health care power of attorney and living will to the office to be added to your chart at your convenience. You can mail to [facility] [address] [place], [place] [address] or email to [email or web address] Advance Care Planning is important because it:  [x]  Makes sure you receive the medical care that is consistent with your values, goals, and preferences  [x]  It  provides guidance to your family and loved ones and reduces their decisional burden about whether or not they are making the right decisions based on your wishes.  Follow the link provided in your after visit summary or read over the paperwork we have mailed to you to help you started getting your Advance Directives in place. If you need assistance in completing these, please reach out to us  so that we can help you!  See attachments for Preventive Care and Fall Prevention Tips.

## 2024-07-15 ENCOUNTER — Other Ambulatory Visit: Payer: Self-pay

## 2024-07-15 ENCOUNTER — Emergency Department (HOSPITAL_BASED_OUTPATIENT_CLINIC_OR_DEPARTMENT_OTHER)
Admission: EM | Admit: 2024-07-15 | Discharge: 2024-07-15 | Disposition: A | Discharge status: Home / Self Care | Attending: Emergency Medicine | Admitting: Emergency Medicine

## 2024-07-15 ENCOUNTER — Encounter (HOSPITAL_BASED_OUTPATIENT_CLINIC_OR_DEPARTMENT_OTHER): Payer: Self-pay | Admitting: Emergency Medicine

## 2024-07-15 DIAGNOSIS — S61412A Laceration without foreign body of left hand, initial encounter: Secondary | ICD-10-CM | POA: Insufficient documentation

## 2024-07-15 DIAGNOSIS — Z7982 Long term (current) use of aspirin: Secondary | ICD-10-CM | POA: Diagnosis not present

## 2024-07-15 DIAGNOSIS — W260XXA Contact with knife, initial encounter: Secondary | ICD-10-CM | POA: Insufficient documentation

## 2024-07-15 DIAGNOSIS — Z23 Encounter for immunization: Secondary | ICD-10-CM | POA: Diagnosis not present

## 2024-07-15 DIAGNOSIS — Z79899 Other long term (current) drug therapy: Secondary | ICD-10-CM | POA: Insufficient documentation

## 2024-07-15 DIAGNOSIS — J45909 Unspecified asthma, uncomplicated: Secondary | ICD-10-CM | POA: Diagnosis not present

## 2024-07-15 DIAGNOSIS — I10 Essential (primary) hypertension: Secondary | ICD-10-CM | POA: Insufficient documentation

## 2024-07-15 MED ORDER — LIDOCAINE-EPINEPHRINE (PF) 2 %-1:200000 IJ SOLN
10.0000 mL | Freq: Once | INTRAMUSCULAR | Status: AC
  Administered 2024-07-15: 10 mL
  Filled 2024-07-15: qty 20

## 2024-07-15 MED ORDER — CLINDAMYCIN HCL 150 MG PO CAPS: 150.0000 mg | ORAL_CAPSULE | Freq: Four times a day (QID) | ORAL | 0 refills | Status: AC

## 2024-07-15 MED ORDER — TETANUS-DIPHTH-ACELL PERTUSSIS 5-2.5-18.5 LF-MCG/0.5 IM SUSY
0.5000 mL | PREFILLED_SYRINGE | Freq: Once | INTRAMUSCULAR | Status: AC
  Administered 2024-07-15: 0.5 mL via INTRAMUSCULAR
  Filled 2024-07-15: qty 0.5

## 2024-07-15 NOTE — ED Notes (Addendum)
 Reviewed discharge instructions, wound care instructions/removal and medications with pt. Pts states understanding . Dressing applied to incision site

## 2024-07-15 NOTE — ED Notes (Signed)
 Patient wound cleaned with sterile saline and covered with non stick guaze and wrapped. Pt provided supplies to change dressing.

## 2024-07-15 NOTE — ED Provider Notes (Signed)
 [facility] Provider Note   CSN: [id number] Arrival date & time: 07/15/24  1241     Patient presents with: Laceration   Sean Bowers is a 68 y.o. male.    Laceration   68 year old male presents emergency department with complaints of left hand laceration.  Was trying to put a screen into a window where the screen had busted with a putty knife when his hand slipped causing him to cut the his left hand.  Denies any weakness or sensory deficits of affected hand.  States that he is unknown of his last Tdap and wants an update.  Denies any pain/injury elsewhere.  Past medical history significant for hypertension, hyperlipidemia, arthritis, diverticulitis, asthma, allergy  Prior to Admission medications   Medication Sig Start Date End Date Taking? Authorizing Provider  amLODipine  (NORVASC ) 5 MG tablet Take 5 mg by mouth daily. 02/19/24   [provider]  ascorbic acid (VITAMIN C) 500 MG tablet Take 500 mg by mouth daily.    [provider]  aspirin  81 MG tablet Take 81 mg by mouth daily.    [provider]  azithromycin  (ZITHROMAX ) 250 MG tablet Take 2 tabs the first day and then 1 tab daily until you run out. 03/04/24   Frann Mabel Mt, DO  carvedilol  (COREG ) 12.5 MG tablet Take 1 tablet (12.5 mg total) by mouth 2 (two) times daily with a meal. 01/26/24   Wendling, Mabel Mt, DO  Cholecalciferol (VITAMIN D -1000 MAX ST) 25 MCG (1000 UT) tablet Take 1,000 Units by mouth daily.    [provider]  cyclobenzaprine  (FLEXERIL ) 10 MG tablet Take 1 tablet (10 mg total) by mouth 3 (three) times daily as needed for muscle spasms. 02/13/24   Cyndi Shaver, PA-C  loratadine (CLARITIN) 10 MG tablet Take 10 mg by mouth daily.    [provider]  losartan -hydrochlorothiazide  (HYZAAR) 100-25 MG tablet TAKE 1 TABLET EVERY DAY 05/05/24   Frann Mabel Mt, DO  meloxicam  (MOBIC ) 15 MG tablet TAKE 1  TABLET EVERY DAY 12/19/23   Wendling, Mabel Mt, DO  Multiple Vitamins-Minerals (MULTIVITAMIN MEN PO) Take by mouth.    [provider]  potassium chloride  (KLOR-CON ) 10 MEQ tablet TAKE 1 TABLET EVERY DAY 03/17/24   Frann Mabel Mt, DO  rosuvastatin  (CRESTOR ) 20 MG tablet TAKE 1 TABLET EVERY DAY 05/05/24   Frann Mabel Mt, DO  tamsulosin  (FLOMAX ) 0.4 MG CAPS capsule TAKE 1 CAPSULE EVERY DAY 05/31/24   Frann, Mabel Mt, DO  traZODone  (DESYREL ) 50 MG tablet TAKE 1 TABLET AT BEDTIME 05/31/24   Wendling, Mabel Mt, DO  triamcinolone  cream (KENALOG ) 0.1 % Apply 1 Application topically 2 (two) times daily. 01/26/24   Frann Mabel Mt, DO    Allergies: Penicillins    Review of Systems  All other systems reviewed and are negative.   Updated Vital Signs BP (!) 143/76   Pulse 67   Temp 98.1 F (36.7 C) (Oral)   Resp 15   Ht 5' 9 (1.753 m)   Wt 95.3 kg   SpO2 99%   BMI 31.01 kg/m   Physical Exam Vitals and nursing note reviewed.  Constitutional:      General: He is not in acute distress.    Appearance: He is well-developed.  HENT:     Head: Normocephalic and atraumatic.  Eyes:     Conjunctiva/sclera: Conjunctivae normal.  Cardiovascular:     Rate and Rhythm: Normal rate and regular rhythm.  Pulmonary:  Effort: Pulmonary effort is normal. No respiratory distress.     Breath sounds: Normal breath sounds.  Abdominal:     Palpations: Abdomen is soft.     Tenderness: There is no abdominal tenderness.  Musculoskeletal:        General: No swelling.     Cervical back: Neck supple.     Comments: Range of motion digits of left hand.  Radial pulses 2+ bilaterally.  Patient with 2.7 cm laceration proximal pad of thumb.  No obvious tendinous involvement.  No obvious foreign body.  No obvious bony tenderness on exam.  Skin:    General: Skin is warm and dry.     Capillary Refill: Capillary refill takes less than 2 seconds.  Neurological:      Mental Status: He is alert.  Psychiatric:        Mood and Affect: Mood normal.     (all labs ordered are listed, but only abnormal results are displayed) Labs Reviewed - No data to display  EKG: None  Radiology: No results found.   .Laceration Repair  Date/Time: 07/15/2024 1:25 PM  Performed by: Silver Wonda LABOR, PA Authorized by: Silver Wonda LABOR, PA   Consent:    Consent obtained:  Verbal   Consent given by:  Patient   Risks, benefits, and alternatives were discussed: yes     Risks discussed:  Nerve damage, need for additional repair, infection, poor wound healing, poor cosmetic result and pain   Alternatives discussed:  No treatment, delayed treatment and observation Universal protocol:    Procedure explained and questions answered to patient or proxy's satisfaction: yes     Relevant documents present and verified: yes     Patient identity confirmed:  Verbally with patient Anesthesia:    Anesthesia method:  Local infiltration   Local anesthetic:  Lidocaine  2% WITH epi Laceration details:    Location:  Hand   Hand location:  L palm   Length (cm):  2.7 Pre-procedure details:    Preparation:  Patient was prepped and draped in usual sterile fashion Exploration:    Limited defect created (wound extended): no     Hemostasis achieved with:  Direct pressure   Imaging outcome: foreign body not noted     Wound exploration: wound explored through full range of motion and entire depth of wound visualized     Contaminated: no   Treatment:    Area cleansed with:  Saline and povidone-iodine   Amount of cleaning:  Standard   Irrigation solution:  Sterile water   Irrigation volume:  500cc   Irrigation method:  Pressure wash   Visualized foreign bodies/material removed: no     Debridement:  None   Undermining:  None   Scar revision: no   Skin repair:    Repair method:  Sutures   Suture size:  4-0   Suture material:  Prolene   Suture technique:  Simple interrupted    Number of sutures:  4 Approximation:    Approximation:  Close Repair type:    Repair type:  Simple Post-procedure details:    Dressing:  Non-adherent dressing   Procedure completion:  Tolerated well, no immediate complications    Medications Ordered in the ED  lidocaine -EPINEPHrine  (XYLOCAINE  W/EPI) 2 %-1:200000 (PF) injection 10 mL (has no administration in time range)  Tdap (BOOSTRIX ) injection 0.5 mL (has no administration in time range)  Medical Decision Making Risk Prescription drug management.   This patient presents to the ED for concern of laceration, this involves an extensive number of treatment options, and is a complaint that carries with it a high risk of complications and morbidity.  The differential diagnosis includes fracture, strain/sprain, dislocation, ligamentous/tendinous injury, neurovasc compromise, foreign body retainment, other   Co morbidities that complicate the patient evaluation  See HPI   Additional history obtained:  Additional history obtained from EMR External records from outside source obtained and reviewed including hospital records   Lab Tests:  N/a   Imaging Studies ordered:  N/a   Cardiac Monitoring: / EKG:  N/a   Consultations Obtained:  N/a   Problem List / ED Course / Critical interventions / Medication management  Left hand laceration I ordered medication including Tdap, lidocaine  with epinephrine    Reevaluation of the patient after these medicines showed that the patient improved I have reviewed the patients home medicines and have made adjustments as needed   Social Determinants of Health:  Denies tobacco, illicit drug use.   Test / Admission - Considered:  Hand laceration Vitals signs significant for hypertension blood pressure 143/76. Otherwise within normal range and stable throughout visit. 68 year old male presents emergency department with complaints of left  hand laceration.  Was trying to put a screen into a window where the screen had busted with a putty knife when his hand slipped causing him to cut the his left hand.  Denies any weakness or sensory deficits of affected hand.  States that he is unknown of his last Tdap and wants an update.  Denies any pain/injury elsewhere. On exam, 2.7 cm laceration appreciated pad of left thumb as above.  Laceration clean, anesthetized and repaired manner as above.  Evidence clinically of any tendinous involvement.  No residual weakness or sensory deficits.  Will recommend local wound care at home, follow-up for suture removal.  Treatment plan discussed with patient he acknowledged understanding was agreeable to said plan.  Patient well-appearing, afebrile in no acute distress. Worrisome signs and symptoms were discussed with the patient, and the patient acknowledged understanding to return to the ED if noticed. Patient was stable upon discharge.       Final diagnoses:  None    ED Discharge Orders     None          Silver Wonda LABOR, [place] 07/15/24 1409    Cottie Donnice PARAS, MD 07/15/24 [phone]

## 2024-07-15 NOTE — ED Triage Notes (Signed)
 Pt reports laceration to L hand appx 1 hr pta.  Unknown tetanus status.

## 2024-07-15 NOTE — Discharge Instructions (Signed)
 As discussed, your laceration was repaired using nonabsorbable stitches.  This should be removed in around 7 to 10 days.  Recommend washing area gently with warm soapy water and patting dry.  Will patient on antibiotics to prevent infection.  Recommend follow-up with your primary care for reassessment.  You may return to urgent care, primary care or back in the emergency department to have the stitches removed.

## 2024-07-22 ENCOUNTER — Ambulatory Visit (INDEPENDENT_AMBULATORY_CARE_PROVIDER_SITE_OTHER): Admitting: Medical

## 2024-07-22 VITALS — BP 122/82 | HR 63 | Temp 98.1°F | Resp 16 | Ht 69.0 in | Wt 214.4 lb

## 2024-07-22 DIAGNOSIS — Z5189 Encounter for other specified aftercare: Secondary | ICD-10-CM

## 2024-07-22 NOTE — Progress Notes (Signed)
   Subjective:    Patient ID: Sean Bowers, male    DOB: 1955-12-15, 68 y.o.   MRN: [id number]  HPI Sean Bowers is a 68 year old male who presents for suture removal following a left hand laceration.  He sustained a laceration on his left hand between the wrist and thumb on July 15, 2024, while installing a screen into a window. The screen broke, and a putty knife caused the injury. The laceration, measuring 2.7 centimeters, was closed with four sutures in the emergency department. His tetanus vaccination was updated at that time.  There has been no discharge from the wound site for the past three days. Initially, there was a clear, watery, reddish drainage, but it was never yellow. No current discharge, weeping, or signs of infection.  He is not diabetic and remains active.   Review of Systems See hpi    Objective:   Physical Exam  General-no acute distress   Left hand- laceration with sutures intact at base of left thumb, no signs of infection or discharge, good range of motion. Sutures not buried. edges of skin intact. Though think would benefit from full 10 days before removing sutures.     Assessment & Plan:   Patient Instructions  Left hand laceration, post suture repair Laceration repaired with four sutures, no infection present. Risk of dehiscence if sutures removed prematurely at 7 days. Think best to remove next monday near 10 day mark.. - Schedule suture removal for Monday, September 8th at 7:40 AM. - Advise keeping sutures covered with a Band-Aid until removal. - Instruct to limit excessive activity with left wrist and hand until sutures are removed. - Follow up if signs of infection or complications arise.   Renezmae Canlas, PA-C

## 2024-07-22 NOTE — Patient Instructions (Signed)
 Left hand laceration, post suture repair Laceration repaired with four sutures, no infection present. Risk of dehiscence if sutures removed prematurely at 7 days. Think best to remove next monday near 10 day mark.. - Schedule suture removal for Monday, September 8th at 7:40 AM. - Advise keeping sutures covered with a Band-Aid until removal. - Instruct to limit excessive activity with left wrist and hand until sutures are removed. - Follow up if signs of infection or complications arise.

## 2024-07-26 ENCOUNTER — Ambulatory Visit (INDEPENDENT_AMBULATORY_CARE_PROVIDER_SITE_OTHER): Admitting: Medical

## 2024-07-26 VITALS — BP 130/90 | HR 51 | Temp 98.2°F | Resp 15 | Ht 69.0 in | Wt 216.4 lb

## 2024-07-26 DIAGNOSIS — Z4802 Encounter for removal of sutures: Secondary | ICD-10-CM | POA: Diagnosis not present

## 2024-07-26 NOTE — Patient Instructions (Addendum)
 Suture removal Mild irritation with slight redness, no infection signs. Partial skin growth over sutures made removal mild difficult. 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 - Advise caution with area. -explained one suture more difficulty to remove than other 3. Follow up for any wound complication which would not expect. -Follow up as regularly scheduled.

## 2024-07-26 NOTE — Progress Notes (Signed)
   Subjective:    Patient ID: Sean Bowers, male    DOB: 18-Apr-1956, 68 y.o.   MRN: [id number]  HPI   Sean Bowers is a 68 year old male who presents for suture removal.  The skin around the sutures is slightly pinkish red.11 days post suture placement. Last week came in and had concerns that wound was not healed completely so had rescheduled to come back today for removal of suture. Now laceration line well healed.   Since the last visit, there has been no yellow discharge, pain, or bleeding  from the suture site.  Now in for suture removal.   See last visit note.         Objective:   Physical Exam  General Left hand-laceration with sutures intact at base of left thumb, no signs of infection or discharge, good range of motion(mild pinkish red which I think is from suture irritation). Sutures partially buried. edges of skin intact well healed today. No  obvious retained suture material was observed.(3rd suture removed more difficulty and skin more over grown than others) total 4 sutures in place simple interupted.      Assessment & Plan:   Patient Instructions  Suture removal Mild irritation with slight redness, no infection signs. Partial skin growth over sutures made removal mild difficult. 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 - Advise caution with area. -explained one suture more difficulty to remove than other 3. Follow up for any wound complication which would not expect. -Follow up as regularly scheduled.

## 2024-08-10 ENCOUNTER — Ambulatory Visit: Payer: Self-pay | Admitting: *Deleted

## 2024-08-10 NOTE — Telephone Encounter (Signed)
 Copied from CRM #[id number]. Topic: Clinical - Red Word Triage >> Aug 10, 2024  8:10 AM Frederich PARAS wrote: [facility] that prompted transfer to Nurse Triage: high blood pressure, head aches   pt calling, he have checked bp for 2 days and its running high, bad headaches, 1 day it was 163/92 Reason for Disposition  [1] Systolic BP >= 130 OR Diastolic >= 80 AND [2] taking BP medications  Answer Assessment - Initial Assessment Questions 1. BLOOD PRESSURE: What is your blood pressure? Did you take at least two measurements 5 minutes apart?      162/95 this morning.  It's been up for the last couple of days.  No new medication.    I'm having headaches too.    2. ONSET: When did you take your blood pressure?     This morning 3. HOW: How did you take your blood pressure? (e.g., automatic home BP monitor, visiting nurse)     Home kit  4. HISTORY: Do you have a history of high blood pressure?     Yes 5. MEDICINES: Are you taking any medicines for blood pressure? Have you missed any doses recently?     Yes been on medication for a few years. 6. OTHER SYMPTOMS: Do you have any symptoms? (e.g., blurred vision, chest pain, difficulty breathing, headache, weakness)     Headaches.   No dizziness. 7. PREGNANCY: Is there any chance you are pregnant? When was your last menstrual period?     N/A  Protocols used: Blood Pressure - High-A-AH FYI Only or Action Required?: FYI only for provider.  Patient was last seen in primary care on 07/26/2024 by Dorina Loving, PA-C.  Called Nurse Triage reporting Hypertension.  Symptoms began several days ago.  Interventions attempted: Prescription medications: taking rx medications.  Symptoms are: gradually worsening.headaches  Triage Disposition: See PCP Within 2 Weeks  Patient/caregiver understands and will follow disposition?: Yes

## 2024-08-10 NOTE — Telephone Encounter (Signed)
 Appt scheduled

## 2024-08-11 ENCOUNTER — Encounter: Payer: Self-pay | Admitting: Family Medicine

## 2024-08-11 ENCOUNTER — Ambulatory Visit (INDEPENDENT_AMBULATORY_CARE_PROVIDER_SITE_OTHER): Admitting: Family Medicine

## 2024-08-11 VITALS — BP 114/72 | HR 58 | Temp 98.0°F | Resp 18 | Ht 69.0 in | Wt 216.8 lb

## 2024-08-11 DIAGNOSIS — I10 Essential (primary) hypertension: Secondary | ICD-10-CM

## 2024-08-11 DIAGNOSIS — G4700 Insomnia, unspecified: Secondary | ICD-10-CM

## 2024-08-11 MED ORDER — TRAZODONE HCL 100 MG PO TABS: 100.0000 mg | ORAL_TABLET | Freq: Every day | ORAL | 1 refills | Status: DC

## 2024-08-11 NOTE — Progress Notes (Signed)
 Chief Complaint  Patient presents with   Hypertension    HA    Subjective Sean Bowers is a 68 y.o. male who presents for hypertension follow up. Here w spouse.  He does monitor home blood pressures. Blood pressures ranging from 150-160's/90's on average over past couple days. He is compliant with medications- Hyzaar 100-25 mg/d, Norvasc  5 mg/d, Coreg  12.5 mg bid. Patient has these side effects of medication: none He is adhering to a healthy diet overall. Current exercise: walking, active with handyman work No CP or SOB.   Patient has been having trouble sleeping.  He takes trazodone  50 mg nightly.  Compliant, no adverse effects.  Does not describe himself as an anxious individual.  He is not seeing a therapist.  He has been taking Tylenol  PM to help him sleep.  He took melatonin instead and reports poor/choppy sleep.   Past Medical History:  Diagnosis Date   Allergy    Arthritis    Asthma    no inhalers   Diverticulitis 09/2016   seen in Urgent Care   Genital warts    History of chicken pox    History of hay fever    Hyperlipidemia 09/27/2016   Hypertension     Exam BP 114/72   Pulse (!) 58   Temp 98 F (36.7 C)   Resp 18   Ht 5' 9 (1.753 m)   Wt 216 lb 12.8 oz (98.3 kg)   SpO2 96%   BMI 32.02 kg/m  General:  well developed, well nourished, in no apparent distress Heart: Reg rhythm, bradycardic, no bruits, no LE edema Lungs: clear to auscultation, no accessory muscle use Psych: well oriented with normal range of affect and appropriate judgment/insight  Essential hypertension  Insomnia, unspecified type - Plan: traZODone  (DESYREL ) 100 MG tablet  Chronic, stable.  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  Increase trazodone  from 50 mg/d to 100 mg/d. Cont Coreg  12.5 mg bid, Hyzaar 100-25 mg/d, Norvasc  5 mg/d. Counseled on diet and exercise.  Monitor blood pressure at home.  If it differs from  reading today, they will ask their nurse and daughter to help calibrate the device.  Consider bringing the blood pressure monitor to the next visit. Chronic, not controlled.  Increase trazodone  from 50 mg nightly to 100 mg nightly.  Okay to continue melatonin as needed.  F/u in 1 mo. Try to avoid Tylenol  PM routinely. The patient and spouse voiced understanding and agreement to the plan.  Mabel Mt [place], DO 08/11/24  4:54 PM

## 2024-08-11 NOTE — Patient Instructions (Signed)
 Check BP at home. If it has normalized, we are done. If it is still high, consider recalibrating it or having Courtney check.   Let us  know if you need anything.

## 2024-08-24 ENCOUNTER — Telehealth: Payer: Self-pay | Admitting: Sleep Medicine

## 2024-08-24 NOTE — Telephone Encounter (Signed)
 Copied from CRM [id number]. Topic: Clinical - Order For Equipment >> Aug 23, 2024  4:46 PM Devaughn RAMAN wrote: Reason for CRM: Patient wife Joen called and stated the patient has only gotten 3 mask, no tubing no filters no tank since the very beginning and [employer] called and  stated the order was denied three times, Patient wife Joen stated she was not going to call [employer] anymore because they were rude and advised his supplies were denied.

## 2024-08-25 NOTE — Telephone Encounter (Unsigned)
 Copied from CRM #[id number]. Topic: Clinical - Order For Equipment >> Aug 25, 2024  2:12 PM Celestine FALCON wrote: Reason for CRM: Pt's wife Alessio Bogan on [place] is calling back today after speaking with [employer] on 08/23/2024 regarding DME supplies being denied for CPAP.   Below is the copy of the previous CRM that was on 08/23/2024 forwarded to the front desk at 08/24/2024 at 805am. Copied from CRM #[id number]. Topic: Clinical - Order For Equipment >> Aug 23, 2024  4:46 PM Devaughn RAMAN wrote: Reason for CRM: Patient wife Joen called and stated the patient has only gotten 3 mask, no tubing no filters no tank since the very beginning and [employer] called and  stated the order was denied three times, Patient wife Joen stated she was not going to call [employer] anymore because they were rude and advised his supplies were denied.  Pt stated she has not received a call from the clinic yet after being told she would be getting a call back by EOD on 08/24/2024. I called [employer] and was told by front desk to send over another CRM and attach it to the current CRM, but I cannot because it was listed as resolved already.   We aren't sure if the DME company needs the last in person notes and or something in writing from the provider or not, but the pt's spouse thinks this may have been a reason for the denial multiple times.   Please call the pt back. Pt's phone number is [phone] ok to leave a vm.

## 2024-08-25 NOTE — Telephone Encounter (Signed)
 I have sent an urgent message to [employer] about this issue

## 2024-08-26 NOTE — Telephone Encounter (Signed)
 Noted, NFN

## 2024-08-26 NOTE — Telephone Encounter (Signed)
 I have received a message from [place] with [employer]: Patient is having issues getting what is needed for Cpap please see attached phone notes Received: Today Joylene Cain   The patient has gotten Supplies recently ( 06-23-2024). Looks like they recieved supplies on 04-02-2024 and upon setup 03/11/2024 so I am not sure what is needed. Tubing was inclueded in both 03/11/2024 and 06-23-2024 orders along with filters, cushions and Head gear. Patient is only eligable ever 3 months it looks like.  I will forward your message to sales and resupply to see if they can see whats going on and how we can assist.

## 2024-09-09 ENCOUNTER — Ambulatory Visit (INDEPENDENT_AMBULATORY_CARE_PROVIDER_SITE_OTHER): Admitting: Family Medicine

## 2024-09-09 VITALS — BP 128/68 | HR 62 | Temp 97.9°F | Resp 18 | Ht 69.0 in | Wt 191.8 lb

## 2024-09-09 DIAGNOSIS — J4520 Mild intermittent asthma, uncomplicated: Secondary | ICD-10-CM

## 2024-09-09 DIAGNOSIS — Z23 Encounter for immunization: Secondary | ICD-10-CM

## 2024-09-09 DIAGNOSIS — R053 Chronic cough: Secondary | ICD-10-CM

## 2024-09-09 DIAGNOSIS — G4700 Insomnia, unspecified: Secondary | ICD-10-CM

## 2024-09-09 MED ORDER — AIRSUPRA 90-80 MCG/ACT IN AERO: 2.0000 | INHALATION_SPRAY | Freq: Four times a day (QID) | RESPIRATORY_TRACT | 2 refills | Status: DC | PRN

## 2024-09-09 NOTE — Progress Notes (Signed)
 Chief Complaint  Patient presents with   Hypertension   Follow-up    Subjective: Patient is a 68 y.o. male here for f/u insomnia.  Trazodone  increased from 50 mg/d to 100 mg/d.  Compliant, no adverse effects.  Reports he is sleeping very well now.  He does not wish to make any changes.  Patient has had a cough for the past several months.  This initially happened when he was taking lisinopril .  He was changed to losartan -hydrochlorothiazide  and his cough went away.  It resurfaced recently despite no medication changes.  He has associated chest tightness and wheezing.  He does have a remote history of asthma and allergies.  He does not have a runny or stuffy nose.  No association with meals or position.  He has no reflux symptoms.  Past Medical History:  Diagnosis Date   Allergy    Arthritis    Asthma    no inhalers   Diverticulitis 09/2016   seen in Urgent Care   Genital warts    History of chicken pox    History of hay fever    Hyperlipidemia 09/27/2016   Hypertension     Objective: BP 128/68 (BP Location: Left Arm, Patient Position: Sitting, Cuff Size: Large)   Pulse 62   Temp 97.9 F (36.6 C) (Oral)   Resp 18   Ht 5' 9 (1.753 m)   Wt 191 lb 12.8 oz (87 kg)   SpO2 97%   BMI 28.32 kg/m  General: Awake, appears stated age Nose: Nares are patent without rhinorrhea Mouth: MMM, no pharyngeal exudate or erythema, no oropharyngeal drainage Heart: RRR, no LE edema Lungs: CTAB, no rales, wheezes or rhonchi. No accessory muscle use Psych: Age appropriate judgment and insight, normal affect and mood  Assessment and Plan: Insomnia, unspecified type  Chronic cough  Mild intermittent asthma without complication - Plan: Albuterol -Budesonide (AIRSUPRA) 90-80 MCG/ACT AERO  Chronic, stable.  Continue trazodone  100 mg daily. 2/3.  Chronic, unstable.  Start Airsupra as needed as above.  I expect this will help with both coughing and chest tightness/shortness of breath.  I will  see him in 1 month to see how things are going.  Could consider INCS at that time versus referral to pulmonology. The patient voiced understanding and agreement to the plan.  Mabel Mt [place], DO 09/09/24  10:47 AM

## 2024-09-09 NOTE — Patient Instructions (Addendum)
 Pepcid/famotidine 20 mg 1-2 times daily can help with reflux symptoms.   Keep the diet clean and stay active.  Let me know if there are cost issues.   Let us  know if you need anything.

## 2024-09-10 ENCOUNTER — Encounter: Payer: Self-pay | Admitting: Family Medicine

## 2024-09-22 ENCOUNTER — Other Ambulatory Visit: Payer: Self-pay

## 2024-09-22 DIAGNOSIS — J4520 Mild intermittent asthma, uncomplicated: Secondary | ICD-10-CM

## 2024-09-22 MED ORDER — AIRSUPRA 90-80 MCG/ACT IN AERO: 2.0000 | INHALATION_SPRAY | Freq: Four times a day (QID) | RESPIRATORY_TRACT | 2 refills | Status: AC | PRN

## 2024-09-22 NOTE — Telephone Encounter (Signed)
 Copied from CRM #[id number]. Topic: Clinical - Medication Question >> Sep 22, 2024  3:11 PM Dedra B wrote: Reason for CRM: Ileana from [facility] said that pt would like his prescription for Albuterol -Budesonide (AIRSUPRA) 90-80 MCG/ACT AERO transferred to [facility]. Pls call [phone] if any questions.

## 2024-09-22 NOTE — Telephone Encounter (Signed)
 Refill sent and sent pt message letting him know medication was sent.

## 2024-09-29 ENCOUNTER — Other Ambulatory Visit: Payer: Self-pay | Admitting: Urology

## 2024-09-29 DIAGNOSIS — C61 Malignant neoplasm of prostate: Secondary | ICD-10-CM

## 2024-10-06 ENCOUNTER — Other Ambulatory Visit: Payer: Self-pay | Admitting: Family Medicine

## 2024-10-06 DIAGNOSIS — M25571 Pain in right ankle and joints of right foot: Secondary | ICD-10-CM

## 2024-11-20 ENCOUNTER — Other Ambulatory Visit: Payer: Self-pay | Admitting: Family Medicine

## 2024-11-20 DIAGNOSIS — I10 Essential (primary) hypertension: Secondary | ICD-10-CM

## 2024-11-24 ENCOUNTER — Other Ambulatory Visit

## 2024-11-30 ENCOUNTER — Ambulatory Visit (INDEPENDENT_AMBULATORY_CARE_PROVIDER_SITE_OTHER): Admitting: Family Medicine

## 2024-11-30 ENCOUNTER — Encounter: Payer: Self-pay | Admitting: Family Medicine

## 2024-11-30 VITALS — BP 134/78 | HR 71 | Temp 98.0°F | Resp 16 | Ht 69.0 in | Wt 220.2 lb

## 2024-11-30 DIAGNOSIS — M25562 Pain in left knee: Secondary | ICD-10-CM

## 2024-11-30 MED ORDER — TRAMADOL HCL 50 MG PO TABS: 50.0000 mg | ORAL_TABLET | Freq: Three times a day (TID) | ORAL | 0 refills | Status: AC | PRN

## 2024-11-30 NOTE — Patient Instructions (Addendum)
 Ice/cold pack over area for 10-15 min twice daily.  Heat (pad or rice pillow in microwave) over affected area, 10-15 minutes twice daily.   OK to take Tylenol  1000 mg (2 extra strength tabs) or 975 mg (3 regular strength tabs) every 6 hours as needed.  Do not drink alcohol, do any illicit/street drugs, drive or do anything that requires alertness while on this medicine.   Let us  know if you need anything.  Knee Exercises It is normal to feel mild stretching, pulling, tightness, or discomfort as you do these exercises, but you should stop right away if you feel sudden pain or your pain gets worse.  STRETCHING AND RANGE OF MOTION EXERCISES  These exercises warm up your muscles and joints and improve the movement and flexibility of your knee. These exercises also help to relieve pain, numbness, and tingling. Exercise A: Knee Extension, Prone  Lie on your abdomen on a bed. Place your left / right knee just beyond the edge of the surface so your knee is not on the bed. You can put a towel under your left / right thigh just above your knee for comfort. Relax your leg muscles and allow gravity to straighten your knee. You should feel a stretch behind your left / right knee. Hold this position for 30 seconds. Scoot up so your knee is supported between repetitions. Repeat 2 times. Complete this stretch 3 times per week. Exercise B: Knee Flexion, Active     Lie on your back with both knees straight. If this causes back discomfort, bend your left / right knee so your foot is flat on the floor. Slowly slide your left / right heel back toward your buttocks until you feel a gentle stretch in the front of your knee or thigh. Hold this position for 30 seconds. Slowly slide your left / right heel back to the starting position. Repeat 2 times. Complete this exercise 3 times per week. Exercise C: Quadriceps, Prone     Lie on your abdomen on a firm surface, such as a bed or padded floor. Bend your left  / right knee and hold your ankle. If you cannot reach your ankle or pant leg, loop a belt around your foot and grab the belt instead. Gently pull your heel toward your buttocks. Your knee should not slide out to the side. You should feel a stretch in the front of your thigh and knee. Hold this position for 30 seconds. Repeat 2 times. Complete this stretch 3 times per week. Exercise D: Hamstring, Supine  Lie on your back. Loop a belt or towel over the ball of your left / right foot. The ball of your foot is on the walking surface, right under your toes. Straighten your left / right knee and slowly pull on the belt to raise your leg until you feel a gentle stretch behind your knee. Do not let your left / right knee bend while you do this. Keep your other leg flat on the floor. Hold this position for 30 seconds. Repeat 2 times. Complete this stretch 3 times per week. STRENGTHENING EXERCISES  These exercises build strength and endurance in your knee. Endurance is the ability to use your muscles for a long time, even after they get tired. Exercise E: Quadriceps, Isometric     Lie on your back with your left / right leg extended and your other knee bent. Put a rolled towel or small pillow under your knee if told by your health  care provider. Slowly tense the muscles in the front of your left / right thigh. You should see your kneecap slide up toward your hip or see increased dimpling just above the knee. This motion will push the back of the knee toward the floor. For 3 seconds, keep the muscle as tight as you can without increasing your pain. Relax the muscles slowly and completely. Repeat for 10 total reps Repeat 2 ti mes. Complete this exercise 3 times per week. Exercise F: Straight Leg Raises - Quadriceps  Lie on your back with your left / right leg extended and your other knee bent. Tense the muscles in the front of your left / right thigh. You should see your kneecap slide up or see increased  dimpling just above the knee. Your thigh may even shake a bit. Keep these muscles tight as you raise your leg 4-6 inches (10-15 cm) off the floor. Do not let your knee bend. Hold this position for 3 seconds. Keep these muscles tense as you lower your leg. Relax your muscles slowly and completely after each repetition. 10 total reps. Repeat 2 times. Complete this exercise 3 times per week.  Exercise G: Hamstring Curls     If told by your health care provider, do this exercise while wearing ankle weights. Begin with 5 lb weights (optional). Then increase the weight by 1 lb (0.5 kg) increments. Do not wear ankle weights that are more than 20 lbs to start with. Lie on your abdomen with your legs straight. Bend your left / right knee as far as you can without feeling pain. Keep your hips flat against the floor. Hold this position for 3 seconds. Slowly lower your leg to the starting position. Repeat for 10 reps.  Repeat 2 times. Complete this exercise 3 times per week. Exercise H: Squats (Quadriceps)  Stand in front of a table, with your feet and knees pointing straight ahead. You may rest your hands on the table for balance but not for support. Slowly bend your knees and lower your hips like you are going to sit in a chair. Keep your weight over your heels, not over your toes. Keep your lower legs upright so they are parallel with the table legs. Do not let your hips go lower than your knees. Do not bend lower than told by your health care provider. If your knee pain increases, do not bend as low. Hold the squat position for 1 second. Slowly push with your legs to return to standing. Do not use your hands to pull yourself to standing. Repeat 2 times. Complete this exercise 3 times per week. Exercise I: Wall Slides (Quadriceps)     Lean your back against a smooth wall or door while you walk your feet out 18-24 inches (46-61 cm) from it. Place your feet hip-width apart. Slowly slide down the  wall or door until your knees Repeat 2 times. Complete this exercise every other day. Exercise K: Straight Leg Raises - Hip Abductors  Lie on your side with your left / right leg in the top position. Lie so your head, shoulder, knee, and hip line up. You may bend your bottom knee to help you keep your balance. Roll your hips slightly forward so your hips are stacked directly over each other and your left / right knee is facing forward. Leading with your heel, lift your top leg 4-6 inches (10-15 cm). You should feel the muscles in your outer hip lifting. Do not let your  foot drift forward. Do not let your knee roll toward the ceiling. Hold this position for 3 seconds. Slowly return your leg to the starting position. Let your muscles relax completely after each repetition. 10 total reps. Repeat 2 times. Complete this exercise 3 times per week. Exercise J: Straight Leg Raises - Hip Extensors  Lie on your abdomen on a firm surface. You can put a pillow under your hips if that is more comfortable. Tense the muscles in your buttocks and lift your left / right leg about 4-6 inches (10-15 cm). Keep your knee straight as you lift your leg. Hold this position for 3 seconds. Slowly lower your leg to the starting position. Let your leg relax completely after each repetition. Repeat 2 times. Complete this exercise 3 times per week. Document Released: 09/18/2005 Document Revised: 07/29/2016 Document Reviewed: 09/10/2015 Elsevier Interactive Patient Education  2017 [employer].

## 2024-11-30 NOTE — Progress Notes (Signed)
 Musculoskeletal Exam  Patient: Sean Bowers DOB: 07/12/1956  DOS: 11/30/2024  SUBJECTIVE:  Chief Complaint:   Chief Complaint  Patient presents with   Knee Pain    Knee Pain     Sean Bowers is a 70 y.o.  male for evaluation and treatment of L knee pain.   Onset:  1 week ago. No inj or change in activity.  Location: front of knee Character:  dull  Progression of issue:  is improving Associated symptoms: swelling Nml ROM, no bruising, redness Treatment: to date has been a topical ess oil.   Neurovascular symptoms: no  Past Medical History:  Diagnosis Date   Allergy    Arthritis    Asthma    no inhalers   Diverticulitis 09/2016   seen in Urgent Care   Genital warts    History of chicken pox    History of hay fever    Hyperlipidemia 09/27/2016   Hypertension     Objective: VITAL SIGNS: BP 134/78 (BP Location: Left Arm, Patient Position: Sitting)   Pulse 71   Temp 98 F (36.7 C) (Oral)   Resp 16   Ht 5' 9 (1.753 m)   Wt 220 lb 3.2 oz (99.9 kg)   SpO2 96%   BMI 32.52 kg/m  Constitutional: Well formed, well developed. No acute distress. Thorax & Lungs: No accessory muscle use Musculoskeletal: L knee.   Normal active range of motion: yes.   Normal passive range of motion: yes Tenderness to palpation: Mild TTP over the lateral plica Deformity: no Ecchymosis: no Tests positive: none Tests negative: Lachman's, varus/valgus, patellar apprehension/grind, Stines Neurologic: Normal sensory function. Gait nml.  Psychiatric: Normal mood. Age appropriate judgment and insight. Alert & oriented x 3.    Assessment:  Acute pain of left knee  Plan: Stretches/exercises, heat, ice, Tylenol .  Tramadol  for breakthrough pain.  Warnings about this medicine verbalized and written down.  Consider physical therapy versus sports med referral if no improvement over the next few weeks. F/u as originally scheduled. The patient voiced understanding and agreement to the  plan.   Mabel Mt [place], DO 11/30/2024  11:59 AM

## 2024-12-03 ENCOUNTER — Ambulatory Visit: Admitting: Sleep Medicine

## 2024-12-03 ENCOUNTER — Encounter: Payer: Self-pay | Admitting: Sleep Medicine

## 2024-12-03 ENCOUNTER — Other Ambulatory Visit: Payer: Self-pay | Admitting: Sleep Medicine

## 2024-12-03 VITALS — BP 128/80 | HR 64 | Temp 98.1°F | Ht 69.0 in | Wt 219.8 lb

## 2024-12-03 DIAGNOSIS — Z6832 Body mass index (BMI) 32.0-32.9, adult: Secondary | ICD-10-CM | POA: Diagnosis not present

## 2024-12-03 DIAGNOSIS — G4733 Obstructive sleep apnea (adult) (pediatric): Secondary | ICD-10-CM

## 2024-12-03 DIAGNOSIS — I10 Essential (primary) hypertension: Secondary | ICD-10-CM

## 2024-12-03 DIAGNOSIS — E669 Obesity, unspecified: Secondary | ICD-10-CM | POA: Diagnosis not present

## 2024-12-03 MED ORDER — ZEPBOUND 2.5 MG/0.5ML ~~LOC~~ SOAJ: 2.5000 mg | SUBCUTANEOUS | 1 refills | Status: AC

## 2024-12-03 NOTE — Progress Notes (Signed)
 "      Name:Sean Bowers MRN: [id number] DOB: May 30, 1956   CHIEF COMPLAINT:  CPAP F/U   HISTORY OF PRESENT ILLNESS:  Sean Bowers is a 69 y.o. w/ a h/o OSA, HTN and obesity who presents for CPAP f/u visit. Reports using CPAP therapy every night, which is confirmed by compliance data. He is currently using the Airfit F20 FFM, which is comfortable. Reports feeling more refreshed upon awakening with CPAP therapy.     EPWORTH SLEEP SCORE    01/20/2024    8:00 AM 03/23/2020    3:00 PM  Results of the Epworth flowsheet  Sitting and reading 0 1  Watching TV 1 1  Sitting, inactive in a public place (e.g. a theatre or a meeting) 1 0  As a passenger in a car for an hour without a break 0 1  Lying down to rest in the afternoon when circumstances permit 3 3  Sitting and talking to someone 0 0  Sitting quietly after a lunch without alcohol 0 0  In a car, while stopped for a few minutes in traffic 0 0  Total score 5 6    PAST MEDICAL HISTORY :   has a past medical history of Allergy, Arthritis, Asthma, Cancer (HCC) (2019), Diverticulitis (09/2016), Genital warts, History of chicken pox, History of hay fever, Hyperlipidemia (09/27/2016), Hypertension, and Sleep apnea.  has a past surgical history that includes Carpal tunnel release (Bilateral, 2006-2007). Prior to Admission medications   Medication Sig Start Date End Date Taking? Authorizing Provider  amLODipine  (NORVASC ) 5 MG tablet Take 5 mg by mouth daily. 02/19/24  Yes [provider]  ascorbic acid (VITAMIN C) 500 MG tablet Take 500 mg by mouth daily.   Yes [provider]  aspirin  81 MG tablet Take 81 mg by mouth daily.   Yes [provider]  azithromycin  (ZITHROMAX ) 250 MG tablet Take 2 tabs the first day and then 1 tab daily until you run out. 03/04/24  Yes Frann Mabel Mt, DO  carvedilol  (COREG ) 12.5 MG tablet Take 1 tablet (12.5 mg total) by mouth 2 (two) times daily with a meal. 01/26/24  Yes  Wendling, Mabel Mt, DO  Cholecalciferol (VITAMIN D -1000 MAX ST) 25 MCG (1000 UT) tablet Take 1,000 Units by mouth daily.   Yes [provider]  cyclobenzaprine  (FLEXERIL ) 10 MG tablet Take 1 tablet (10 mg total) by mouth 3 (three) times daily as needed for muscle spasms. 02/13/24  Yes Cyndi Shaver, PA-C  loratadine (CLARITIN) 10 MG tablet Take 10 mg by mouth daily.   Yes [provider]  losartan -hydrochlorothiazide  (HYZAAR) 100-25 MG tablet TAKE 1 TABLET EVERY DAY 05/30/23  Yes Frann Mabel Mt, DO  meloxicam  (MOBIC ) 15 MG tablet TAKE 1 TABLET EVERY DAY 12/19/23  Yes Frann Mabel Mt, DO  Multiple Vitamins-Minerals (MULTIVITAMIN MEN PO) Take by mouth.   Yes [provider]  potassium chloride  (KLOR-CON ) 10 MEQ tablet TAKE 1 TABLET EVERY DAY 03/17/24  Yes Frann Mabel Mt, DO  rosuvastatin  (CRESTOR ) 20 MG tablet Take 1 tablet by mouth once daily 06/09/23  Yes Wendling, Mabel Mt, DO  tamsulosin  (FLOMAX ) 0.4 MG CAPS capsule TAKE 1 CAPSULE EVERY DAY 05/30/23  Yes Frann Mabel Mt, DO  traZODone  (DESYREL ) 50 MG tablet Take 1 tablet (50 mg total) by mouth at bedtime. 06/09/23  Yes Frann Mabel Mt, DO  triamcinolone  cream (KENALOG ) 0.1 % Apply 1 Application topically 2 (two) times daily. 01/26/24  Yes Frann Mabel Mt, DO  Allergies  Allergen Reactions   Penicillins Swelling    Swelling of lips Has Sean Bowers had a PCN reaction causing immediate rash, facial/tongue/throat swelling, SOB or lightheadedness with hypotension: Yes Has Sean Bowers had a PCN reaction causing severe rash involving mucus membranes or skin necrosis: Yes Has Sean Bowers had a PCN reaction that required hospitalization: No Has Sean Bowers had a PCN reaction occurring within the last 10 years: No If all of the above answers are NO, then may proceed with Cephalosporin use.     FAMILY HISTORY:  family history includes ADD / ADHD in his son; Arthritis in his father  and mother; Breast cancer in his mother; Cancer in his father, maternal uncle, mother, paternal aunt, and paternal uncle; Diabetes in his mother; Heart disease in his father, mother, and paternal grandmother; Hyperlipidemia in his father and mother; Hypertension in his mother; Stroke in his father. SOCIAL HISTORY:  reports that he has never smoked. He has never been exposed to tobacco smoke. He has never used smokeless tobacco. He reports that he does not drink alcohol and does not use drugs.   Review of Systems:  Gen:  Denies  fever, sweats, chills weight loss  HEENT: Denies blurred vision, double vision, ear pain, eye pain, hearing loss, nose bleeds, sore throat Cardiac:  No dizziness, chest pain or heaviness, chest tightness,edema, No JVD Resp:   No cough, -sputum production, -shortness of breath,-wheezing, -hemoptysis,  Gi: Denies swallowing difficulty, stomach pain, nausea or vomiting, diarrhea, constipation, bowel incontinence Gu:  Denies bladder incontinence, burning urine Ext:   Denies Joint pain, stiffness or swelling Skin: Denies  skin rash, easy bruising or bleeding or hives Endoc:  Denies polyuria, polydipsia , polyphagia or weight change Psych:   Denies depression, insomnia or hallucinations  Other:  All other systems negative  VITAL SIGNS: BP 128/80   Pulse 64   Temp 98.1 F (36.7 C)   Ht 5' 9 (1.753 m)   Wt 219 lb 12.8 oz (99.7 kg)   SpO2 97%   BMI 32.46 kg/m    Physical Examination:   General Appearance: No distress  EYES PERRLA, EOM intact.   NECK Supple, No JVD Pulmonary: normal breath sounds, No wheezing.  CardiovascularNormal S1,S2.  No m/r/g.   Abdomen: Benign, Soft, non-tender. Skin:   warm, no rashes, no ecchymosis  Extremities: normal, no cyanosis, clubbing. Neuro:without focal findings,  speech normal  PSYCHIATRIC: Mood, affect within normal limits.   ASSESSMENT AND PLAN  OSA Sean Bowers is using and benefiting from CPAP therapy. Due to air leaks,  counseled Sean Bowers on proper mask fit. Discussed the consequences of untreated sleep apnea. Advised not to drive drowsy for safety of Sean Bowers and others. Will follow up in 3 months.  HTN Stable, on current management. Following with PCP.   Obesity Counseled Sean Bowers on diet and lifestyle modification. Will try Sean Bowers on Zepbound  and titrate as tolerated. Denies a family or personal history of thyroid medullary CA or pancreatitis.    Sean Bowers  satisfied with Plan of action and management. All questions answered  I spent a total of 31 minutes reviewing chart data, face-to-face evaluation with the Sean Bowers, counseling and coordination of care as detailed above.    Luchiano Viscomi, M.D.  Sleep Medicine [facility]        "

## 2024-12-03 NOTE — Patient Instructions (Signed)

## 2024-12-06 ENCOUNTER — Telehealth: Payer: Self-pay

## 2024-12-06 ENCOUNTER — Other Ambulatory Visit (HOSPITAL_COMMUNITY): Payer: Self-pay

## 2024-12-06 NOTE — Telephone Encounter (Signed)
 Pharmacy Patient Advocate Encounter   Received notification from RX Request Messages that prior authorization for Zepbound  2.5mg /0.3ml is required/requested.   Insurance verification completed.   The patient is insured through [employer].   Per test claim: PA required; PA submitted to above mentioned insurance via Latent Key/confirmation #/EOC [id number] Status is pending

## 2024-12-06 NOTE — Telephone Encounter (Signed)
 Pharmacy Patient Advocate Encounter  Received notification from [employer] that Prior Authorization for Zepbound  2.5mg /0.74ml has been APPROVED from 11/18/24 to 11/17/25. Ran test claim, Copay is $635.75. This test claim was processed through [facility]- copay amounts may vary at other pharmacies due to pharmacy/plan contracts, or as the patient moves through the different stages of their insurance plan.   PA #/Case ID/Reference #: [id number]  The copay includes a $250 deductible.

## 2024-12-10 NOTE — Telephone Encounter (Signed)
 I have notified the patient. Nothing further needed.

## 2024-12-28 ENCOUNTER — Other Ambulatory Visit

## 2025-01-23 ENCOUNTER — Other Ambulatory Visit

## 2025-07-21 ENCOUNTER — Ambulatory Visit
